# Patient Record
Sex: Female | Born: 1970
Health system: Southern US, Community
[De-identification: ages and names within clinical notes are randomized; demographics above are authoritative.]

## PROBLEM LIST (undated history)

## (undated) DIAGNOSIS — N871 Moderate cervical dysplasia: Secondary | ICD-10-CM

## (undated) DIAGNOSIS — H409 Unspecified glaucoma: Secondary | ICD-10-CM

## (undated) DIAGNOSIS — T783XXA Angioneurotic edema, initial encounter: Secondary | ICD-10-CM

## (undated) DIAGNOSIS — N951 Menopausal and female climacteric states: Secondary | ICD-10-CM

## (undated) DIAGNOSIS — T7840XA Allergy, unspecified, initial encounter: Secondary | ICD-10-CM

## (undated) DIAGNOSIS — T782XXA Anaphylactic shock, unspecified, initial encounter: Secondary | ICD-10-CM

## (undated) DIAGNOSIS — D219 Benign neoplasm of connective and other soft tissue, unspecified: Secondary | ICD-10-CM

## (undated) DIAGNOSIS — K219 Gastro-esophageal reflux disease without esophagitis: Secondary | ICD-10-CM

## (undated) DIAGNOSIS — E559 Vitamin D deficiency, unspecified: Secondary | ICD-10-CM

## (undated) DIAGNOSIS — L509 Urticaria, unspecified: Secondary | ICD-10-CM

## (undated) HISTORY — PX: DILATION AND CURETTAGE OF UTERUS: SHX78

## (undated) HISTORY — DX: Moderate cervical dysplasia: N87.1

## (undated) HISTORY — DX: Vitamin D deficiency, unspecified: E55.9

## (undated) HISTORY — DX: Unspecified glaucoma: H40.9

## (undated) HISTORY — DX: Urticaria, unspecified: L50.9

## (undated) HISTORY — DX: Anaphylactic shock, unspecified, initial encounter: T78.2XXA

## (undated) HISTORY — DX: Allergy, unspecified, initial encounter: T78.40XA

## (undated) HISTORY — DX: Angioneurotic edema, initial encounter: T78.3XXA

## (undated) HISTORY — DX: Benign neoplasm of connective and other soft tissue, unspecified: D21.9

## (undated) HISTORY — PX: OTHER SURGICAL HISTORY: SHX169

## (undated) HISTORY — DX: Menopausal and female climacteric states: N95.1

---

## 1997-11-26 ENCOUNTER — Inpatient Hospital Stay (HOSPITAL_COMMUNITY): Admission: AD | Admit: 1997-11-26 | Discharge: 1997-11-26 | Payer: Self-pay | Admitting: Obstetrics and Gynecology

## 1997-11-26 ENCOUNTER — Inpatient Hospital Stay (HOSPITAL_COMMUNITY): Admission: AD | Admit: 1997-11-26 | Discharge: 1997-11-29 | Payer: Self-pay | Admitting: Obstetrics and Gynecology

## 1999-10-02 HISTORY — PX: CERVICAL BIOPSY  W/ LOOP ELECTRODE EXCISION: SUR135

## 1999-11-07 ENCOUNTER — Encounter (INDEPENDENT_AMBULATORY_CARE_PROVIDER_SITE_OTHER): Payer: Self-pay | Admitting: Specialist

## 1999-11-07 ENCOUNTER — Other Ambulatory Visit: Admission: RE | Admit: 1999-11-07 | Discharge: 1999-11-07 | Payer: Self-pay | Admitting: *Deleted

## 2000-04-11 ENCOUNTER — Inpatient Hospital Stay (HOSPITAL_COMMUNITY): Admission: AD | Admit: 2000-04-11 | Discharge: 2000-04-13 | Payer: Self-pay | Admitting: Obstetrics and Gynecology

## 2000-07-23 ENCOUNTER — Encounter (INDEPENDENT_AMBULATORY_CARE_PROVIDER_SITE_OTHER): Payer: Self-pay | Admitting: Specialist

## 2000-07-23 ENCOUNTER — Other Ambulatory Visit: Admission: RE | Admit: 2000-07-23 | Discharge: 2000-07-23 | Payer: Self-pay | Admitting: *Deleted

## 2000-09-28 ENCOUNTER — Encounter (INDEPENDENT_AMBULATORY_CARE_PROVIDER_SITE_OTHER): Payer: Self-pay

## 2000-09-28 ENCOUNTER — Ambulatory Visit (HOSPITAL_COMMUNITY): Admission: RE | Admit: 2000-09-28 | Discharge: 2000-09-28 | Payer: Self-pay | Admitting: Obstetrics and Gynecology

## 2000-11-01 ENCOUNTER — Emergency Department (HOSPITAL_COMMUNITY): Admission: EM | Admit: 2000-11-01 | Discharge: 2000-11-01 | Payer: Self-pay | Admitting: Emergency Medicine

## 2001-02-26 ENCOUNTER — Other Ambulatory Visit: Admission: RE | Admit: 2001-02-26 | Discharge: 2001-02-26 | Payer: Self-pay | Admitting: Obstetrics and Gynecology

## 2001-08-20 ENCOUNTER — Other Ambulatory Visit: Admission: RE | Admit: 2001-08-20 | Discharge: 2001-08-20 | Payer: Self-pay | Admitting: Obstetrics and Gynecology

## 2002-05-04 ENCOUNTER — Other Ambulatory Visit: Admission: RE | Admit: 2002-05-04 | Discharge: 2002-05-04 | Payer: Self-pay | Admitting: Obstetrics and Gynecology

## 2002-12-09 ENCOUNTER — Other Ambulatory Visit: Admission: RE | Admit: 2002-12-09 | Discharge: 2002-12-09 | Payer: Self-pay | Admitting: Obstetrics and Gynecology

## 2003-03-09 ENCOUNTER — Other Ambulatory Visit: Admission: RE | Admit: 2003-03-09 | Discharge: 2003-03-09 | Payer: Self-pay | Admitting: Obstetrics and Gynecology

## 2003-06-09 ENCOUNTER — Other Ambulatory Visit: Admission: RE | Admit: 2003-06-09 | Discharge: 2003-06-09 | Payer: Self-pay | Admitting: Obstetrics and Gynecology

## 2003-07-02 ENCOUNTER — Encounter: Payer: Self-pay | Admitting: Internal Medicine

## 2003-07-02 ENCOUNTER — Encounter: Admission: RE | Admit: 2003-07-02 | Discharge: 2003-07-02 | Payer: Self-pay | Admitting: Internal Medicine

## 2003-09-23 ENCOUNTER — Emergency Department (HOSPITAL_COMMUNITY): Admission: AD | Admit: 2003-09-23 | Discharge: 2003-09-23 | Payer: Self-pay | Admitting: Emergency Medicine

## 2004-08-07 ENCOUNTER — Other Ambulatory Visit: Admission: RE | Admit: 2004-08-07 | Discharge: 2004-08-07 | Payer: Self-pay | Admitting: Obstetrics and Gynecology

## 2006-05-14 ENCOUNTER — Other Ambulatory Visit: Admission: RE | Admit: 2006-05-14 | Discharge: 2006-05-14 | Payer: Self-pay | Admitting: Obstetrics and Gynecology

## 2007-12-20 ENCOUNTER — Emergency Department (HOSPITAL_COMMUNITY): Admission: EM | Admit: 2007-12-20 | Discharge: 2007-12-20 | Payer: Self-pay | Admitting: Family Medicine

## 2009-03-28 ENCOUNTER — Encounter: Admission: RE | Admit: 2009-03-28 | Discharge: 2009-03-28 | Payer: Self-pay | Admitting: Internal Medicine

## 2009-08-09 ENCOUNTER — Ambulatory Visit: Payer: Self-pay | Admitting: Gynecology

## 2009-09-19 ENCOUNTER — Other Ambulatory Visit: Admission: RE | Admit: 2009-09-19 | Discharge: 2009-09-19 | Payer: Self-pay | Admitting: Obstetrics and Gynecology

## 2009-09-19 ENCOUNTER — Ambulatory Visit: Payer: Self-pay | Admitting: Obstetrics and Gynecology

## 2010-03-31 ENCOUNTER — Ambulatory Visit: Payer: Self-pay | Admitting: Gynecology

## 2010-04-25 ENCOUNTER — Ambulatory Visit: Payer: Self-pay | Admitting: Obstetrics and Gynecology

## 2010-06-08 ENCOUNTER — Emergency Department (HOSPITAL_COMMUNITY): Admission: EM | Admit: 2010-06-08 | Discharge: 2010-06-08 | Payer: Self-pay | Admitting: Emergency Medicine

## 2010-09-20 ENCOUNTER — Other Ambulatory Visit
Admission: RE | Admit: 2010-09-20 | Discharge: 2010-09-20 | Payer: Self-pay | Source: Home / Self Care | Admitting: Gynecology

## 2010-09-20 ENCOUNTER — Ambulatory Visit: Payer: Self-pay | Admitting: Gynecology

## 2010-10-22 ENCOUNTER — Encounter: Payer: Self-pay | Admitting: Internal Medicine

## 2010-12-14 LAB — CBC
MCV: 92.5 fL (ref 78.0–100.0)
Platelets: 190 10*3/uL (ref 150–400)
RBC: 4.16 MIL/uL (ref 3.87–5.11)
WBC: 6.1 10*3/uL (ref 4.0–10.5)

## 2010-12-14 LAB — POCT CARDIAC MARKERS
CKMB, poc: <1 ng/mL — ABNORMAL LOW (ref 1.0–8.0)
Myoglobin, poc: 41.1 ng/mL (ref 12–200)
Troponin i, poc: <0.05 ng/mL (ref 0.00–0.09)

## 2010-12-14 LAB — DIFFERENTIAL
Basophils Absolute: 0 10*3/uL (ref 0.0–0.1)
Eosinophils Relative: 1 % (ref 0–5)
Lymphocytes Relative: 57 % — ABNORMAL HIGH (ref 12–46)
Lymphs Abs: 3.5 10*3/uL (ref 0.7–4.0)
Neutro Abs: 2.2 10*3/uL (ref 1.7–7.7)
Neutrophils Relative %: 37 % — ABNORMAL LOW (ref 43–77)

## 2010-12-14 LAB — BASIC METABOLIC PANEL
Chloride: 105 mEq/L (ref 96–112)
Creatinine, Ser: 0.7 mg/dL (ref 0.4–1.2)
GFR calc Af Amer: >60 mL/min (ref >60)
GFR calc non Af Amer: >60 mL/min (ref >60)

## 2011-01-27 ENCOUNTER — Inpatient Hospital Stay (INDEPENDENT_AMBULATORY_CARE_PROVIDER_SITE_OTHER)
Admission: RE | Admit: 2011-01-27 | Discharge: 2011-01-27 | Disposition: A | Discharge status: Home / Self Care | Payer: BC Managed Care – PPO | Source: Ambulatory Visit | Attending: Emergency Medicine | Admitting: Emergency Medicine

## 2011-01-27 DIAGNOSIS — R071 Chest pain on breathing: Secondary | ICD-10-CM

## 2011-01-27 LAB — POCT I-STAT, CHEM 8
Calcium, Ion: 1.17 mmol/L (ref 1.12–1.32)
Hemoglobin: 15 g/dL (ref 12.0–15.0)
Sodium: 141 mEq/L (ref 135–145)
TCO2: 28 mmol/L (ref 0–100)

## 2011-02-16 NOTE — H&P (Signed)
[facility]  Patient:    Ashley Archer, Ashley Archer                        MRN: [id number] Adm. Date:  04/11/00 Attending:  Georgina Peer, M.D. Dictator:   Wynelle Bourgeois, C.N.M.                         History and Physical  SUBJECTIVE:                       This is a 40 year old, G3, P1-0-1-1, at 36-4/7 weeks who presented to the office today with complaints of irregular uterine contractions every 8 to 10 minutes.  She reports positive fetal movement and denies any bleeding or leaking.  Her cervical exam in the office was 5 to 6 cm, 80% effaced, -1 to 0 station, vertex presentation per Vance Gather Duplantis, C.N.M..  Her pregnancy has been remarkable for 1) History of 16-week SAB, 2) family history of Downs syndrome, 3) enlarged thyroid with a low first trimester TSH and a normal second trimester TSH, 4) low-grade SIL Pap followed by a colposcopy showing a high-grade SIL Pap, 5) group B strep negative.  She appears comfortable and declines analgesia at this time.  PRENATAL LABORATORY DATA:         Hemoglobin 12.8, hematocrit 39.5, platelets 238.  Blood type B positive.  Antibody screen negative.  Sickle cell negative. RPR nonreactive.  Rubella immune.  HBsAg negative.  Pap LGSIL.  Gonorrhea negative.  Chlamydia negative.  Glucose challenge within normal limits.  First trimester TSH 0.064.  T3 normal, T4 normal, and TSH on Feb 06, 2000, was 0.376.  Group B Strep negative.  OBSTETRICAL HISTORY:              Remarkable for spontaneous vaginal delivery in February 1999 of a viable female infant, 6 pounds 12 ounces, at [redacted] weeks gestation after 9 hours of labor with no complications by Nigel Bridgeman, C.N.M. In [place] 1995, she had a 16-week spontaneous miscarriage followed by a D&C at [facility] with no complications.  PAST MEDICAL HISTORY:             Remarkable for abnormal Pap smear in 1994 and again in 2001.  She has occasional yeast infections.  She has an  enlarged thyroid.  PAST SURGICAL HISTORY:            Remarkable for D&C in 1995, surgery on her thumb in 1989.  FAMILY HISTORY:                   Remarkable for her mother maternal grandmother, and maternal aunt x 2 with hypertension, a maternal aunt with insulin-dependent diabetes, and  a maternal aunt with migraines.  GENETIC HISTORY:                  Remarkable for a maternal first and third cousin with Downs syndrome.  SOCIAL HISTORY:                   The patient is married to [place].  She lives with him and their son, Meta Hatchet.  She works as an Advertising account planner, and Judie Grieve is involved and supportive.  She denies alcohol, tobacco, or drug use.  REVIEW OF SYSTEMS:                Essentially noncontributory other than reproductive system as commented  on earlier.  PHYSICAL EXAMINATION:  VITAL SIGNS:                      Stable.  She is afebrile.  SKIN:                             Within normal limits.  NEUROLOGIC:                       Within normal limits.  HEENT:                            Within normal limits.  NECK:                             Thyroid slightly enlarged 1 cm bilaterally.  BREASTS:                          Soft, nontender, no masses.  CARDIOVASCULAR:                   Regular rate and rhythm, no murmur.  RESPIRATORY:                      Clear to auscultation bilaterally.  ABDOMEN:                          Gravid at 36 cm, vertex per [place].  Fetal heart rate reactive with positive accelerations and average variability and no decelerations.  Uterine contractions every 2 minutes, moderate in strength.  CERVIX:                           Exam at this time is 6+ cm, completely effaced, 0 station, vertex presentation.  EXTREMITIES:                      Within normal limits.  ASSESSMENT:                       1. Intrauterine pregnancy at 36-4/7 weeks.                                   2. Active labor.                                   3. Group B  Streptococcus negative.  PLAN:                             1. Admit to birthing suite.                                   2. Dr. Elliot Gault notified.                                   3. Routine CNM orders.  4. Anticipate spontaneous vaginal delivery                                      this evening. DD:  04/11/00 TD:  04/11/00 Job: [id number]

## 2011-02-16 NOTE — Op Note (Signed)
[facility]  Patient:    Ashley Archer, Ashley Archer                      MRN: [id number] Proc. Date: 09/28/00 Adm. Date:  [id number] Attending:  Leonard Schwartz                           Operative Report  PREOPERATIVE DIAGNOSIS:       Cervical intraepithelial neoplasia II.  POSTOPERATIVE DIAGNOSIS:      Cervical intraepithelial neoplasia II.  PROCEDURE:                    Loop electrosurgical excision procedure.  SURGEON:                      Janine Limbo, M.D.  ANESTHESIA:                   Local.  INDICATIONS:                  The patient is a 40 year old with CIN-II.  She understands the indications for her procedure and she accepts the associated risks.  FINDINGS:                     The patient had white epithelium on the cervix.  DESCRIPTION OF PROCEDURE:     The patient was taken to the operating room, where she was placed in the lithotomy position.  The vagina and cervix were painted with acetic acid.  The cervix was injected with a diluted solution of 0.5% Marcaine, Pitressin and normal saline.  Using the green LEEP electrode, the lesion was outlined and then a cone-like specimen was obtained.  The patient tolerated her procedure well.  Hemostasis was achieved using the ball apparatus.  The periphery of the cone specimen was then cauterized using the ball apparatus.  Hemostasis was adequate.  The patient was taken to the recovery room.  Estimated blood loss was less than 5 cc.  FOLLOW-UP INSTRUCTIONS:       The patient will return to see Dr. Stefano Gaul in 2-3 weeks for follow-up examination.  She was given a copy of the postoperative instruction sheet as prepared by the [facility] for patients who had have a dilatation and curettage, but that was then modified for a LEEP procedure.  She will take Advil 600 mg q.6h. p.r.n. pain. DD:  09/28/00 TD:  09/28/00 Job: [id number]

## 2011-06-14 ENCOUNTER — Other Ambulatory Visit: Payer: Self-pay | Admitting: Internal Medicine

## 2011-06-14 DIAGNOSIS — Z1231 Encounter for screening mammogram for malignant neoplasm of breast: Secondary | ICD-10-CM

## 2011-07-18 ENCOUNTER — Ambulatory Visit
Admission: RE | Admit: 2011-07-18 | Discharge: 2011-07-18 | Disposition: A | Discharge status: Home / Self Care | Payer: BC Managed Care – PPO | Source: Ambulatory Visit | Attending: Internal Medicine | Admitting: Internal Medicine

## 2011-07-18 DIAGNOSIS — Z1231 Encounter for screening mammogram for malignant neoplasm of breast: Secondary | ICD-10-CM

## 2011-09-19 ENCOUNTER — Encounter: Payer: Self-pay | Admitting: Gynecology

## 2011-09-19 DIAGNOSIS — N871 Moderate cervical dysplasia: Secondary | ICD-10-CM | POA: Insufficient documentation

## 2011-09-19 DIAGNOSIS — D219 Benign neoplasm of connective and other soft tissue, unspecified: Secondary | ICD-10-CM | POA: Insufficient documentation

## 2011-09-27 ENCOUNTER — Encounter: Payer: Self-pay | Admitting: Obstetrics and Gynecology

## 2011-09-27 ENCOUNTER — Ambulatory Visit (INDEPENDENT_AMBULATORY_CARE_PROVIDER_SITE_OTHER): Payer: BC Managed Care – PPO | Admitting: Obstetrics and Gynecology

## 2011-09-27 ENCOUNTER — Other Ambulatory Visit (HOSPITAL_COMMUNITY)
Admission: RE | Admit: 2011-09-27 | Discharge: 2011-09-27 | Disposition: A | Discharge status: Home / Self Care | Payer: BC Managed Care – PPO | Source: Ambulatory Visit | Attending: Obstetrics and Gynecology | Admitting: Obstetrics and Gynecology

## 2011-09-27 VITALS — BP 126/84 | Ht 61.75 in | Wt 145.0 lb

## 2011-09-27 DIAGNOSIS — Z23 Encounter for immunization: Secondary | ICD-10-CM

## 2011-09-27 DIAGNOSIS — Z01419 Encounter for gynecological examination (general) (routine) without abnormal findings: Secondary | ICD-10-CM

## 2011-09-27 NOTE — Progress Notes (Signed)
Patient came to see me today for her annual GYN exam. She is done her lab work from her PCP. She had a mammogram in October. She is contracepting with a condom but does not like it. She was not happy on birth control pills. She would like her husband to get a vasectomy but he has refused. She is having very heavy periods with cramps with her fibroids. She is having no pelvic pain. She is having no abnormal bleeding.  Physical examination: Beola Cord present. HEENT within normal limits. Neck: Thyroid not large. No masses. Supraclavicular nodes: not enlarged. Breasts: Examined in both sitting midline position. No skin changes and no masses. Abdomen: Soft no guarding rebound or masses or hernia. Pelvic: External: Within normal limits. BUS: Within normal limits. Vaginal:within normal limits. Good estrogen effect. No evidence of cystocele rectocele or enterocele. Cervix: clean. Uterus: enlarged by fibroids to 8 weeks size. Adnexa: No masses. Rectovaginal exam: Confirmatory and negative. Extremities: Within normal limits.  Assessment: #1. Menorrhagia #2. Fibroids #3. Need for contraception.  Plan: Encouraged her to try a Mirena IUD. Discussed Implanon or lap tubal. Patient will consider and inform if she would like to be approved for Mirena IUD. Also discussed endometrial ablation but obviously that we'll not solve the issue with contraception.

## 2011-10-16 ENCOUNTER — Encounter: Payer: Self-pay | Admitting: *Deleted

## 2011-10-16 NOTE — Progress Notes (Signed)
Patient ID: Ashley Archer, female   DOB: 11-Jun-1971, 41 y.o.   MRN: [id number] Pt informed of recent pap result in dec 2012.

## 2011-10-24 ENCOUNTER — Other Ambulatory Visit: Payer: Self-pay | Admitting: Internal Medicine

## 2011-10-24 DIAGNOSIS — R109 Unspecified abdominal pain: Secondary | ICD-10-CM

## 2011-10-25 ENCOUNTER — Other Ambulatory Visit: Payer: BC Managed Care – PPO

## 2011-10-25 ENCOUNTER — Ambulatory Visit
Admission: RE | Admit: 2011-10-25 | Discharge: 2011-10-25 | Disposition: A | Discharge status: Home / Self Care | Payer: BC Managed Care – PPO | Source: Ambulatory Visit | Attending: Internal Medicine | Admitting: Internal Medicine

## 2011-10-25 DIAGNOSIS — R109 Unspecified abdominal pain: Secondary | ICD-10-CM

## 2012-06-26 ENCOUNTER — Other Ambulatory Visit: Payer: Self-pay | Admitting: Internal Medicine

## 2012-06-26 DIAGNOSIS — Z1231 Encounter for screening mammogram for malignant neoplasm of breast: Secondary | ICD-10-CM

## 2012-07-29 ENCOUNTER — Telehealth: Payer: Self-pay | Admitting: *Deleted

## 2012-07-29 NOTE — Telephone Encounter (Signed)
Pt calling c/o bump on lower back area at tailbone area. Pt said it is tender, was a bump, but now bump is gone and still feels tender. OV offered to pt she will watch for now. No discharge for area, no is this area red. Pt will follow up as needed.

## 2012-07-30 ENCOUNTER — Encounter: Payer: Self-pay | Admitting: Gynecology

## 2012-07-30 ENCOUNTER — Ambulatory Visit (INDEPENDENT_AMBULATORY_CARE_PROVIDER_SITE_OTHER): Payer: BC Managed Care – PPO | Admitting: Gynecology

## 2012-07-30 DIAGNOSIS — Z23 Encounter for immunization: Secondary | ICD-10-CM

## 2012-07-30 DIAGNOSIS — L0591 Pilonidal cyst without abscess: Secondary | ICD-10-CM

## 2012-07-30 MED ORDER — FLUCONAZOLE 150 MG PO TABS: 150.0000 mg | ORAL_TABLET | Freq: Once | ORAL | Status: DC

## 2012-07-30 MED ORDER — DOXYCYCLINE HYCLATE 50 MG PO CAPS: 50.0000 mg | ORAL_CAPSULE | Freq: Two times a day (BID) | ORAL | Status: DC

## 2012-07-30 NOTE — Patient Instructions (Signed)
Take antibiotics as prescribed.  Heat to the area.  Call if persists, worsens or recurs for referral to general surgeon

## 2012-07-30 NOTE — Progress Notes (Signed)
Patient presents with a several day to one week history of tenderness and swelling at the upper gluteal fold. No history of this before. Does seem to be getting better the last day or so. Has not drained. No constitutional symptoms such as fever chills. Is having a little bit of vaginal itching and think she has a yeast infection.  Exam with Sherrilyn Rist assistant Tender 1 cm subcutaneous nodule upper mid gluteal fold overlying sacrum with small sebaceous opening visualized.  No drainage or skin ulcerations or erythema.  Assessment and plan: Probable small pilonidal cyst. Getting better historically. Will cover with Vibramycin 100 mg twice a day x7 days antibiotics and recommend heat to the area. Will also cover for yeast with Diflucan #2 one now and at the end of the Vibramycin. Possible sebaceous cyst. If persists, worsens or becomes recurrent patient knows to call me and I will refer to general surgeon for evaluation and possible excision.

## 2012-07-30 NOTE — Addendum Note (Signed)
Addended by: Richardson Chiquito on: 07/30/2012 10:40 AM   Modules accepted: Orders

## 2012-08-04 ENCOUNTER — Ambulatory Visit
Admission: RE | Admit: 2012-08-04 | Discharge: 2012-08-04 | Disposition: A | Discharge status: Home / Self Care | Payer: BC Managed Care – PPO | Source: Ambulatory Visit | Attending: Internal Medicine | Admitting: Internal Medicine

## 2012-08-04 DIAGNOSIS — Z1231 Encounter for screening mammogram for malignant neoplasm of breast: Secondary | ICD-10-CM

## 2012-08-12 ENCOUNTER — Ambulatory Visit: Payer: BC Managed Care – PPO

## 2012-09-29 ENCOUNTER — Ambulatory Visit (INDEPENDENT_AMBULATORY_CARE_PROVIDER_SITE_OTHER): Payer: BC Managed Care – PPO | Admitting: Obstetrics and Gynecology

## 2012-09-29 ENCOUNTER — Other Ambulatory Visit (HOSPITAL_COMMUNITY)
Admission: RE | Admit: 2012-09-29 | Discharge: 2012-09-29 | Disposition: A | Discharge status: Home / Self Care | Payer: BC Managed Care – PPO | Source: Ambulatory Visit | Attending: Obstetrics and Gynecology | Admitting: Obstetrics and Gynecology

## 2012-09-29 ENCOUNTER — Encounter: Payer: Self-pay | Admitting: Obstetrics and Gynecology

## 2012-09-29 VITALS — BP 112/74 | Ht 62.0 in | Wt 128.0 lb

## 2012-09-29 DIAGNOSIS — Z01419 Encounter for gynecological examination (general) (routine) without abnormal findings: Secondary | ICD-10-CM

## 2012-09-29 NOTE — Patient Instructions (Signed)
Continue yearly mammograms 

## 2012-09-29 NOTE — Progress Notes (Signed)
The patient came to see me today for her annual GYN exam. She is using condoms for birth control. We have discussed other methods in the past and she is still deciding. One of the methods was an IUD especially with her heavy cycles. She is having no abnormal bleeding or pelvic pain. She does her lab through her PCP. She had a LEEP in 2001 for CIN 2. She has had normal yearly Pap smears since then. Her last Pap smear was 2012. She has a history of a small fibroid uterus.  Physical examination:Ashley Archer present HEENT within normal limits. Neck: Thyroid not large. No masses. Supraclavicular nodes: not enlarged. Breasts: Examined in both sitting and lying  position. No skin changes and no masses. Abdomen: Soft no guarding rebound or masses or hernia. Pelvic: External: Within normal limits. BUS: Within normal limits. Vaginal:within normal limits. Good estrogen effect. No evidence of cystocele rectocele or enterocele. Cervix: clean. Uterus: 8 week fibroid uterus. Adnexa: No masses. Rectovaginal exam: Confirmatory and negative. Extremities: Within normal limits.  Assessment: #1. Fibroid uterus with heavy cycles. #2. History of CIN-2 with a LEEP in 2001.  Plan: Continue yearly mammograms.The new Pap smear guidelines were discussed with the patient. Patient requested Pap and it was done. She will inform when necessary new method of birth control.

## 2012-09-30 LAB — URINALYSIS W MICROSCOPIC + REFLEX CULTURE
Hgb urine dipstick: NEGATIVE
Leukocytes, UA: NEGATIVE
Protein, ur: NEGATIVE mg/dL
Squamous Epithelial / LPF: NONE SEEN
Urobilinogen, UA: 0.2 mg/dL (ref 0.0–1.0)

## 2012-12-29 ENCOUNTER — Encounter: Payer: Self-pay | Admitting: Gastroenterology

## 2013-01-26 ENCOUNTER — Ambulatory Visit: Payer: BC Managed Care – PPO | Admitting: Gastroenterology

## 2013-06-08 ENCOUNTER — Encounter (HOSPITAL_COMMUNITY): Payer: Self-pay | Admitting: Emergency Medicine

## 2013-06-08 ENCOUNTER — Emergency Department (HOSPITAL_COMMUNITY)
Admission: EM | Admit: 2013-06-08 | Discharge: 2013-06-08 | Disposition: A | Discharge status: Home / Self Care | Payer: BC Managed Care – PPO | Attending: Emergency Medicine | Admitting: Emergency Medicine

## 2013-06-08 DIAGNOSIS — Z3202 Encounter for pregnancy test, result negative: Secondary | ICD-10-CM | POA: Insufficient documentation

## 2013-06-08 DIAGNOSIS — R1013 Epigastric pain: Secondary | ICD-10-CM

## 2013-06-08 DIAGNOSIS — Z8542 Personal history of malignant neoplasm of other parts of uterus: Secondary | ICD-10-CM | POA: Insufficient documentation

## 2013-06-08 DIAGNOSIS — Z79899 Other long term (current) drug therapy: Secondary | ICD-10-CM | POA: Insufficient documentation

## 2013-06-08 DIAGNOSIS — K219 Gastro-esophageal reflux disease without esophagitis: Secondary | ICD-10-CM | POA: Insufficient documentation

## 2013-06-08 HISTORY — DX: Gastro-esophageal reflux disease without esophagitis: K21.9

## 2013-06-08 LAB — COMPREHENSIVE METABOLIC PANEL
ALT: 16 U/L (ref 0–35)
Alkaline Phosphatase: 44 U/L (ref 39–117)
BUN: 18 mg/dL (ref 6–23)
CO2: 26 mEq/L (ref 19–32)
GFR calc Af Amer: >90 mL/min (ref >90)
GFR calc non Af Amer: 86 mL/min — ABNORMAL LOW (ref >90)
Glucose, Bld: 101 mg/dL — ABNORMAL HIGH (ref 70–99)
Potassium: 3.6 mEq/L (ref 3.5–5.1)
Sodium: 137 mEq/L (ref 135–145)
Total Bilirubin: 0.6 mg/dL (ref 0.3–1.2)

## 2013-06-08 LAB — CBC WITH DIFFERENTIAL/PLATELET
Eosinophils Absolute: 0.1 10*3/uL (ref 0.0–0.7)
Hemoglobin: 14 g/dL (ref 12.0–15.0)
Lymphocytes Relative: 53 % — ABNORMAL HIGH (ref 12–46)
Lymphs Abs: 3.1 10*3/uL (ref 0.7–4.0)
MCH: 30.9 pg (ref 26.0–34.0)
MCV: 90.9 fL (ref 78.0–100.0)
Monocytes Relative: 8 % (ref 3–12)
Neutrophils Relative %: 37 % — ABNORMAL LOW (ref 43–77)
Platelets: 188 10*3/uL (ref 150–400)
RBC: 4.53 MIL/uL (ref 3.87–5.11)
WBC: 5.8 10*3/uL (ref 4.0–10.5)

## 2013-06-08 LAB — URINALYSIS, ROUTINE W REFLEX MICROSCOPIC
Hgb urine dipstick: NEGATIVE
Nitrite: NEGATIVE
Specific Gravity, Urine: 1.021 (ref 1.005–1.030)
Urobilinogen, UA: 0.2 mg/dL (ref 0.0–1.0)
pH: 6 (ref 5.0–8.0)

## 2013-06-08 LAB — POCT I-STAT TROPONIN I: Troponin i, poc: 0 ng/mL (ref 0.00–0.08)

## 2013-06-08 LAB — POCT PREGNANCY, URINE: Preg Test, Ur: NEGATIVE

## 2013-06-08 LAB — LIPASE, BLOOD: Lipase: 25 U/L (ref 11–59)

## 2013-06-08 MED ORDER — ONDANSETRON HCL 4 MG/2ML IJ SOLN
4.0000 mg | Freq: Once | INTRAMUSCULAR | Status: AC
  Administered 2013-06-08: 4 mg via INTRAVENOUS
  Filled 2013-06-08: qty 2

## 2013-06-08 MED ORDER — RANITIDINE HCL 150 MG PO CAPS: 150.0000 mg | ORAL_CAPSULE | Freq: Every day | ORAL | Status: DC

## 2013-06-08 NOTE — ED Provider Notes (Signed)
CSN: [id number]     Arrival date & time 06/08/13  0436 History   First MD Initiated Contact with Patient 06/08/13 [phone]     Chief Complaint  Patient presents with  . Abdominal Pain   (Consider location/radiation/quality/duration/timing/severity/associated sxs/prior Treatment) Patient is a 42 y.o. female presenting with abdominal pain.  Abdominal Pain Pain location:  Epigastric Pain quality: aching and shooting   Pain radiates to:  Back Pain severity:  Moderate Onset quality:  Unable to specify Duration:  1 week Timing:  Constant Progression:  Waxing and waning Relieved by:  Nothing Worsened by:  Nothing tried Ineffective treatments:  None tried Associated symptoms: diarrhea and nausea   Associated symptoms: no anorexia, no chest pain, no chills, no constipation, no cough, no dysuria, no fatigue, no fever, no shortness of breath, no sore throat, no vaginal bleeding, no vaginal discharge and no vomiting   Diarrhea:    Quality:  Semi-solid   Number of occurrences:  1   Severity:  Mild   Diarrhea duration: yesterday.   Timing:  Rare   Progression:  Resolved Risk factors: no alcohol abuse, no aspirin use, not elderly, has not had multiple surgeries, no NSAID use, not obese, not pregnant and no recent hospitalization     Past Medical History  Diagnosis Date  . CIN II (cervical intraepithelial neoplasia II)   . Fibroid   . GERD (gastroesophageal reflux disease)    Past Surgical History  Procedure Laterality Date  . Dilation and curettage of uterus    . Thumb surg    . Cervical biopsy  w/ loop electrode excision  2001    CIN ll   Family History  Problem Relation Age of Onset  . Hypertension Mother   . Cancer Mother 38    cervical  . Pancreatic cancer Father   . Cancer Father 68    pancreatic  . Diabetes Maternal Aunt   . Diabetes Maternal Grandmother    History  Substance Use Topics  . Smoking status: Never Smoker   . Smokeless tobacco: Never Used  . Alcohol Use: No    OB History   Grav Para Term Preterm Abortions TAB SAB Ect Mult Living   3 2 2  1  1   2      Review of Systems  Constitutional: Negative for fever, chills, diaphoresis, activity change, appetite change and fatigue.  HENT: Negative for congestion, sore throat, facial swelling, rhinorrhea, neck pain and neck stiffness.   Eyes: Negative for photophobia and discharge.  Respiratory: Negative for cough, chest tightness and shortness of breath.   Cardiovascular: Negative for chest pain, palpitations and leg swelling.  Gastrointestinal: Positive for nausea, abdominal pain and diarrhea. Negative for vomiting, constipation and anorexia.  Endocrine: Negative for polydipsia and polyuria.  Genitourinary: Negative for dysuria, frequency, vaginal bleeding, vaginal discharge, difficulty urinating and pelvic pain.  Musculoskeletal: Negative for back pain and arthralgias.  Skin: Negative for color change and wound.  Allergic/Immunologic: Negative for immunocompromised state.  Neurological: Negative for facial asymmetry, weakness, numbness and headaches.  Hematological: Does not bruise/bleed easily.  Psychiatric/Behavioral: Negative for confusion and agitation.    Allergies  Hydrocodone  Home Medications   Current Outpatient Rx  Name  Route  Sig  Dispense  Refill  . bismuth subsalicylate (PEPTO BISMOL) 262 MG/15ML suspension   Oral   Take 15 mLs by mouth every 6 (six) hours as needed for indigestion.         Marland Kitchen omeprazole (PRILOSEC) 20 MG capsule  Oral   Take 20 mg by mouth daily.         . ranitidine (ZANTAC) 150 MG capsule   Oral   Take 1 capsule (150 mg total) by mouth daily.   30 capsule   0    BP 116/71  Pulse 54  Temp(Src) 97.8 F (36.6 C) (Oral)  Resp 16  SpO2 100%  LMP 05/30/2013 Physical Exam  Constitutional: She is oriented to person, place, and time. She appears well-developed and well-nourished. No distress.  HENT:  Head: Normocephalic and atraumatic.   Mouth/Throat: No oropharyngeal exudate.  Eyes: Pupils are equal, round, and reactive to light.  Neck: Normal range of motion. Neck supple.  Cardiovascular: Normal rate, regular rhythm and normal heart sounds.  Exam reveals no gallop and no friction rub.   No murmur heard. Pulmonary/Chest: Effort normal and breath sounds normal. No respiratory distress. She has no wheezes. She has no rales.  Abdominal: Soft. Bowel sounds are normal. She exhibits no distension and no mass. There is tenderness (mild) in the epigastric area. There is no rigidity, no rebound and no guarding.  Musculoskeletal: Normal range of motion. She exhibits no edema and no tenderness.  Neurological: She is alert and oriented to person, place, and time.  Skin: Skin is warm and dry.  Psychiatric: She has a normal mood and affect.    ED Course  Procedures (including critical care time) Labs Review Labs Reviewed  CBC WITH DIFFERENTIAL - Abnormal; Notable for the following:    Neutrophils Relative % 37 (*)    Lymphocytes Relative 53 (*)    All other components within normal limits  COMPREHENSIVE METABOLIC PANEL - Abnormal; Notable for the following:    Glucose, Bld 101 (*)    GFR calc non Af Amer 86 (*)    All other components within normal limits  LIPASE, BLOOD  URINALYSIS, ROUTINE W REFLEX MICROSCOPIC  POCT PREGNANCY, URINE  POCT I-STAT TROPONIN I   Imaging Review No results found.   Date: 06/08/2013  Rate: 51  Rhythm: sinus bradycardia  QRS Axis: normal  Intervals: normal  ST/T Wave abnormalities: normal  Conduction Disutrbances:none  Narrative Interpretation:   Old EKG Reviewed: unchanged    MDM   1. Epigastric abdominal pain    Pt is a 42 y.o. female with Pmhx as above who presents with 4 days of epigastric pain w/ radiation to the back, worse early this morning with assoc nausea, no vom, fever, one episode of d/a yesterday. Unchanged w/ food.  Pt feels somewhat improved after zofran, has benign  abdominal exam, CBC, CMP, lipase unremarkable.  I added EKG, itrop trop given sex, and were also unremarkable.  I doubt cholecystitis, SBO, appendicitis, ACS.  Pt has have gastritis/PUD.  Will recommend addition of PPI.  Return precautions given for new or worsening symptoms, she can otherwise f/u with her PCP.   1. Epigastric abdominal pain         Shanna Cisco, MD 06/08/13 2053

## 2013-06-08 NOTE — ED Notes (Signed)
Pt. woke up from sleep with upper abdominal pain this morning , denies nausea or vomitting . Diarrhea last night . Pain radiating to mid /lower back , denies urinary discomfort , no fever or chills.

## 2013-07-08 ENCOUNTER — Other Ambulatory Visit: Payer: Self-pay

## 2013-07-08 DIAGNOSIS — Z1231 Encounter for screening mammogram for malignant neoplasm of breast: Secondary | ICD-10-CM

## 2013-09-02 ENCOUNTER — Ambulatory Visit
Admission: RE | Admit: 2013-09-02 | Discharge: 2013-09-02 | Disposition: A | Discharge status: Home / Self Care | Payer: BC Managed Care – PPO | Source: Ambulatory Visit

## 2013-09-02 DIAGNOSIS — Z1231 Encounter for screening mammogram for malignant neoplasm of breast: Secondary | ICD-10-CM

## 2013-10-15 ENCOUNTER — Other Ambulatory Visit (HOSPITAL_COMMUNITY): Payer: Self-pay | Admitting: Obstetrics and Gynecology

## 2013-10-15 DIAGNOSIS — E049 Nontoxic goiter, unspecified: Secondary | ICD-10-CM

## 2013-10-22 ENCOUNTER — Ambulatory Visit (HOSPITAL_COMMUNITY): Admission: RE | Admit: 2013-10-22 | Payer: BC Managed Care – PPO | Source: Ambulatory Visit

## 2014-07-27 ENCOUNTER — Other Ambulatory Visit: Payer: Self-pay

## 2014-07-27 DIAGNOSIS — Z1231 Encounter for screening mammogram for malignant neoplasm of breast: Secondary | ICD-10-CM

## 2014-08-02 ENCOUNTER — Encounter (HOSPITAL_COMMUNITY): Payer: Self-pay | Admitting: Emergency Medicine

## 2014-09-06 ENCOUNTER — Ambulatory Visit
Admission: RE | Admit: 2014-09-06 | Discharge: 2014-09-06 | Disposition: A | Discharge status: Home / Self Care | Payer: BC Managed Care – PPO | Source: Ambulatory Visit

## 2014-09-06 DIAGNOSIS — Z1231 Encounter for screening mammogram for malignant neoplasm of breast: Secondary | ICD-10-CM

## 2014-10-19 ENCOUNTER — Other Ambulatory Visit: Payer: Self-pay | Admitting: Internal Medicine

## 2014-10-19 DIAGNOSIS — R51 Headache: Principal | ICD-10-CM

## 2014-10-19 DIAGNOSIS — R519 Headache, unspecified: Secondary | ICD-10-CM

## 2015-06-27 ENCOUNTER — Other Ambulatory Visit: Payer: Self-pay | Admitting: Internal Medicine

## 2015-06-27 DIAGNOSIS — E010 Iodine-deficiency related diffuse (endemic) goiter: Secondary | ICD-10-CM

## 2015-07-01 ENCOUNTER — Ambulatory Visit
Admission: RE | Admit: 2015-07-01 | Discharge: 2015-07-01 | Disposition: A | Discharge status: Home / Self Care | Payer: 59 | Source: Ambulatory Visit | Attending: Internal Medicine | Admitting: Internal Medicine

## 2015-07-01 DIAGNOSIS — E010 Iodine-deficiency related diffuse (endemic) goiter: Secondary | ICD-10-CM

## 2016-02-17 ENCOUNTER — Other Ambulatory Visit: Payer: Self-pay

## 2016-02-17 DIAGNOSIS — Z1231 Encounter for screening mammogram for malignant neoplasm of breast: Secondary | ICD-10-CM

## 2016-03-02 ENCOUNTER — Ambulatory Visit: Payer: 59

## 2016-04-06 ENCOUNTER — Ambulatory Visit: Payer: 59

## 2016-06-15 ENCOUNTER — Ambulatory Visit: Payer: 59

## 2016-06-15 ENCOUNTER — Ambulatory Visit
Admission: RE | Admit: 2016-06-15 | Discharge: 2016-06-15 | Disposition: A | Discharge status: Home / Self Care | Payer: 59 | Source: Ambulatory Visit

## 2016-06-15 DIAGNOSIS — Z1231 Encounter for screening mammogram for malignant neoplasm of breast: Secondary | ICD-10-CM

## 2016-07-25 ENCOUNTER — Ambulatory Visit (HOSPITAL_COMMUNITY)
Admission: EM | Admit: 2016-07-25 | Discharge: 2016-07-25 | Disposition: A | Discharge status: Nursing Home (Includes ICF) | Payer: 59

## 2016-07-25 ENCOUNTER — Emergency Department (HOSPITAL_COMMUNITY): Payer: 59

## 2016-07-25 ENCOUNTER — Encounter (HOSPITAL_COMMUNITY): Payer: Self-pay | Admitting: Emergency Medicine

## 2016-07-25 ENCOUNTER — Emergency Department (HOSPITAL_COMMUNITY)
Admission: EM | Admit: 2016-07-25 | Discharge: 2016-07-25 | Disposition: A | Discharge status: Home / Self Care | Payer: 59 | Attending: Emergency Medicine | Admitting: Emergency Medicine

## 2016-07-25 DIAGNOSIS — R079 Chest pain, unspecified: Secondary | ICD-10-CM | POA: Diagnosis present

## 2016-07-25 DIAGNOSIS — Y999 Unspecified external cause status: Secondary | ICD-10-CM | POA: Insufficient documentation

## 2016-07-25 DIAGNOSIS — R51 Headache: Secondary | ICD-10-CM | POA: Diagnosis not present

## 2016-07-25 DIAGNOSIS — Y92410 Unspecified street and highway as the place of occurrence of the external cause: Secondary | ICD-10-CM | POA: Diagnosis not present

## 2016-07-25 DIAGNOSIS — S20212A Contusion of left front wall of thorax, initial encounter: Secondary | ICD-10-CM | POA: Diagnosis not present

## 2016-07-25 DIAGNOSIS — Y9389 Activity, other specified: Secondary | ICD-10-CM | POA: Insufficient documentation

## 2016-07-25 DIAGNOSIS — Z79899 Other long term (current) drug therapy: Secondary | ICD-10-CM | POA: Insufficient documentation

## 2016-07-25 MED ORDER — TRAMADOL HCL 50 MG PO TABS: 50.0000 mg | ORAL_TABLET | Freq: Four times a day (QID) | ORAL | 0 refills | Status: DC | PRN

## 2016-07-25 MED ORDER — IBUPROFEN 200 MG PO TABS
600.0000 mg | ORAL_TABLET | Freq: Once | ORAL | Status: AC
  Administered 2016-07-25: 600 mg via ORAL
  Filled 2016-07-25: qty 3

## 2016-07-25 MED ORDER — METHOCARBAMOL 500 MG PO TABS: 500.0000 mg | ORAL_TABLET | Freq: Two times a day (BID) | ORAL | 0 refills | Status: DC

## 2016-07-25 MED ORDER — IBUPROFEN 600 MG PO TABS: 600.0000 mg | ORAL_TABLET | Freq: Four times a day (QID) | ORAL | 0 refills | Status: DC | PRN

## 2016-07-25 NOTE — ED Notes (Signed)
Pt ambulated to restroom. 
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
## 2016-07-25 NOTE — Discharge Instructions (Signed)
Take your medications as prescribed as needed for pain relief. I also recommend applying ice to affected area for 15 minutes 3-4 times daily for additional pain relief. Follow-up with your primary care provider in the next week if your symptoms have not improved. Please return to the Emergency Department if symptoms worsen or new onset of fever, neck stiffness, visual changes, light sensitivity, abdominal pain, vomiting, urinary symptoms, numbness, tingling, weakness, seizures, syncope.
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
## 2016-12-31 ENCOUNTER — Other Ambulatory Visit: Payer: Self-pay | Admitting: Obstetrics and Gynecology

## 2016-12-31 DIAGNOSIS — E049 Nontoxic goiter, unspecified: Secondary | ICD-10-CM

## 2017-01-07 ENCOUNTER — Other Ambulatory Visit: Payer: 59

## 2017-01-11 ENCOUNTER — Other Ambulatory Visit: Payer: 59

## 2017-04-09 ENCOUNTER — Other Ambulatory Visit: Payer: 59

## 2017-04-19 ENCOUNTER — Other Ambulatory Visit: Payer: 59

## 2017-06-10 ENCOUNTER — Other Ambulatory Visit: Payer: Self-pay | Admitting: Obstetrics and Gynecology

## 2017-06-10 DIAGNOSIS — R109 Unspecified abdominal pain: Secondary | ICD-10-CM

## 2017-06-12 ENCOUNTER — Ambulatory Visit
Admission: RE | Admit: 2017-06-12 | Discharge: 2017-06-12 | Disposition: A | Discharge status: Home / Self Care | Payer: Managed Care, Other (non HMO) | Source: Ambulatory Visit | Attending: Obstetrics and Gynecology | Admitting: Obstetrics and Gynecology

## 2017-06-12 DIAGNOSIS — R109 Unspecified abdominal pain: Secondary | ICD-10-CM

## 2017-06-14 ENCOUNTER — Other Ambulatory Visit: Payer: 59

## 2017-06-19 ENCOUNTER — Other Ambulatory Visit: Payer: Self-pay | Admitting: Obstetrics and Gynecology

## 2017-06-19 DIAGNOSIS — R599 Enlarged lymph nodes, unspecified: Secondary | ICD-10-CM

## 2017-06-20 ENCOUNTER — Other Ambulatory Visit: Payer: Managed Care, Other (non HMO)

## 2017-07-05 DIAGNOSIS — N200 Calculus of kidney: Secondary | ICD-10-CM | POA: Diagnosis not present

## 2017-09-12 DIAGNOSIS — R1909 Other intra-abdominal and pelvic swelling, mass and lump: Secondary | ICD-10-CM | POA: Diagnosis not present

## 2017-11-15 DIAGNOSIS — H401131 Primary open-angle glaucoma, bilateral, mild stage: Secondary | ICD-10-CM | POA: Diagnosis not present

## 2017-11-21 ENCOUNTER — Other Ambulatory Visit: Payer: Self-pay | Admitting: General Surgery

## 2017-11-21 DIAGNOSIS — R1909 Other intra-abdominal and pelvic swelling, mass and lump: Secondary | ICD-10-CM

## 2017-11-22 ENCOUNTER — Ambulatory Visit
Admission: RE | Admit: 2017-11-22 | Discharge: 2017-11-22 | Disposition: A | Discharge status: Home / Self Care | Payer: 59 | Source: Ambulatory Visit | Attending: Obstetrics and Gynecology | Admitting: Obstetrics and Gynecology

## 2017-11-22 DIAGNOSIS — E010 Iodine-deficiency related diffuse (endemic) goiter: Secondary | ICD-10-CM | POA: Diagnosis not present

## 2017-11-22 DIAGNOSIS — E049 Nontoxic goiter, unspecified: Secondary | ICD-10-CM

## 2017-11-26 DIAGNOSIS — Z6830 Body mass index (BMI) 30.0-30.9, adult: Secondary | ICD-10-CM | POA: Diagnosis not present

## 2017-11-26 DIAGNOSIS — J029 Acute pharyngitis, unspecified: Secondary | ICD-10-CM | POA: Diagnosis not present

## 2017-11-26 DIAGNOSIS — J329 Chronic sinusitis, unspecified: Secondary | ICD-10-CM | POA: Diagnosis not present

## 2017-11-26 DIAGNOSIS — R05 Cough: Secondary | ICD-10-CM | POA: Diagnosis not present

## 2017-12-02 ENCOUNTER — Other Ambulatory Visit: Payer: Self-pay

## 2017-12-05 ENCOUNTER — Other Ambulatory Visit: Payer: Self-pay

## 2017-12-12 DIAGNOSIS — L508 Other urticaria: Secondary | ICD-10-CM | POA: Diagnosis not present

## 2017-12-12 DIAGNOSIS — Z6830 Body mass index (BMI) 30.0-30.9, adult: Secondary | ICD-10-CM | POA: Diagnosis not present

## 2017-12-12 DIAGNOSIS — R21 Rash and other nonspecific skin eruption: Secondary | ICD-10-CM | POA: Diagnosis not present

## 2017-12-12 DIAGNOSIS — J029 Acute pharyngitis, unspecified: Secondary | ICD-10-CM | POA: Diagnosis not present

## 2017-12-12 DIAGNOSIS — R030 Elevated blood-pressure reading, without diagnosis of hypertension: Secondary | ICD-10-CM | POA: Diagnosis not present

## 2017-12-13 ENCOUNTER — Encounter (HOSPITAL_COMMUNITY): Payer: Self-pay | Admitting: Emergency Medicine

## 2017-12-13 ENCOUNTER — Ambulatory Visit (HOSPITAL_COMMUNITY)
Admission: EM | Admit: 2017-12-13 | Discharge: 2017-12-13 | Disposition: A | Discharge status: Home / Self Care | Payer: 59 | Source: Home / Self Care | Attending: Emergency Medicine | Admitting: Emergency Medicine

## 2017-12-13 ENCOUNTER — Emergency Department (HOSPITAL_COMMUNITY)
Admission: EM | Admit: 2017-12-13 | Discharge: 2017-12-13 | Disposition: A | Discharge status: Home / Self Care | Payer: 59 | Attending: Emergency Medicine | Admitting: Emergency Medicine

## 2017-12-13 DIAGNOSIS — T782XXA Anaphylactic shock, unspecified, initial encounter: Secondary | ICD-10-CM | POA: Diagnosis not present

## 2017-12-13 DIAGNOSIS — Z79899 Other long term (current) drug therapy: Secondary | ICD-10-CM | POA: Insufficient documentation

## 2017-12-13 LAB — I-STAT BETA HCG BLOOD, ED (MC, WL, AP ONLY): I-stat hCG, quantitative: <5 m[IU]/mL (ref <5)

## 2017-12-13 MED ORDER — DIPHENHYDRAMINE HCL 50 MG/ML IJ SOLN
25.0000 mg | Freq: Once | INTRAMUSCULAR | Status: AC
  Administered 2017-12-13: 25 mg via INTRAVENOUS
  Filled 2017-12-13: qty 1

## 2017-12-13 MED ORDER — PREDNISONE 20 MG PO TABS
ORAL_TABLET | ORAL | Status: AC
  Filled 2017-12-13: qty 3

## 2017-12-13 MED ORDER — PREDNISONE 20 MG PO TABS: 60.0000 mg | ORAL_TABLET | Freq: Once | ORAL | Status: DC

## 2017-12-13 MED ORDER — FAMOTIDINE 20 MG PO TABS: 20.0000 mg | ORAL_TABLET | Freq: Two times a day (BID) | ORAL | 0 refills | Status: DC

## 2017-12-13 MED ORDER — PREDNISONE 20 MG PO TABS: 40.0000 mg | ORAL_TABLET | Freq: Once | ORAL | Status: DC

## 2017-12-13 MED ORDER — FAMOTIDINE IN NACL 20-0.9 MG/50ML-% IV SOLN
20.0000 mg | Freq: Once | INTRAVENOUS | Status: AC
  Administered 2017-12-13: 20 mg via INTRAVENOUS
  Filled 2017-12-13: qty 50

## 2017-12-13 MED ORDER — EPINEPHRINE PF 1 MG/ML IJ SOLN
INTRAMUSCULAR | Status: AC
  Filled 2017-12-13: qty 1

## 2017-12-13 MED ORDER — SODIUM CHLORIDE 0.9 % IV BOLUS (SEPSIS)
2000.0000 mL | Freq: Once | INTRAVENOUS | Status: AC
  Administered 2017-12-13: 2000 mL via INTRAVENOUS

## 2017-12-13 MED ORDER — EPINEPHRINE 0.3 MG/0.3ML IJ SOAJ: 0.3000 mg | Freq: Once | INTRAMUSCULAR | 1 refills | Status: DC | PRN

## 2017-12-13 MED ORDER — PREDNISONE 20 MG PO TABS
60.0000 mg | ORAL_TABLET | Freq: Once | ORAL | Status: AC
  Administered 2017-12-13: 60 mg via ORAL

## 2017-12-13 MED ORDER — FAMOTIDINE 20 MG PO TABS
ORAL_TABLET | ORAL | Status: AC
  Filled 2017-12-13: qty 2

## 2017-12-13 MED ORDER — PREDNISONE 50 MG PO TABS: ORAL_TABLET | ORAL | 0 refills | Status: DC

## 2017-12-13 MED ORDER — FAMOTIDINE 20 MG PO TABS
40.0000 mg | ORAL_TABLET | Freq: Once | ORAL | Status: AC
  Administered 2017-12-13: 40 mg via ORAL

## 2017-12-13 MED ORDER — METHYLPREDNISOLONE SODIUM SUCC 125 MG IJ SOLR
125.0000 mg | Freq: Once | INTRAMUSCULAR | Status: AC
  Administered 2017-12-13: 125 mg via INTRAVENOUS
  Filled 2017-12-13: qty 2

## 2017-12-13 MED ORDER — EPINEPHRINE PF 1 MG/ML IJ SOLN
0.3000 mg | Freq: Once | INTRAMUSCULAR | Status: AC | PRN
  Administered 2017-12-13: 0.3 mg via INTRAMUSCULAR

## 2017-12-13 NOTE — Discharge Instructions (Signed)
Let them know immediately if your symptoms return or get worse.

## 2017-12-13 NOTE — ED Notes (Signed)
Pt stating she has new itching on her legs and upper abdominal pain. No new swelling noted

## 2017-12-13 NOTE — ED Provider Notes (Signed)
HPI  SUBJECTIVE:  Ashley Archer is a 47 y.o. female who presents with an allergic reaction.  Patient finished a course of Ceftdinir 2 days ago.  States that she had diffuse hives and her eyes swollen shut yesterday, was given a steroid shot at her PMDs office and started on prednisone 20 mg.  She states that her symptoms returned at 1030 today with right lip swelling, difficulty breathing, sensation of throat swelling shut, diarrhea, diffuse itching, nausea, urticaria.  She reports shortness of breath, sharp abdominal pain, and dizziness.  She took 50 mg of Benadryl and 20 mg of prednisone about 30 minutes prior to evaluation with mild improvement in the facial lip swelling, no further episodes of diarrhea.  She states symptoms are worse with standing up, warm temperatures.  She denies wheezing, syncope, voice changes.  No new lotions, soaps, detergents, foods.  She started vitamin E topically and a new glaucoma eyedrop 1 month ago has been tolerating this well.  No other medicines other than the recent cefdinir.  She has a past medical history of allergies, glaucoma.  No history of asthma, anaphylaxis.  LMP: 3/8.  Denies possibility being pregnant.  PMD: Dr. Virgina Jock.   Past Medical History:  Diagnosis Date  . CIN II (cervical intraepithelial neoplasia II)   . Fibroid   . GERD (gastroesophageal reflux disease)     Past Surgical History:  Procedure Laterality Date  . CERVICAL BIOPSY  W/ LOOP ELECTRODE EXCISION  2001   CIN ll  . DILATION AND CURETTAGE OF UTERUS    . Thumb Surg      Family History  Problem Relation Age of Onset  . Hypertension Mother   . Cancer Mother 103       cervical  . Pancreatic cancer Father   . Cancer Father 43       pancreatic  . Diabetes Maternal Aunt   . Diabetes Maternal Grandmother     Social History   Tobacco Use  . Smoking status: Never Smoker  . Smokeless tobacco: Never Used  Substance Use Topics  . Alcohol use: No  . Drug use: No    No current  facility-administered medications for this encounter.   Current Outpatient Medications:  .  loratadine (CLARITIN) 10 MG tablet, Take 10 mg by mouth daily as needed for allergies., Disp: , Rfl:  .  mometasone (NASONEX) 50 MCG/ACT nasal spray, Place 2 sprays into the nose daily as needed (allergies)., Disp: , Rfl:  .  predniSONE (DELTASONE) 20 MG tablet, Take 20 mg by mouth daily with breakfast., Disp: , Rfl:  .  Probiotic Product (ALIGN PO), Take 1 capsule by mouth daily., Disp: , Rfl:  .  ibuprofen (ADVIL,MOTRIN) 600 MG tablet, Take 1 tablet (600 mg total) by mouth every 6 (six) hours as needed., Disp: 30 tablet, Rfl: 0 .  methocarbamol (ROBAXIN) 500 MG tablet, Take 1 tablet (500 mg total) by mouth 2 (two) times daily., Disp: 20 tablet, Rfl: 0 .  traMADol (ULTRAM) 50 MG tablet, Take 1 tablet (50 mg total) by mouth every 6 (six) hours as needed., Disp: 6 tablet, Rfl: 0 .  valACYclovir (VALTREX) 500 MG tablet, Take 500 mg by mouth daily as needed for other. Flare ups, Disp: , Rfl:  .  VITAMIN E PO, Take 1 tablet by mouth daily., Disp: , Rfl:   Allergies  Allergen Reactions  . Cefdinir Shortness Of Breath  . Coconut Oil Swelling and Rash  . Hydrocodone Rash  ROS  As noted in HPI.   Physical Exam  BP (!) 149/94   Pulse 76   Temp 98.4 F (36.9 C) (Oral)   Resp 20   Wt 160 lb (72.6 kg)   LMP 12/07/2017   SpO2 100%   BMI 29.26 kg/m   Constitutional: Well developed, well nourished, no acute distress Eyes:  EOMI, conjunctiva normal bilaterally HENT: Normocephalic, atraumatic,mucus membranes moist airway widely patent.  Positive right upper lip swelling.  No tongue swelling see picture    Respiratory: Normal inspiratory effort, lungs clear bilaterally, air movement Cardiovascular: Normal rate GI: nondistended skin: Positive for urticaria of her bilateral arms, see picture    Musculoskeletal: no deformities Neurologic: Alert & oriented x 3, no focal neuro  deficits Psychiatric: Speech and behavior appropriate   ED Course   Medications  EPINEPHrine (ADRENALIN) 0.3 mg (0.3 mg Intramuscular Given 12/13/17 1246)  famotidine (PEPCID) tablet 40 mg (40 mg Oral Given 12/13/17 1246)  predniSONE (DELTASONE) tablet 60 mg (60 mg Oral Given 12/13/17 1245)    No orders of the defined types were placed in this encounter.   No results found for this or any previous visit (from the past 24 hour(s)). No results found.  ED Clinical Impression  Anaphylaxis, initial encounter   ED Assessment/Plan  Presentation consistent with anaphylaxis with mucosal involvement, diarrhea, urticaria.  Her vitals are acceptable, will give epinephrine 0.3 mg IM, Pepcid 40 mg p.o. and prednisone 60 mg p.o.  Will observe for 10-15 minutes and if stable, she may go by private vehicle. Transferring down to the ED for observation.  Reevaluation, patient states that she feels dizzy, states that she feels like her throat is swollen, however she has no voice changes. heart rate is regular, blood pressure is stable.  Her airway is still widely patent, feel that she needs observation and may need further doses of epinephrine and possible IV fluids.  Transferring down by wheelchair.  RN gave report.  Meds ordered this encounter  Medications  . DISCONTD: predniSONE (DELTASONE) tablet 60 mg  . EPINEPHrine (ADRENALIN) 0.3 mg  . famotidine (PEPCID) tablet 40 mg  . DISCONTD: predniSONE (DELTASONE) tablet 40 mg  . predniSONE (DELTASONE) tablet 60 mg    *This clinic note was created using [employer]. Therefore, there may be occasional mistakes despite careful proofreading.   ?   Melynda Ripple, MD 12/13/17 1310

## 2017-12-13 NOTE — ED Notes (Signed)
Pt reports abd pain that radiates into her chest states "it feels like I have indigestion and my feet are itching". No rash or irritation noted on feet or legs.

## 2017-12-13 NOTE — ED Provider Notes (Signed)
[place] EMERGENCY DEPARTMENT Provider Note   CSN: [id number] Arrival date & time: 12/13/17  1314     History   Chief Complaint Chief Complaint  Patient presents with  . Allergic Reaction    HPI   Blood pressure (!) 134/94, pulse 88, temperature 98.2 F (36.8 C), temperature source Oral, resp. rate 18, last menstrual period 12/07/2017, SpO2 100 %.  Ashley Archer is a 47 y.o. female with no significant past medical history sent by urgent care for anaphylactic reaction.  She was given EpiPen, Pepcid 40 mg p.o. and prednisone 60 mg p.o.  She reports feeling extremely dizzy and lightheaded with tight feeling in her throat.  Patient completed a 10-day course of Ceftin ear for sinusitis 2 days ago, she developed diffuse hives and was given steroid shot by PCP and prednisone p.o. by PCP, improved symptoms until this morning.  No prior history of anaphylactic reaction.  She denies any other new medications, she states that she has had new vitamin E states that she has been applying to a scar on her arm.   Past Medical History:  Diagnosis Date  . CIN II (cervical intraepithelial neoplasia II)   . Fibroid   . GERD (gastroesophageal reflux disease)     Patient Active Problem List   Diagnosis Date Noted  . CIN II (cervical intraepithelial neoplasia II)   . Fibroid     Past Surgical History:  Procedure Laterality Date  . CERVICAL BIOPSY  W/ LOOP ELECTRODE EXCISION  2001   CIN ll  . DILATION AND CURETTAGE OF UTERUS    . Thumb Surg      OB History    Gravida Para Term Preterm AB Living   3 2 2   1 2    SAB TAB Ectopic Multiple Live Births   1               Home Medications    Prior to Admission medications   Medication Sig Start Date End Date Taking? Authorizing Provider  Brimonidine Tartrate (LUMIFY) 0.025 % SOLN Place 1 drop into both eyes daily.   Yes [provider]  feeding supplement (BOOST HIGH PROTEIN) LIQD Take 1 Container by mouth  daily.   Yes [provider]  ibuprofen (ADVIL,MOTRIN) 600 MG tablet Take 1 tablet (600 mg total) by mouth every 6 (six) hours as needed. Patient taking differently: Take 200 mg by mouth every 6 (six) hours as needed.  07/25/16  Yes Nona Dell, PA-C  loratadine (CLARITIN) 10 MG tablet Take 10 mg by mouth daily as needed for allergies.   Yes [provider]  mometasone (NASONEX) 50 MCG/ACT nasal spray Place 2 sprays into the nose daily as needed (allergies).   Yes [provider]  predniSONE (DELTASONE) 20 MG tablet Take 20 mg by mouth daily with breakfast.   Yes [provider]  Probiotic Product (ALIGN PO) Take 1 capsule by mouth daily.   Yes [provider]  travoprost, benzalkonium, (TRAVATAN) 0.004 % ophthalmic solution Place 1 drop into both eyes at bedtime.   Yes [provider]  VITAMIN E PO Take 1 tablet by mouth daily.   Yes [provider]  EPINEPHrine (EPIPEN 2-PAK) 0.3 mg/0.3 mL IJ SOAJ injection Inject 0.3 mLs (0.3 mg total) into the muscle once as needed for up to 1 dose (for severe allergic reaction). CAll 911 immediately if you have to use this medicine 12/13/17   Julann Mcgilvray, Elmyra Ricks, PA-C  famotidine (  PEPCID) 20 MG tablet Take 1 tablet (20 mg total) by mouth 2 (two) times daily. 12/13/17   Ahlana Slaydon, Elmyra Ricks, PA-C  methocarbamol (ROBAXIN) 500 MG tablet Take 1 tablet (500 mg total) by mouth 2 (two) times daily. Patient not taking: Reported on 12/13/2017 07/25/16   Nona Dell, PA-C  predniSONE (DELTASONE) 50 MG tablet Take 1 tablet daily with breakfast 12/13/17   Camilo Mander, Elmyra Ricks, PA-C  traMADol (ULTRAM) 50 MG tablet Take 1 tablet (50 mg total) by mouth every 6 (six) hours as needed. Patient not taking: Reported on 12/13/2017 07/25/16   Nona Dell, PA-C    Family History Family History  Problem Relation Age of Onset  . Hypertension Mother   . Cancer Mother 72       cervical  .  Pancreatic cancer Father   . Cancer Father 51       pancreatic  . Diabetes Maternal Aunt   . Diabetes Maternal Grandmother     Social History Social History   Tobacco Use  . Smoking status: Never Smoker  . Smokeless tobacco: Never Used  Substance Use Topics  . Alcohol use: No  . Drug use: No     Allergies   Cefdinir; Coconut oil; and Hydrocodone   Review of Systems Review of Systems  A complete review of systems was obtained and all systems are negative except as noted in the HPI and PMH.   Physical Exam Updated Vital Signs BP 139/89 (BP Location: Left Arm)   Pulse 66   Temp 98.2 F (36.8 C) (Oral)   Resp 17   LMP 12/07/2017   SpO2 100%   Physical Exam  Constitutional: She is oriented to person, place, and time. She appears well-developed and well-nourished. No distress.  HENT:  Head: Normocephalic and atraumatic.  Mouth/Throat: Oropharynx is clear and moist.  Posterior pharynx edema, mild swelling to right upper lip  Eyes: Conjunctivae and EOM are normal. Pupils are equal, round, and reactive to light.  Neck: Normal range of motion.  Cardiovascular: Normal rate, regular rhythm and intact distal pulses.  Pulmonary/Chest: Effort normal and breath sounds normal.  Lung sounds clear, excellent air movement in all fields  Abdominal: Soft. There is no tenderness.  Musculoskeletal: Normal range of motion.  Neurological: She is alert and oriented to person, place, and time.  Skin: She is not diaphoretic.  Scattered mild hives  Psychiatric: She has a normal mood and affect.  Nursing note and vitals reviewed.    ED Treatments / Results  Labs (all labs ordered are listed, but only abnormal results are displayed) Labs Reviewed  I-STAT BETA HCG BLOOD, ED ([facility], [facility], [facility] ONLY)    EKG  EKG Interpretation None       Radiology No results found.  Procedures Procedures (including critical care time)  Medications Ordered in ED Medications  sodium chloride 0.9  % bolus 2,000 mL (2,000 mLs Intravenous New Bag/Given 12/13/17 1351)  methylPREDNISolone sodium succinate (SOLU-MEDROL) 125 mg/2 mL injection 125 mg (125 mg Intravenous Given 12/13/17 1404)  famotidine (PEPCID) IVPB 20 mg premix (0 mg Intravenous Stopped 12/13/17 1434)  diphenhydrAMINE (BENADRYL) injection 25 mg (25 mg Intravenous Given 12/13/17 1404)     Initial Impression / Assessment and Plan / ED Course  I have reviewed the triage vital signs and the nursing notes.  Pertinent labs & imaging results that were available during my care of the patient were reviewed by me and considered in my medical decision making (see chart for details).  Vitals:   12/13/17 1323 12/13/17 1416  BP: (!) 134/94 139/89  Pulse: 88 66  Resp: 18 17  Temp: 98.2 F (36.8 C)   TempSrc: Oral   SpO2: 100% 100%    Medications  sodium chloride 0.9 % bolus 2,000 mL (2,000 mLs Intravenous New Bag/Given 12/13/17 1351)  methylPREDNISolone sodium succinate (SOLU-MEDROL) 125 mg/2 mL injection 125 mg (125 mg Intravenous Given 12/13/17 1404)  famotidine (PEPCID) IVPB 20 mg premix (0 mg Intravenous Stopped 12/13/17 1434)  diphenhydrAMINE (BENADRYL) injection 25 mg (25 mg Intravenous Given 12/13/17 1404)    Ashley Archer is 47 y.o. female presenting with anaphylactic reaction, possibly to Ceftin ear.  She started having hives several days ago, she developed diarrhea, abdominal pain, hives, lightheadedness this morning.  Sent from urgent care, patient was given EpiPen with some improvement in her symptoms, vital signs are reassuring with no hypotension, she is reporting severe lightheadedness and sensation of throat closing.  Airway widely patent.  Lung sounds clear, no posterior pharynx edema.  Patient will be given hydration, Solu-Medrol, Pepcid and Benadryl, will need observation until 5 PM.  She is reporting increased itching with some epigastric discomfort but no nausea, vomiting or diarrhea.  Vital signs remained  stable, no indication to re-dose epinephrine at this time.  Case signed out to Dr. Leonette Monarch at shift change.    Final Clinical Impressions(s) / ED Diagnoses   Final diagnoses:  Anaphylaxis, initial encounter    ED Discharge Orders        Ordered    EPINEPHrine (EPIPEN 2-PAK) 0.3 mg/0.3 mL IJ SOAJ injection  Once PRN     12/13/17 1538    famotidine (PEPCID) 20 MG tablet  2 times daily     12/13/17 1538    predniSONE (DELTASONE) 50 MG tablet     12/13/17 1538       Garielle Mroz, Charna Elizabeth 12/13/17 1611    Fatima Blank, MD 12/13/17 1815

## 2017-12-13 NOTE — Discharge Instructions (Signed)
If you develop any wheezing or shortness of breath administered her EpiPen immediately as instructed. Call 911 immediately he will need to present for evaluation at any time after you give yourself your EpiPen.  Please follow with your primary care doctor in the next 2 days for a check-up. They must obtain records for further management.   Do not hesitate to return to the Emergency Department for any new, worsening or concerning symptoms.

## 2017-12-13 NOTE — ED Notes (Signed)
ED Provider at bedside. 

## 2017-12-13 NOTE — ED Triage Notes (Signed)
PT finished cefdinir Tuesday, broke out in rash weds, given a steroid shot this AM, PO prednisone. After epi developed nausea, dizziness, lightheadedness. HR 70s. Took benadryl around 1140. Pt remains lightheaded. Brought by urgent care RN.

## 2017-12-13 NOTE — ED Triage Notes (Addendum)
PT finished an cefdinirTuesday night. Broke out into a rash Wednesday. Pt saw her PCP yesterday and got a steroid shot and started PO prednisone this AM  PT reports 30-45 minutes ago she developed itchy throat, upper lip swelling.   PT took prednisone this AM and benadryl 30 minutes ago.   PT speaking in full sentences in triage, no signs of distress.

## 2017-12-13 NOTE — ED Notes (Signed)
Patient ambulatory to bathroom with steady gait at this time 

## 2017-12-13 NOTE — ED Notes (Signed)
Pt attempted to stand to go to RR stated she felt lightheaded.

## 2017-12-15 ENCOUNTER — Emergency Department (HOSPITAL_COMMUNITY): Payer: 59

## 2017-12-15 ENCOUNTER — Encounter (HOSPITAL_COMMUNITY): Payer: Self-pay | Admitting: Emergency Medicine

## 2017-12-15 ENCOUNTER — Emergency Department (HOSPITAL_COMMUNITY)
Admission: EM | Admit: 2017-12-15 | Discharge: 2017-12-15 | Disposition: A | Discharge status: Home / Self Care | Payer: 59 | Attending: Emergency Medicine | Admitting: Emergency Medicine

## 2017-12-15 ENCOUNTER — Other Ambulatory Visit: Payer: Self-pay

## 2017-12-15 DIAGNOSIS — R519 Headache, unspecified: Secondary | ICD-10-CM

## 2017-12-15 DIAGNOSIS — R0789 Other chest pain: Secondary | ICD-10-CM

## 2017-12-15 DIAGNOSIS — R102 Pelvic and perineal pain: Secondary | ICD-10-CM | POA: Insufficient documentation

## 2017-12-15 DIAGNOSIS — J029 Acute pharyngitis, unspecified: Secondary | ICD-10-CM | POA: Diagnosis not present

## 2017-12-15 DIAGNOSIS — R51 Headache: Secondary | ICD-10-CM | POA: Insufficient documentation

## 2017-12-15 DIAGNOSIS — Z79899 Other long term (current) drug therapy: Secondary | ICD-10-CM | POA: Diagnosis not present

## 2017-12-15 DIAGNOSIS — R079 Chest pain, unspecified: Secondary | ICD-10-CM | POA: Diagnosis not present

## 2017-12-15 DIAGNOSIS — R42 Dizziness and giddiness: Secondary | ICD-10-CM | POA: Diagnosis present

## 2017-12-15 LAB — BASIC METABOLIC PANEL
ANION GAP: 11 (ref 5–15)
BUN: 14 mg/dL (ref 6–20)
CALCIUM: 9.2 mg/dL (ref 8.9–10.3)
CO2: 23 mmol/L (ref 22–32)
Chloride: 105 mmol/L (ref 101–111)
Creatinine, Ser: 0.82 mg/dL (ref 0.44–1.00)
GLUCOSE: 87 mg/dL (ref 65–99)
POTASSIUM: 3.4 mmol/L — AB (ref 3.5–5.1)
SODIUM: 139 mmol/L (ref 135–145)

## 2017-12-15 LAB — CBC
HEMATOCRIT: 40.6 % (ref 36.0–46.0)
HEMOGLOBIN: 13.5 g/dL (ref 12.0–15.0)
MCH: 30.9 pg (ref 26.0–34.0)
MCHC: 33.3 g/dL (ref 30.0–36.0)
MCV: 92.9 fL (ref 78.0–100.0)
Platelets: 188 10*3/uL (ref 150–400)
RBC: 4.37 MIL/uL (ref 3.87–5.11)
RDW: 12.4 % (ref 11.5–15.5)
WBC: 13.3 10*3/uL — AB (ref 4.0–10.5)

## 2017-12-15 LAB — I-STAT TROPONIN, ED: TROPONIN I, POC: 0.01 ng/mL (ref 0.00–0.08)

## 2017-12-15 LAB — URINALYSIS, ROUTINE W REFLEX MICROSCOPIC
BACTERIA UA: NONE SEEN
BILIRUBIN URINE: NEGATIVE
GLUCOSE, UA: NEGATIVE mg/dL
KETONES UR: NEGATIVE mg/dL
Leukocytes, UA: NEGATIVE
NITRITE: NEGATIVE
Protein, ur: NEGATIVE mg/dL
RBC / HPF: NONE SEEN RBC/hpf (ref 0–5)
SPECIFIC GRAVITY, URINE: 1.018 (ref 1.005–1.030)
pH: 6 (ref 5.0–8.0)

## 2017-12-15 LAB — I-STAT BETA HCG BLOOD, ED (MC, WL, AP ONLY)

## 2017-12-15 LAB — RAPID STREP SCREEN (MED CTR MEBANE ONLY): Streptococcus, Group A Screen (Direct): NEGATIVE

## 2017-12-15 LAB — WET PREP, GENITAL
Clue Cells Wet Prep HPF POC: NONE SEEN
Sperm: NONE SEEN
Trich, Wet Prep: NONE SEEN
Yeast Wet Prep HPF POC: NONE SEEN

## 2017-12-15 MED ORDER — SODIUM CHLORIDE 0.9 % IV BOLUS (SEPSIS)
1000.0000 mL | Freq: Once | INTRAVENOUS | Status: AC
  Administered 2017-12-15: 1000 mL via INTRAVENOUS

## 2017-12-15 MED ORDER — FAMOTIDINE IN NACL 20-0.9 MG/50ML-% IV SOLN
20.0000 mg | Freq: Once | INTRAVENOUS | Status: AC
  Administered 2017-12-15: 20 mg via INTRAVENOUS
  Filled 2017-12-15: qty 50

## 2017-12-15 MED ORDER — POTASSIUM CHLORIDE CRYS ER 20 MEQ PO TBCR
40.0000 meq | EXTENDED_RELEASE_TABLET | Freq: Once | ORAL | Status: AC
  Administered 2017-12-15: 40 meq via ORAL
  Filled 2017-12-15: qty 2

## 2017-12-15 MED ORDER — TRAMADOL HCL 50 MG PO TABS: 50.0000 mg | ORAL_TABLET | Freq: Four times a day (QID) | ORAL | 0 refills | Status: DC | PRN

## 2017-12-15 MED ORDER — METHYLPREDNISOLONE SODIUM SUCC 125 MG IJ SOLR
125.0000 mg | Freq: Once | INTRAMUSCULAR | Status: AC
  Administered 2017-12-15: 125 mg via INTRAVENOUS
  Filled 2017-12-15: qty 2

## 2017-12-15 MED ORDER — DIPHENHYDRAMINE HCL 50 MG/ML IJ SOLN
25.0000 mg | Freq: Once | INTRAMUSCULAR | Status: AC
  Administered 2017-12-15: 25 mg via INTRAVENOUS
  Filled 2017-12-15: qty 1

## 2017-12-15 NOTE — ED Triage Notes (Signed)
Reports having an allergic reaction on Wednesday.  Seen at family doctor on Thursday.  Continued to get worse.  Came here on Friday.  Continues to have swollen lips.  Reports chest pain, and pain in neck and throat.   Airway patent.

## 2017-12-15 NOTE — Discharge Instructions (Signed)
Please continue Benadryl, steroids, and pepcid Take Tramadol for severe pain. Otherwise take Tylenol or Ibuprofen Follow up with OBGYN or surgery Return if worsening

## 2017-12-15 NOTE — ED Notes (Signed)
Pt pulled into a room temporarily, changing into gown and pelvic cart set up.

## 2017-12-15 NOTE — ED Provider Notes (Signed)
[facility] EMERGENCY DEPARTMENT Provider Note   CSN: [id number] Arrival date & time: 12/15/17  9233   History   Chief Complaint Chief Complaint  Patient presents with  . Allergic Reaction    HPI Ashley Archer is a 46 y.o. female who presents with multiple complaints. PMH significant for fibroids, CIN II, GERD. After she was discharged two days ago she overall felt better. She states that last night she started to feel very lightheaded again. She went to lie down. Then she developed sharp, cramping, left sided chest pain at about 4AM. This is constant and non-radiating. She also reports pain in her lower abdomen. This was present 2 days ago as well but not as severe she states. It is sharp and cramping. She's been having discharge which was attributed to a yeast infection from antibiotics. She also has a sore throat. She states this is different in that last time it felt like her throat was closing and this time it's just painful. LMP was 3/9. She denies fever, chills, SOB, cough, wheezing, N/V/D, urinary symptoms. She's been taking Benadryl, Pepcid, and steroids for allergic reaction 2 days ago.   HPI  Past Medical History:  Diagnosis Date  . CIN II (cervical intraepithelial neoplasia II)   . Fibroid   . GERD (gastroesophageal reflux disease)     Patient Active Problem List   Diagnosis Date Noted  . CIN II (cervical intraepithelial neoplasia II)   . Fibroid     Past Surgical History:  Procedure Laterality Date  . CERVICAL BIOPSY  W/ LOOP ELECTRODE EXCISION  2001   CIN ll  . DILATION AND CURETTAGE OF UTERUS    . Thumb Surg      OB History    Gravida Para Term Preterm AB Living   3 2 2   1 2    SAB TAB Ectopic Multiple Live Births   1               Home Medications    Prior to Admission medications   Medication Sig Start Date End Date Taking? Authorizing Provider  Brimonidine Tartrate (LUMIFY) 0.025 % SOLN Place 1 drop into both eyes daily.     [provider]  EPINEPHrine (EPIPEN 2-PAK) 0.3 mg/0.3 mL IJ SOAJ injection Inject 0.3 mLs (0.3 mg total) into the muscle once as needed for up to 1 dose (for severe allergic reaction). CAll 911 immediately if you have to use this medicine 12/13/17   Pisciotta, Elmyra Ricks, PA-C  famotidine (PEPCID) 20 MG tablet Take 1 tablet (20 mg total) by mouth 2 (two) times daily. 12/13/17   Pisciotta, Elmyra Ricks, PA-C  feeding supplement (BOOST HIGH PROTEIN) LIQD Take 1 Container by mouth daily.    [provider]  ibuprofen (ADVIL,MOTRIN) 600 MG tablet Take 1 tablet (600 mg total) by mouth every 6 (six) hours as needed. Patient taking differently: Take 200 mg by mouth every 6 (six) hours as needed.  07/25/16   Nona Dell, PA-C  loratadine (CLARITIN) 10 MG tablet Take 10 mg by mouth daily as needed for allergies.    [provider]  methocarbamol (ROBAXIN) 500 MG tablet Take 1 tablet (500 mg total) by mouth 2 (two) times daily. Patient not taking: Reported on 12/13/2017 07/25/16   Nona Dell, PA-C  mometasone (NASONEX) 50 MCG/ACT nasal spray Place 2 sprays into the nose daily as needed (allergies).    [provider]  predniSONE (DELTASONE) 20 MG tablet Take 20 mg  by mouth daily with breakfast.    [provider]  predniSONE (DELTASONE) 50 MG tablet Take 1 tablet daily with breakfast 12/13/17   Pisciotta, Elmyra Ricks, PA-C  Probiotic Product (ALIGN PO) Take 1 capsule by mouth daily.    [provider]  traMADol (ULTRAM) 50 MG tablet Take 1 tablet (50 mg total) by mouth every 6 (six) hours as needed. Patient not taking: Reported on 12/13/2017 07/25/16   Nona Dell, PA-C  travoprost, benzalkonium, (TRAVATAN) 0.004 % ophthalmic solution Place 1 drop into both eyes at bedtime.    [provider]  VITAMIN E PO Take 1 tablet by mouth daily.    [provider]    Family History Family History  Problem Relation Age of  Onset  . Hypertension Mother   . Cancer Mother 48       cervical  . Pancreatic cancer Father   . Cancer Father 69       pancreatic  . Diabetes Maternal Aunt   . Diabetes Maternal Grandmother     Social History Social History   Tobacco Use  . Smoking status: Never Smoker  . Smokeless tobacco: Never Used  Substance Use Topics  . Alcohol use: No  . Drug use: No     Allergies   Cefdinir; Coconut oil; and Hydrocodone   Review of Systems Review of Systems  Constitutional: Negative for chills and fever.  HENT: Positive for sore throat.   Respiratory: Negative for cough, shortness of breath and wheezing.   Cardiovascular: Positive for chest pain.  Gastrointestinal: Positive for abdominal pain. Negative for diarrhea, nausea and vomiting.  Genitourinary: Positive for pelvic pain and vaginal discharge. Negative for dysuria, flank pain, frequency and vaginal bleeding.  Neurological: Positive for light-headedness and headaches. Negative for syncope.  All other systems reviewed and are negative.    Physical Exam Updated Vital Signs BP (!) 134/92 (BP Location: Left Arm)   Pulse (!) 57   Temp 98.1 F (36.7 C) (Oral)   Resp 18   Ht 5\' 2"  (1.575 m)   Wt 72.6 kg (160 lb)   LMP 12/07/2017   SpO2 100%   BMI 29.26 kg/m   Physical Exam  Constitutional: She is oriented to person, place, and time. She appears well-developed and well-nourished. No distress.  Calm, comfortable appearing  HENT:  Head: Normocephalic and atraumatic.  Mouth/Throat: Uvula is midline, oropharynx is clear and moist and mucous membranes are normal.  Eyes: Conjunctivae are normal. Pupils are equal, round, and reactive to light. Right eye exhibits no discharge. Left eye exhibits no discharge. No scleral icterus.  Neck: Normal range of motion.  Cardiovascular: Regular rhythm. Bradycardia present.  Pulmonary/Chest: Effort normal and breath sounds normal. No respiratory distress.  Abdominal: Soft. Bowel  sounds are normal. She exhibits no distension. There is tenderness (lower abdominal tenderness).  Genitourinary:  Genitourinary Comments: Pelvic: No inguinal lymphadenopathy or inguinal hernia noted. Normal external genitalia. No pain with speculum insertion. Closed cervical os with normal appearance - no rash or lesions. No significant discharge or bleeding noted from cervix or in vaginal vault. On bimanual examination no adnexal tenderness or cervical motion tenderness. Chaperone Santiago Glad, RN) present during exam.    Neurological: She is alert and oriented to person, place, and time.  Skin: Skin is warm and dry.  Psychiatric: She has a normal mood and affect. Her behavior is normal.  Nursing note and vitals reviewed.   ED Treatments / Results  Labs (all labs ordered are listed,  but only abnormal results are displayed) Labs Reviewed  WET PREP, GENITAL - Abnormal; Notable for the following components:      Result Value   WBC, Wet Prep HPF POC MODERATE (*)    All other components within normal limits  BASIC METABOLIC PANEL - Abnormal; Notable for the following components:   Potassium 3.4 (*)    All other components within normal limits  CBC - Abnormal; Notable for the following components:   WBC 13.3 (*)    All other components within normal limits  URINALYSIS, ROUTINE W REFLEX MICROSCOPIC - Abnormal; Notable for the following components:   Hgb urine dipstick SMALL (*)    Squamous Epithelial / LPF 0-5 (*)    All other components within normal limits  RAPID STREP SCREEN (NOT AT [facility])  CULTURE, GROUP A STREP ([place])  I-STAT TROPONIN, ED  I-STAT BETA HCG BLOOD, ED ([facility], [facility], [facility] ONLY)  GC/CHLAMYDIA PROBE AMP ([place]) NOT AT [facility]    EKG  EKG Interpretation  Date/Time:  Sunday December 15 2017 05:31:11 EDT Ventricular Rate:  56 PR Interval:  192 QRS Duration: 82 QT Interval:  386 QTC Calculation: 372 R Axis:   84 Text Interpretation:  Sinus bradycardia Otherwise normal ECG No  significant change was found Confirmed by Ezequiel Essex [phone]) on 12/15/2017 6:37:04 AM       Radiology Dg Chest 2 View  Result Date: 12/15/2017 CLINICAL DATA:  Chest pain. EXAM: CHEST - 2 VIEW COMPARISON:  Radiographs 07/25/2016 FINDINGS: The cardiomediastinal contours are normal. The lungs are clear. Pulmonary vasculature is normal. No consolidation, pleural effusion, or pneumothorax. No acute osseous abnormalities are seen. IMPRESSION: No acute pulmonary process. Electronically Signed   By: Jeb Levering M.D.   On: 12/15/2017 06:07    Procedures Procedures (including critical care time)  Medications Ordered in ED Medications  methylPREDNISolone sodium succinate (SOLU-MEDROL) 125 mg/2 mL injection 125 mg (125 mg Intravenous Given 12/15/17 0650)  diphenhydrAMINE (BENADRYL) injection 25 mg (25 mg Intravenous Given 12/15/17 0650)  famotidine (PEPCID) IVPB 20 mg premix (0 mg Intravenous Stopped 12/15/17 0720)  sodium chloride 0.9 % bolus 1,000 mL (0 mLs Intravenous Stopped 12/15/17 0750)  potassium chloride SA (K-DUR,KLOR-CON) CR tablet 40 mEq (40 mEq Oral Given 12/15/17 1006)     Initial Impression / Assessment and Plan / ED Course  I have reviewed the triage vital signs and the nursing notes.  Pertinent labs & imaging results that were available during my care of the patient were reviewed by me and considered in my medical decision making (see chart for details).  47 year old female presents with multiple complaints. Her vitals are normal. Her exam is not consistent with an allergic reaction. Her main complaints are pain - headache, sore throat, chest pain, pelvic pain. Her throat looks unremarkable. She is able to speak clearly and is not in any distress. Her lungs are CTA. Chest pain is atypical in nature. She is tender in the pelvic area however pelvic exam is unremarkable. Reviewed old imaging which commented on large fibroids and a nonspecific small 1.7 cm soft tissue mass in the  region of the left inguinal canal. She has follow up for this. Her EKG is sinus brady. Her CXR is clear. Her labs are remarkable for very mild hypokalemia (3.4). Her UA is clean. Wet prep has moderate WBC. Strep is negative. She was given fluids, pepcid, potassium, benadryl, and steroids with some improvement in her symptoms. Advised continue meds prescribed to her and  will give small amt of Tramadol. Advised follow up with her doctor. Return precautions given.  Final Clinical Impressions(s) / ED Diagnoses   Final diagnoses:  Nonintractable headache, unspecified chronicity pattern, unspecified headache type  Atypical chest pain  Pelvic pain  Sore throat    ED Discharge Orders    None       Recardo Evangelist, PA-C 12/15/17 1220    Ezequiel Essex, MD 12/16/17 [phone]

## 2017-12-16 LAB — GC/CHLAMYDIA PROBE AMP (~~LOC~~) NOT AT ARMC
CHLAMYDIA, DNA PROBE: NEGATIVE
NEISSERIA GONORRHEA: NEGATIVE

## 2017-12-17 ENCOUNTER — Emergency Department (HOSPITAL_COMMUNITY)
Admission: EM | Admit: 2017-12-17 | Discharge: 2017-12-17 | Disposition: A | Discharge status: Home / Self Care | Payer: 59 | Attending: Emergency Medicine | Admitting: Emergency Medicine

## 2017-12-17 ENCOUNTER — Emergency Department (HOSPITAL_COMMUNITY): Payer: 59

## 2017-12-17 ENCOUNTER — Encounter (HOSPITAL_COMMUNITY): Payer: Self-pay

## 2017-12-17 DIAGNOSIS — R0789 Other chest pain: Secondary | ICD-10-CM

## 2017-12-17 DIAGNOSIS — L299 Pruritus, unspecified: Secondary | ICD-10-CM | POA: Insufficient documentation

## 2017-12-17 DIAGNOSIS — T782XXA Anaphylactic shock, unspecified, initial encounter: Secondary | ICD-10-CM | POA: Diagnosis not present

## 2017-12-17 DIAGNOSIS — R0602 Shortness of breath: Secondary | ICD-10-CM | POA: Insufficient documentation

## 2017-12-17 DIAGNOSIS — R600 Localized edema: Secondary | ICD-10-CM | POA: Diagnosis present

## 2017-12-17 DIAGNOSIS — R1032 Left lower quadrant pain: Secondary | ICD-10-CM | POA: Diagnosis not present

## 2017-12-17 DIAGNOSIS — R1030 Lower abdominal pain, unspecified: Secondary | ICD-10-CM | POA: Insufficient documentation

## 2017-12-17 DIAGNOSIS — R062 Wheezing: Secondary | ICD-10-CM | POA: Diagnosis not present

## 2017-12-17 DIAGNOSIS — R109 Unspecified abdominal pain: Secondary | ICD-10-CM

## 2017-12-17 LAB — COMPREHENSIVE METABOLIC PANEL
ALBUMIN: 4 g/dL (ref 3.5–5.0)
ALK PHOS: 44 U/L (ref 38–126)
ALT: 20 U/L (ref 14–54)
AST: 23 U/L (ref 15–41)
Anion gap: 12 (ref 5–15)
BUN: 13 mg/dL (ref 6–20)
CO2: 22 mmol/L (ref 22–32)
CREATININE: 0.79 mg/dL (ref 0.44–1.00)
Calcium: 9.3 mg/dL (ref 8.9–10.3)
Chloride: 105 mmol/L (ref 101–111)
GFR calc Af Amer: >60 mL/min (ref >60)
GLUCOSE: 150 mg/dL — AB (ref 65–99)
Potassium: 3.8 mmol/L (ref 3.5–5.1)
Sodium: 139 mmol/L (ref 135–145)
Total Bilirubin: 0.5 mg/dL (ref 0.3–1.2)
Total Protein: 7.4 g/dL (ref 6.5–8.1)

## 2017-12-17 LAB — CBC WITH DIFFERENTIAL/PLATELET
BASOS ABS: 0 10*3/uL (ref 0.0–0.1)
BASOS PCT: 0 %
Eosinophils Absolute: 0 10*3/uL (ref 0.0–0.7)
Eosinophils Relative: 0 %
HEMATOCRIT: 42.8 % (ref 36.0–46.0)
HEMOGLOBIN: 14.4 g/dL (ref 12.0–15.0)
LYMPHS PCT: 21 %
Lymphs Abs: 1.6 10*3/uL (ref 0.7–4.0)
MCH: 31 pg (ref 26.0–34.0)
MCHC: 33.6 g/dL (ref 30.0–36.0)
MCV: 92.2 fL (ref 78.0–100.0)
MONO ABS: 0.2 10*3/uL (ref 0.1–1.0)
Monocytes Relative: 2 %
NEUTROS ABS: 5.9 10*3/uL (ref 1.7–7.7)
NEUTROS PCT: 77 %
Platelets: 215 10*3/uL (ref 150–400)
RBC: 4.64 MIL/uL (ref 3.87–5.11)
RDW: 12.3 % (ref 11.5–15.5)
WBC: 7.7 10*3/uL (ref 4.0–10.5)

## 2017-12-17 LAB — I-STAT BETA HCG BLOOD, ED (MC, WL, AP ONLY): I-stat hCG, quantitative: <5 m[IU]/mL (ref <5)

## 2017-12-17 LAB — CULTURE, GROUP A STREP (THRC)

## 2017-12-17 LAB — LIPASE, BLOOD: Lipase: 22 U/L (ref 11–51)

## 2017-12-17 LAB — TROPONIN I

## 2017-12-17 MED ORDER — IOPAMIDOL (ISOVUE-300) INJECTION 61%
INTRAVENOUS | Status: AC
  Administered 2017-12-17: 30 mL via ORAL
  Filled 2017-12-17: qty 30

## 2017-12-17 MED ORDER — IOPAMIDOL (ISOVUE-300) INJECTION 61%
INTRAVENOUS | Status: AC
  Administered 2017-12-17: 100 mL
  Filled 2017-12-17: qty 100

## 2017-12-17 MED ORDER — LORAZEPAM 2 MG/ML IJ SOLN
1.0000 mg | Freq: Once | INTRAMUSCULAR | Status: AC
  Administered 2017-12-17: 1 mg via INTRAVENOUS
  Filled 2017-12-17: qty 1

## 2017-12-17 NOTE — ED Notes (Signed)
CT will get patient around 1915.

## 2017-12-17 NOTE — ED Triage Notes (Signed)
Patient arrived via [facility] from home. Patient had an allergic reaction to unknown source. Patient c/o of rash and hives. When ems got their they saw no symptoms, no rash, no hives. Patient gave herself a epi pen around 2:25 pm. Patients EKG was NSR. HR-60. 100% on room air. bp-150/60 per ems. Patients lungs are clear bilateral per ems.

## 2017-12-17 NOTE — ED Provider Notes (Signed)
[facility] DEPT Provider Note   CSN: [id number] Arrival date & time: 12/17/17  1515     History   Chief Complaint Chief Complaint  Patient presents with  . Allergic Reaction    HPI   Blood pressure 127/72, pulse 64, temperature 98.1 F (36.7 C), temperature source Oral, resp. rate 19, last menstrual period 12/07/2017, SpO2 100 %.  Ashley Archer is a 47 y.o. female past medical history significant for fibroids, GERD, brought in by EMS for possible allergic reaction.  Patient states she was feeling well this morning, she felt that her lip was swelling.  She heard herself wheezing and became very itchy, she self administered an EpiPen per the instructions of 911 operator.  Patient is reporting chest pain (left-sided, nonradiating, nonpleuritic), abdominal pain (lower quadrant, no associated emesis, diarrhea, abnormal vaginal discharge, change in urination), headache, feeling agitated, itching but she has no hives, she feels that the EpiPen helped the shortness of breath and wheezing.  She supplements her the EpiPen at 14:25.  She has not been able to see her primary care doctor but has an appointment with an allergist next week.  Patient notes that she had an abnormal CT of her abdomen pelvis 3 months ago which showed a left-sided pelvic enlarged lymph node.  Past Medical History:  Diagnosis Date  . CIN II (cervical intraepithelial neoplasia II)   . Fibroid   . GERD (gastroesophageal reflux disease)     Patient Active Problem List   Diagnosis Date Noted  . CIN II (cervical intraepithelial neoplasia II)   . Fibroid     Past Surgical History:  Procedure Laterality Date  . CERVICAL BIOPSY  W/ LOOP ELECTRODE EXCISION  2001   CIN ll  . DILATION AND CURETTAGE OF UTERUS    . Thumb Surg      OB History    Gravida Para Term Preterm AB Living   3 2 2   1 2    SAB TAB Ectopic Multiple Live Births   1               Home Medications    Prior to  Admission medications   Medication Sig Start Date End Date Taking? Authorizing Provider  Brimonidine Tartrate (LUMIFY) 0.025 % SOLN Place 1 drop into both eyes daily.   Yes [provider]  diphenhydrAMINE (BENADRYL) 25 MG tablet Take 50 mg by mouth daily as needed for itching (HIVES).   Yes [provider]  EPINEPHrine (EPIPEN 2-PAK) 0.3 mg/0.3 mL IJ SOAJ injection Inject 0.3 mLs (0.3 mg total) into the muscle once as needed for up to 1 dose (for severe allergic reaction). CAll 911 immediately if you have to use this medicine 12/13/17  Yes Merlene Dante, Elmyra Ricks, PA-C  famotidine (PEPCID) 20 MG tablet Take 1 tablet (20 mg total) by mouth 2 (two) times daily. 12/13/17  Yes Lenyx Boody, Elmyra Ricks, PA-C  feeding supplement (BOOST HIGH PROTEIN) LIQD Take 1 Container by mouth daily.   Yes [provider]  HYDROCORTISONE PO Apply 1 application topically 3 (three) times daily as needed (ITCHING).   Yes [provider]  ibuprofen (ADVIL,MOTRIN) 600 MG tablet Take 1 tablet (600 mg total) by mouth every 6 (six) hours as needed. Patient taking differently: Take 200 mg by mouth every 6 (six) hours as needed.  07/25/16  Yes Nona Dell, PA-C  loratadine (CLARITIN) 10 MG tablet Take 10 mg by mouth daily as needed for allergies.   Yes [provider]  mometasone (NASONEX) 50 MCG/ACT nasal spray Place 2 sprays into the nose daily as needed (allergies).   Yes [provider]  predniSONE (DELTASONE) 50 MG tablet Take 1 tablet daily with breakfast 12/13/17  Yes Markea Ruzich, Elmyra Ricks, PA-C  Probiotic Product (ALIGN PO) Take 1 capsule by mouth daily.   Yes [provider]  travoprost, benzalkonium, (TRAVATAN) 0.004 % ophthalmic solution Place 1 drop into both eyes at bedtime.   Yes [provider]  VITAMIN E PO Take 1 tablet by mouth daily.   Yes [provider]  traMADol (ULTRAM) 50 MG tablet Take 1 tablet (50 mg total) by mouth every 6 (six)  hours as needed. Patient not taking: Reported on 12/17/2017 12/15/17   Recardo Evangelist, PA-C    Family History Family History  Problem Relation Age of Onset  . Hypertension Mother   . Cancer Mother 94       cervical  . Pancreatic cancer Father   . Cancer Father 96       pancreatic  . Diabetes Maternal Aunt   . Diabetes Maternal Grandmother     Social History Social History   Tobacco Use  . Smoking status: Never Smoker  . Smokeless tobacco: Never Used  Substance Use Topics  . Alcohol use: No  . Drug use: No     Allergies   Cefdinir; Coconut oil; and Hydrocodone   Review of Systems Review of Systems  A complete review of systems was obtained and all systems are negative except as noted in the HPI and PMH.   Physical Exam Updated Vital Signs BP 126/79   Pulse 60   Temp 98.1 F (36.7 C) (Oral)   Resp 19   LMP 12/07/2017   SpO2 97%   Physical Exam  Constitutional: She is oriented to person, place, and time. She appears well-developed and well-nourished. No distress.  HENT:  Head: Normocephalic and atraumatic.  Mouth/Throat: Oropharynx is clear and moist.  Eyes: Conjunctivae and EOM are normal. Pupils are equal, round, and reactive to light.  Neck: Normal range of motion. No JVD present. No tracheal deviation present.  Cardiovascular: Normal rate, regular rhythm and intact distal pulses.  Radial pulse equal bilaterally  Pulmonary/Chest: Effort normal and breath sounds normal. No stridor. No respiratory distress. She has no wheezes. She has no rales. She exhibits no tenderness.  No stridor or drooling. No posterior pharynx edema, lip or tongue swelling. Pt reclining comfortably, speaking in complete sentences.   No Wheezing, excellent air movement in all fields.    Abdominal: Soft. She exhibits no distension and no mass. There is tenderness. There is no rebound and no guarding. No hernia.  Mild tenderness in the left lower quadrant with no guarding or rebound    Musculoskeletal: Normal range of motion. She exhibits no edema or tenderness.  No calf asymmetry, superficial collaterals, palpable cords, edema, Homans sign negative bilaterally.    Neurological: She is alert and oriented to person, place, and time.  Skin: Skin is warm. She is not diaphoretic.  Psychiatric:  Appears mildly agitated  Nursing note and vitals reviewed.    ED Treatments / Results  Labs (all labs ordered are listed, but only abnormal results are displayed) Labs Reviewed  COMPREHENSIVE METABOLIC PANEL - Abnormal; Notable for the following components:      Result Value   Glucose, Bld 150 (*)    All other components within normal limits  CBC WITH DIFFERENTIAL/PLATELET  LIPASE, BLOOD  TROPONIN I  I-STAT BETA HCG BLOOD, ED ([facility], [facility], [facility] ONLY)    EKG  EKG Interpretation  Date/Time:  Tuesday December 17 2017 15:34:55 EDT Ventricular Rate:  64 PR Interval:    QRS Duration: 89 QT Interval:  408 QTC Calculation: 421 R Axis:   69 Text Interpretation:  Sinus rhythm No significant change since last tracing Confirmed by Orlie Dakin [phone]) on 12/17/2017 5:46:13 PM       Radiology Ct Chest W Contrast  Result Date: 12/17/2017 CLINICAL DATA:  Chest pain and left lower quadrant abdominal pain. Wheezing. EXAM: CT CHEST, ABDOMEN, AND PELVIS WITH CONTRAST TECHNIQUE: Multidetector CT imaging of the chest, abdomen and pelvis was performed following the standard protocol during bolus administration of intravenous contrast. CONTRAST:  190mL ISOVUE-300 IOPAMIDOL (ISOVUE-300) INJECTION 61% COMPARISON:  Chest radiograph 12/15/2017 FINDINGS: CT CHEST FINDINGS Cardiovascular: No significant vascular findings. Normal heart size. No pericardial effusion. Mediastinum/Nodes: No enlarged mediastinal, hilar, or axillary lymph nodes. Thyroid gland, trachea, and esophagus demonstrate no significant findings. Lungs/Pleura: Lungs are clear. No pleural effusion or pneumothorax. Musculoskeletal: No  chest wall mass or suspicious bone lesions identified. CT ABDOMEN PELVIS FINDINGS Hepatobiliary: Normal hepatic contours and density. No visible biliary dilatation. Normal gallbladder. Pancreas: Normal parenchymal contours without ductal dilatation. No peripancreatic fluid collection. Spleen: Normal. Adrenals/Urinary Tract: --Adrenal glands: Normal. --Right kidney/ureter: A subcentimeter hypodensity in the right kidney is too small characterize accurately. No hydronephrosis or nephrolithiasis. --Left kidney/ureter: 2 mm lower pole nonobstructing calculus. No hydronephrosis. --Urinary bladder: Normal appearance for the degree of distention. Stomach/Bowel: --Stomach/Duodenum: No hiatal hernia or other gastric abnormality. Normal duodenal course. --Small bowel: No dilatation or inflammation. --Colon: No focal abnormality. --Appendix: Normal. Vascular/Lymphatic: Normal course and caliber of the major abdominal vessels. No abdominal or pelvic lymphadenopathy. Reproductive: There are multiple large uterine fibroids. The largest is at the anterior uterine fundus and measures 8.4 x 7.0 cm. Musculoskeletal. No bony spinal canal stenosis or focal osseous abnormality. Other: None. IMPRESSION: 1. No acute abnormality of the chest, abdomen or pelvis. 2. Multiple large uterine fibroids. 3. Subcentimeter hypodensity in the right kidney is too small characterize accurately but statistically is most likely a renal cyst. 4. Nonobstructive 2 mm calculus at the left kidney lower pole. Electronically Signed   By: Ulyses Jarred M.D.   On: 12/17/2017 19:35   Ct Abdomen Pelvis W Contrast  Result Date: 12/17/2017 CLINICAL DATA:  Chest pain and left lower quadrant abdominal pain. Wheezing. EXAM: CT CHEST, ABDOMEN, AND PELVIS WITH CONTRAST TECHNIQUE: Multidetector CT imaging of the chest, abdomen and pelvis was performed following the standard protocol during bolus administration of intravenous contrast. CONTRAST:  170mL ISOVUE-300  IOPAMIDOL (ISOVUE-300) INJECTION 61% COMPARISON:  Chest radiograph 12/15/2017 FINDINGS: CT CHEST FINDINGS Cardiovascular: No significant vascular findings. Normal heart size. No pericardial effusion. Mediastinum/Nodes: No enlarged mediastinal, hilar, or axillary lymph nodes. Thyroid gland, trachea, and esophagus demonstrate no significant findings. Lungs/Pleura: Lungs are clear. No pleural effusion or pneumothorax. Musculoskeletal: No chest wall mass or suspicious bone lesions identified. CT ABDOMEN PELVIS FINDINGS Hepatobiliary: Normal hepatic contours and density. No visible biliary dilatation. Normal gallbladder. Pancreas: Normal parenchymal contours without ductal dilatation. No peripancreatic fluid collection. Spleen: Normal. Adrenals/Urinary Tract: --Adrenal glands: Normal. --Right kidney/ureter: A subcentimeter hypodensity in the right kidney is too small characterize accurately. No hydronephrosis or nephrolithiasis. --Left kidney/ureter: 2 mm lower pole nonobstructing calculus. No hydronephrosis. --Urinary bladder: Normal appearance for the degree of distention. Stomach/Bowel: --Stomach/Duodenum: No hiatal hernia or other gastric abnormality. Normal duodenal course. --Small bowel:  No dilatation or inflammation. --Colon: No focal abnormality. --Appendix: Normal. Vascular/Lymphatic: Normal course and caliber of the major abdominal vessels. No abdominal or pelvic lymphadenopathy. Reproductive: There are multiple large uterine fibroids. The largest is at the anterior uterine fundus and measures 8.4 x 7.0 cm. Musculoskeletal. No bony spinal canal stenosis or focal osseous abnormality. Other: None. IMPRESSION: 1. No acute abnormality of the chest, abdomen or pelvis. 2. Multiple large uterine fibroids. 3. Subcentimeter hypodensity in the right kidney is too small characterize accurately but statistically is most likely a renal cyst. 4. Nonobstructive 2 mm calculus at the left kidney lower pole. Electronically  Signed   By: Ulyses Jarred M.D.   On: 12/17/2017 19:35    Procedures Procedures (including critical care time)  Medications Ordered in ED Medications  LORazepam (ATIVAN) injection 1 mg (1 mg Intravenous Given 12/17/17 1622)  iopamidol (ISOVUE-300) 61 % injection (30 mLs Oral Contrast Given 12/17/17 1722)  iopamidol (ISOVUE-300) 61 % injection (100 mLs  Contrast Given 12/17/17 1907)     Initial Impression / Assessment and Plan / ED Course  I have reviewed the triage vital signs and the nursing notes.  Pertinent labs & imaging results that were available during my care of the patient were reviewed by me and considered in my medical decision making (see chart for details).     Vitals:   12/17/17 1700 12/17/17 1735 12/17/17 1830 12/17/17 1900  BP: 128/77  131/82 126/79  Pulse: 70 (!) 56 61 60  Resp: (!) 22 19 18 19   Temp:      TempSrc:      SpO2: 97% 100% 98% 97%    Medications  LORazepam (ATIVAN) injection 1 mg (1 mg Intravenous Given 12/17/17 1622)  iopamidol (ISOVUE-300) 61 % injection (30 mLs Oral Contrast Given 12/17/17 1722)  iopamidol (ISOVUE-300) 61 % injection (100 mLs  Contrast Given 12/17/17 1907)    Ashley Archer is 47 y.o. female presenting with shortness of breath, pruritus, abdominal pain and chest pain.  She gave herself the epinephrine injection at 230 this afternoon.  She has had multiple visits to the ED in the last week for possible allergic reactions.  On my exam patient with no angioedema, lung sounds clear and full.  No hives, no vomiting.  She is extremely anxious likely secondary to the epinephrine, a small dose of Ativan administered with good relief.  Will check basic blood work, obtain EKG, CT abdomen pelvis and chest to look for alternative pathology.  Patient was observed in the ED until 19:45 with no recurrence of any allergic symptoms.  I have advised her that she will need to follow closely with primary care, I have advised her that she needs to refill  her EpiPen and can return to the ED for any concerning symptoms I have advised her that she will need to kill forward with the allergy testing but I am not at all convinced that this is a true anaphylactic reaction.  Evaluation does not show pathology that would require ongoing emergent intervention or inpatient treatment. Pt is hemodynamically stable and mentating appropriately. Discussed findings and plan with patient/guardian, who agrees with care plan. All questions answered. Return precautions discussed and outpatient follow up given.     Final Clinical Impressions(s) / ED Diagnoses   Final diagnoses:  Atypical chest pain  Abdominal pain, unspecified abdominal location    ED Discharge Orders    None       Waynetta Pean 12/17/17 1952  Jola Schmidt, MD 12/17/17 2216

## 2017-12-17 NOTE — Discharge Instructions (Signed)
Please follow with your primary care doctor in the next 2 days for a check-up. They must obtain records for further management.  ° °Do not hesitate to return to the Emergency Department for any new, worsening or concerning symptoms.  ° °

## 2017-12-17 NOTE — ED Triage Notes (Signed)
Per EMS, pt had an allergic reaction since this morning. Pt reports she had hives and wheezing. Pt took benadryl this morning, gave herself an Epipen at 1425 today.

## 2017-12-17 NOTE — ED Provider Notes (Signed)
Complains of chest pain abdominal pain and wheezing onset 10:40 AM today.  Patient has been treated for "allergic reaction" over the past 1 week with Benadryl and steroids and EpiPen.  She admits to feeling anxious at present today she developed wheezing at 10:40 AM followed by hives and chest pain abdominal pain and headache.  Pain is constant.  She feels that she suffered another "allergic reaction" on exam she is in no distress HEENT exam no facial asymmetry no mucosal lesion neck is supple trachea midline lungs clear to auscultation heart regular rate and rhythm chest is tender anteriorly reproducing pain exactly abdomen soft nontender skin warm and dry no rash.  Neurologic Glasgow Coma Score 15 cranial nerves II through XII grossly intact.  Moves all extremities well   Orlie Dakin, MD 12/17/17 [phone]

## 2017-12-19 DIAGNOSIS — Z889 Allergy status to unspecified drugs, medicaments and biological substances status: Secondary | ICD-10-CM | POA: Diagnosis not present

## 2017-12-19 DIAGNOSIS — R0602 Shortness of breath: Secondary | ICD-10-CM | POA: Diagnosis not present

## 2017-12-19 DIAGNOSIS — Z6830 Body mass index (BMI) 30.0-30.9, adult: Secondary | ICD-10-CM | POA: Diagnosis not present

## 2017-12-20 DIAGNOSIS — H401131 Primary open-angle glaucoma, bilateral, mild stage: Secondary | ICD-10-CM | POA: Diagnosis not present

## 2017-12-31 ENCOUNTER — Ambulatory Visit: Payer: 59 | Admitting: Allergy and Immunology

## 2017-12-31 ENCOUNTER — Encounter: Payer: Self-pay | Admitting: Allergy and Immunology

## 2017-12-31 VITALS — BP 110/66 | HR 72 | Temp 98.5°F | Resp 16 | Ht 61.02 in | Wt 159.0 lb

## 2017-12-31 DIAGNOSIS — J3089 Other allergic rhinitis: Secondary | ICD-10-CM | POA: Diagnosis not present

## 2017-12-31 DIAGNOSIS — T7840XA Allergy, unspecified, initial encounter: Secondary | ICD-10-CM | POA: Insufficient documentation

## 2017-12-31 DIAGNOSIS — L500 Allergic urticaria: Secondary | ICD-10-CM

## 2017-12-31 DIAGNOSIS — H1013 Acute atopic conjunctivitis, bilateral: Secondary | ICD-10-CM | POA: Diagnosis not present

## 2017-12-31 DIAGNOSIS — T7840XD Allergy, unspecified, subsequent encounter: Secondary | ICD-10-CM

## 2017-12-31 DIAGNOSIS — H1010 Acute atopic conjunctivitis, unspecified eye: Secondary | ICD-10-CM | POA: Insufficient documentation

## 2017-12-31 MED ORDER — AZELASTINE-FLUTICASONE 137-50 MCG/ACT NA SUSP: 1.0000 | Freq: Two times a day (BID) | NASAL | 5 refills | Status: DC | PRN

## 2017-12-31 MED ORDER — OLOPATADINE HCL 0.7 % OP SOLN: 1.0000 [drp] | Freq: Every day | OPHTHALMIC | 5 refills | Status: DC | PRN

## 2017-12-31 MED ORDER — LEVOCETIRIZINE DIHYDROCHLORIDE 5 MG PO TABS: 5.0000 mg | ORAL_TABLET | Freq: Every evening | ORAL | 5 refills | Status: DC

## 2017-12-31 NOTE — Assessment & Plan Note (Addendum)
The patients history suggests allergic reaction with an unclear trigger. Food allergen skin tests were negative today despite a positive histamine control. The negative predictive value for skin tests is excellent (greater than 95%). We will proceed with in vitro lab studies to help establish an etiology.  The following labs have been ordered: FCeRI antibody, anti-thyroglobulin antibody, thyroid peroxidase antibody, C4, baseline serum tryptase, CBC, CMP, ESR, ANA, and serum specific IgE against galactose-alpha-1,3-galactose panel.   For now, take levocetirizine 5 mg daily.  Should symptoms recur, a journal is to be kept recording any foods eaten, beverages consumed, medications taken within a 6 hour period prior to the onset of symptoms, as well as activities performed, and environmental conditions. For any symptoms concerning for anaphylaxis, epinephrine is to be administered and 911 is to be called immediately.

## 2017-12-31 NOTE — Assessment & Plan Note (Signed)
   Treatment plan as outlined above for allergic rhinitis.  A prescription has been provided for Pazeo, one drop per eye daily as needed.  I have also recommended eye lubricant drops (i.e., Natural Tears) as needed. 

## 2017-12-31 NOTE — Patient Instructions (Addendum)
Allergic reaction The patients history suggests allergic reaction with an unclear trigger. Food allergen skin tests were negative today despite a positive histamine control. The negative predictive value for skin tests is excellent (greater than 95%). We will proceed with in vitro lab studies to help establish an etiology.  The following labs have been ordered: FCeRI antibody, anti-thyroglobulin antibody, thyroid peroxidase antibody, C4, baseline serum tryptase, CBC, CMP, ESR, ANA, and serum specific IgE against galactose-alpha-1,3-galactose panel.   For now, take levocetirizine 5 mg daily.  Should symptoms recur, a journal is to be kept recording any foods eaten, beverages consumed, medications taken within a 6 hour period prior to the onset of symptoms, as well as activities performed, and environmental conditions. For any symptoms concerning for anaphylaxis, epinephrine is to be administered and 911 is to be called immediately.  Seasonal allergic rhinitis  Aeroallergen avoidance measures have been discussed and provided in written form.  A prescription has been provided for levocetirizine, 5 mg daily as needed.  A prescription has been provided for Dymista (azelastine/fluticasone) nasal spray, 1 spray per nostril twice daily as needed. Proper nasal spray technique has been discussed and demonstrated.  Nasal saline spray (i.e., Simply Saline) or nasal saline lavage (i.e., NeilMed) is recommended as needed and prior to medicated nasal sprays.  If allergen avoidance measures and medications fail to adequately relieve symptoms, aeroallergen immunotherapy will be considered.  Allergic conjunctivitis  Treatment plan as outlined above for allergic rhinitis.  A prescription has been provided for Pazeo, one drop per eye daily as needed.  I have also recommended eye lubricant drops (i.e., Natural Tears) as needed.   When lab results have returned the patient will be called with further  recommendations and follow up instructions.  Reducing Pollen Exposure  The American Academy of All[employer]hma and Immun[employer] suggests the following steps to reduce your exposure to pollen during allergy seasons.    1. Do not hang sheets or clothing out to dry; pollen may collect on these items. 2. Do not mow lawns or spend time around freshly cut grass; mowing stirs up pollen. 3. Keep windows closed at night.  Keep car windows closed while driving. 4. Minimize morning activities outdoors, a time when pollen counts are usually at their highest. 5. Stay indoors as much as possible when pollen counts or humidity is high and on windy days when pollen tends to remain in the air longer. 6. Use air conditioning when possible.  Many air conditioners have filters that trap the pollen spores. 7. Use a HEPA room air filter to remove pollen form the indoor air you breathe.

## 2017-12-31 NOTE — Progress Notes (Addendum)
New Patient Note  RE: Ashley Archer MRN: [id number] DOB: 07/08/1971 Date of Office Visit: 12/31/2017  Referring provider: Shon Baton, MD Primary care provider: Shon Baton, MD  Chief Complaint: Allergic Reaction; Allergic Rhinitis ; and Conjunctivitis   History of present illness: Ashley Archer is a 47 y.o. female seen today in consultation requested by Shon Baton, MD.  She reports that on March 2 she was started on a 10-day course of Ceftin ear for sinus infection.  She reports that 4 days into the course of the antibiotic she felt a "full" sensation in her throat and had mild difficulty swallowing.  She continue the antibiotic and a few days later experienced the same sensation in her throat.  She finished the course of antibiotics on March 12 and on the night of March 13 developed generalized pruritus before going to bed.  She woke up in the morning with a full sensation in her throat and "hives everywhere."  The hives were described as red, raised, and itchy.  The hives resolved over the next few hours and she received a corticosteroid injection and prednisone at her primary care physician's office.  On the morning of Friday, March 15, she experienced pruritus in her axillae, lip angioedema, urticarial lesions on her arms and thighs dyspnea, nausea, vomiting, diarrhea, and an "awful, awful, awful headache."  She went to the urgent care and was administered IM epinephrine and sent to the emergency department.  She reports that her blood pressure was read as low and she received another dose of epinephrine as well as IV corticosteroids and diphenhydramine.  She reports that on March 17 she woke up with hives on her arms and chest as well as swelling of the right side of her face, dizziness, and nausea.  She went to the emergency department and was treated with epinephrine, systemic steroids, and diphenhydramine.  She was prescribed an epinephrine autoinjector 2 pack at that time.  On March 19  in the early afternoon she developed nausea, headache, dizziness, and fell asleep.  Her husband woke her up because he heard her wheezing.  Upon awakening she was very weak and noted hives on her chest.  Her husband administered epinephrine and she was transported to the ER via ambulance.  Since March 19, she has experienced recurrent episodes of nausea and had experienced pruritus of her palms and soles until 4 days ago. Jaeliana experiences nasal congestion, rhinorrhea, postnasal drainage, nasal pruritus, ocular pruritus, and occasional sinus pressure.  She attempts to control the symptoms with fexofenadine or loratadine.  Assessment and plan: Allergic reaction The patients history suggests allergic reaction with an unclear trigger. Food allergen skin tests were negative today despite a positive histamine control. The negative predictive value for skin tests is excellent (greater than 95%). We will proceed with in vitro lab studies to help establish an etiology.  The following labs have been ordered: FCeRI antibody, anti-thyroglobulin antibody, thyroid peroxidase antibody, C4, baseline serum tryptase, CBC, CMP, ESR, ANA, and serum specific IgE against galactose-alpha-1,3-galactose panel.   For now, take levocetirizine 5 mg daily.  Should symptoms recur, a journal is to be kept recording any foods eaten, beverages consumed, medications taken within a 6 hour period prior to the onset of symptoms, as well as activities performed, and environmental conditions. For any symptoms concerning for anaphylaxis, epinephrine is to be administered and 911 is to be called immediately.  Seasonal allergic rhinitis  Aeroallergen avoidance measures have been discussed and provided in written  form.  A prescription has been provided for levocetirizine, 5 mg daily as needed.  A prescription has been provided for Dymista (azelastine/fluticasone) nasal spray, 1 spray per nostril twice daily as needed. Proper nasal spray  technique has been discussed and demonstrated.  Nasal saline spray (i.e., Simply Saline) or nasal saline lavage (i.e., NeilMed) is recommended as needed and prior to medicated nasal sprays.  If allergen avoidance measures and medications fail to adequately relieve symptoms, aeroallergen immunotherapy will be considered.  Allergic conjunctivitis  Treatment plan as outlined above for allergic rhinitis.  A prescription has been provided for Pazeo, one drop per eye daily as needed.  I have also recommended eye lubricant drops (i.e., Natural Tears) as needed.   Meds ordered this encounter  Medications  . levocetirizine (XYZAL) 5 MG tablet    Sig: Take 1 tablet (5 mg total) by mouth every evening.    Dispense:  30 tablet    Refill:  5  . Olopatadine HCl (PAZEO) 0.7 % SOLN    Sig: Apply 1 drop to eye daily as needed.    Dispense:  2.5 mL    Refill:  5  . Azelastine-Fluticasone (DYMISTA) 137-50 MCG/ACT SUSP    Sig: Place 1 spray into the nose 2 (two) times daily as needed.    Dispense:  23 g    Refill:  5    Diagnostics: Epicutaneous testing: Positive to grass pollen, weed pollen, and tree pollen. Intradermal testing: Positive to ragweed pollen. Food allergen skin testing: Negative despite a positive histamine control.    Physical examination: Blood pressure 110/66, pulse 72, temperature 98.5 F (36.9 C), temperature source Oral, resp. rate 16, height 5' 1.02" (1.55 m), weight 159 lb (72.1 kg), last menstrual period 12/07/2017, SpO2 98 %.  General: Alert, interactive, in no acute distress. HEENT: TMs pearly gray, turbinates moderately edematous with clear discharge, post-pharynx mildly erythematous. Neck: Supple without lymphadenopathy. Lungs: Clear to auscultation without wheezing, rhonchi or rales. CV: Normal S1, S2 without murmurs. Abdomen: Nondistended, nontender. Skin: Warm and dry, without lesions or rashes. Extremities:  No clubbing, cyanosis or edema. Neuro:   Grossly  intact.  Review of systems:  Review of systems negative except as noted in HPI / PMHx or noted below: Review of Systems  Constitutional: Negative.   HENT: Negative.   Eyes: Negative.   Respiratory: Negative.   Cardiovascular: Negative.   Gastrointestinal: Negative.   Genitourinary: Negative.   Musculoskeletal: Negative.   Skin: Negative.   Neurological: Negative.   Endo/Heme/Allergies: Negative.   Psychiatric/Behavioral: Negative.     Past medical history:  Past Medical History:  Diagnosis Date  . CIN II (cervical intraepithelial neoplasia II)   . Fibroid   . GERD (gastroesophageal reflux disease)   . Urticaria     Past surgical history:  Past Surgical History:  Procedure Laterality Date  . CERVICAL BIOPSY  W/ LOOP ELECTRODE EXCISION  2001   CIN ll  . DILATION AND CURETTAGE OF UTERUS    . Thumb Surg      Family history: Family History  Problem Relation Age of Onset  . Hypertension Mother   . Cancer Mother 61       cervical  . Pancreatic cancer Father   . Cancer Father 45       pancreatic  . Diabetes Maternal Aunt   . Diabetes Maternal Grandmother     Social history: Social History   Socioeconomic History  . Marital status: Married    Spouse name: Not  on file  . Number of children: Not on file  . Years of education: Not on file  . Highest education level: Not on file  Occupational History  . Not on file  Social Needs  . Financial resource strain: Not on file  . Food insecurity:    Worry: Not on file    Inability: Not on file  . Transportation needs:    Medical: Not on file    Non-medical: Not on file  Tobacco Use  . Smoking status: Never Smoker  . Smokeless tobacco: Never Used  Substance and Sexual Activity  . Alcohol use: No  . Drug use: No  . Sexual activity: Yes    Birth control/protection: Condom  Lifestyle  . Physical activity:    Days per week: Not on file    Minutes per session: Not on file  . Stress: Not on file  Relationships    . Social connections:    Talks on phone: Not on file    Gets together: Not on file    Attends religious service: Not on file    Active member of club or organization: Not on file    Attends meetings of clubs or organizations: Not on file    Relationship status: Not on file  . Intimate partner violence:    Fear of current or ex partner: Not on file    Emotionally abused: Not on file    Physically abused: Not on file    Forced sexual activity: Not on file  Other Topics Concern  . Not on file  Social History Narrative  . Not on file   Environmental History: Patient lives in a 46 year old house with carpeting throughout, gas heat, and central air.  She is a non-smoker without pets.  There is no known mold/water damage in the home.  Allergies as of 12/31/2017      Reactions   Cefdinir Anaphylaxis, Shortness Of Breath   Coconut Oil Swelling, Rash   Hydrocodone Rash      Medication List        Accurate as of 12/31/17  6:10 PM. Always use your most recent med list.          ALIGN PO Take 1 capsule by mouth daily.   Azelastine-Fluticasone 137-50 MCG/ACT Susp Commonly known as:  DYMISTA Place 1 spray into the nose 2 (two) times daily as needed.   diphenhydrAMINE 25 MG tablet Commonly known as:  BENADRYL Take 50 mg by mouth daily as needed for itching (HIVES).   EPINEPHrine 0.3 mg/0.3 mL Soaj injection Commonly known as:  EPIPEN 2-PAK Inject 0.3 mLs (0.3 mg total) into the muscle once as needed for up to 1 dose (for severe allergic reaction). CAll 911 immediately if you have to use this medicine   famotidine 20 MG tablet Commonly known as:  PEPCID Take 1 tablet (20 mg total) by mouth 2 (two) times daily.   feeding supplement Liqd Take 1 Container by mouth daily.   ibuprofen 600 MG tablet Commonly known as:  ADVIL,MOTRIN Take 1 tablet (600 mg total) by mouth every 6 (six) hours as needed.   levocetirizine 5 MG tablet Commonly known as:  XYZAL Take 1 tablet (5 mg total)  by mouth every evening.   loratadine 10 MG tablet Commonly known as:  CLARITIN Take 10 mg by mouth daily as needed for allergies.   LUMIFY 0.025 % Soln Generic drug:  Brimonidine Tartrate Place 1 drop into both eyes daily.   mometasone 50  MCG/ACT nasal spray Commonly known as:  NASONEX Place 2 sprays into the nose daily as needed (allergies).   Olopatadine HCl 0.7 % Soln Commonly known as:  PAZEO Apply 1 drop to eye daily as needed.   ondansetron 8 MG tablet Commonly known as:  ZOFRAN as needed.   traMADol 50 MG tablet Commonly known as:  ULTRAM Take 1 tablet (50 mg total) by mouth every 6 (six) hours as needed.   travoprost (benzalkonium) 0.004 % ophthalmic solution Commonly known as:  TRAVATAN Place 1 drop into both eyes at bedtime.   VITAMIN E PO Take 1 tablet by mouth daily.       Known medication allergies: Allergies  Allergen Reactions  . Cefdinir Anaphylaxis and Shortness Of Breath  . Coconut Oil Swelling and Rash  . Hydrocodone Rash    I appreciate the opportunity to take part in Annakate's care. Please do not hesitate to contact me with questions.  Sincerely,   R. Edgar Frisk, MD

## 2017-12-31 NOTE — Assessment & Plan Note (Signed)
   Aeroallergen avoidance measures have been discussed and provided in written form.  A prescription has been provided for levocetirizine, 5 mg daily as needed.  A prescription has been provided for Dymista (azelastine/fluticasone) nasal spray, 1 spray per nostril twice daily as needed. Proper nasal spray technique has been discussed and demonstrated.  Nasal saline spray (i.e., Simply Saline) or nasal saline lavage (i.e., NeilMed) is recommended as needed and prior to medicated nasal sprays.  If allergen avoidance measures and medications fail to adequately relieve symptoms, aeroallergen immunotherapy will be considered. 

## 2018-01-01 ENCOUNTER — Telehealth: Payer: Self-pay

## 2018-01-01 NOTE — Telephone Encounter (Signed)
The patient's insurance will not cover Dymista. Can we split the medication?

## 2018-01-01 NOTE — Telephone Encounter (Signed)
Yes

## 2018-01-02 MED ORDER — FLUTICASONE PROPIONATE 50 MCG/ACT NA SUSP: 1.0000 | Freq: Two times a day (BID) | NASAL | 5 refills | Status: DC

## 2018-01-02 MED ORDER — AZELASTINE HCL 0.1 % NA SOLN: 1.0000 | Freq: Two times a day (BID) | NASAL | 5 refills | Status: DC

## 2018-01-02 NOTE — Telephone Encounter (Signed)
Prescriptions sent in patient notified that Dymista had to be split. Patient verbalized understanding and asked about lab results advised those were not back yet.

## 2018-01-02 NOTE — Addendum Note (Signed)
Addended by: Orlene Erm on: 01/02/2018 09:12 AM   Modules accepted: Orders

## 2018-01-03 LAB — COMPREHENSIVE METABOLIC PANEL
ALT: 16 IU/L (ref 0–32)
AST: 14 IU/L (ref 0–40)
Albumin/Globulin Ratio: 1.5 (ref 1.2–2.2)
Albumin: 4.4 g/dL (ref 3.5–5.5)
Alkaline Phosphatase: 49 IU/L (ref 39–117)
BUN/Creatinine Ratio: 26 — ABNORMAL HIGH (ref 9–23)
BUN: 21 mg/dL (ref 6–24)
Bilirubin Total: 0.5 mg/dL (ref 0.0–1.2)
CO2: 24 mmol/L (ref 20–29)
Calcium: 9.5 mg/dL (ref 8.7–10.2)
Chloride: 101 mmol/L (ref 96–106)
Creatinine, Ser: 0.8 mg/dL (ref 0.57–1.00)
GFR calc Af Amer: 103 mL/min/{1.73_m2} (ref >59)
GFR calc non Af Amer: 89 mL/min/{1.73_m2} (ref >59)
Globulin, Total: 2.9 g/dL (ref 1.5–4.5)
Glucose: 88 mg/dL (ref 65–99)
Potassium: 4.3 mmol/L (ref 3.5–5.2)
Sodium: 141 mmol/L (ref 134–144)
Total Protein: 7.3 g/dL (ref 6.0–8.5)

## 2018-01-03 LAB — CBC WITH DIFFERENTIAL/PLATELET
Basophils Absolute: 0 10*3/uL (ref 0.0–0.2)
Basos: 0 %
EOS (ABSOLUTE): 0 10*3/uL (ref 0.0–0.4)
Eos: 1 %
Hematocrit: 42.3 % (ref 34.0–46.6)
Hemoglobin: 14.2 g/dL (ref 11.1–15.9)
Immature Grans (Abs): 0 10*3/uL (ref 0.0–0.1)
Immature Granulocytes: 0 %
Lymphocytes Absolute: 2.1 10*3/uL (ref 0.7–3.1)
Lymphs: 38 %
MCH: 31.1 pg (ref 26.6–33.0)
MCHC: 33.6 g/dL (ref 31.5–35.7)
MCV: 93 fL (ref 79–97)
Monocytes Absolute: 0.3 10*3/uL (ref 0.1–0.9)
Monocytes: 5 %
Neutrophils Absolute: 3.2 10*3/uL (ref 1.4–7.0)
Neutrophils: 56 %
Platelets: 202 10*3/uL (ref 150–379)
RBC: 4.57 x10E6/uL (ref 3.77–5.28)
RDW: 12.7 % (ref 12.3–15.4)
WBC: 5.6 10*3/uL (ref 3.4–10.8)

## 2018-01-03 LAB — ALPHA-GAL PANEL
Alpha Gal IgE*: <0.1 kU/L (ref <0.10)
Beef (Bos spp) IgE: <0.1 kU/L (ref <0.35)
Class Interpretation: 0
Class Interpretation: 0
Class Interpretation: 0
Lamb/Mutton (Ovis spp) IgE: <0.1 kU/L (ref <0.35)
Pork (Sus spp) IgE: <0.1 kU/L (ref <0.35)

## 2018-01-03 LAB — THYROID PEROXIDASE ANTIBODY: Thyroperoxidase Ab SerPl-aCnc: 12 IU/mL (ref 0–34)

## 2018-01-03 LAB — SEDIMENTATION RATE: Sed Rate: 8 mm/hr (ref 0–32)

## 2018-01-03 LAB — ANA: Anti Nuclear Antibody(ANA): POSITIVE — AB

## 2018-01-03 LAB — TRYPTASE: Tryptase: 4.2 ug/L (ref 2.2–13.2)

## 2018-01-03 LAB — C4 COMPLEMENT: Complement C4, Serum: 31 mg/dL (ref 14–44)

## 2018-01-03 LAB — THYROGLOBULIN ANTIBODY: Thyroglobulin Antibody: <1 IU/mL (ref 0.0–0.9)

## 2018-01-09 DIAGNOSIS — Z6829 Body mass index (BMI) 29.0-29.9, adult: Secondary | ICD-10-CM | POA: Diagnosis not present

## 2018-01-09 DIAGNOSIS — Z01419 Encounter for gynecological examination (general) (routine) without abnormal findings: Secondary | ICD-10-CM | POA: Diagnosis not present

## 2018-01-14 ENCOUNTER — Ambulatory Visit: Payer: Self-pay | Admitting: Allergy and Immunology

## 2018-01-16 ENCOUNTER — Telehealth: Payer: Self-pay | Admitting: Allergy and Immunology

## 2018-01-16 ENCOUNTER — Other Ambulatory Visit: Payer: Self-pay

## 2018-01-16 MED ORDER — OLOPATADINE HCL 0.2 % OP SOLN: OPHTHALMIC | 5 refills | Status: DC

## 2018-01-16 MED ORDER — FLUTICASONE PROPIONATE 50 MCG/ACT NA SUSP: NASAL | 5 refills | Status: DC

## 2018-01-16 NOTE — Telephone Encounter (Signed)
Nasal congestion,cough,sneezing and nose and throat itching states she is taking levocetirizine 5mg  and using azelastine. Patient did not pick up fluticasone she was unaware there was another nasal spray ordered. Advised patient to pick this up also patient states she did not pick up Pazeo eyedrops due to cost. Dr Verlin Fester is there something else she can take or use. Please advise.

## 2018-01-16 NOTE — Telephone Encounter (Signed)
Sent in pataday and fluticasone.

## 2018-01-16 NOTE — Telephone Encounter (Signed)
Sent in pataday and fluticasone to pt.'s pharmacy.

## 2018-01-16 NOTE — Telephone Encounter (Signed)
Patient would like to know if she can take any other type of allergy medication along with her Xyzal and nasal spray.

## 2018-01-16 NOTE — Telephone Encounter (Signed)
Yes, have her pick up and start using the fluticasone nasal spray.  If her insurance covers Pataday, send that in. Thanks.

## 2018-01-20 NOTE — Telephone Encounter (Signed)
Scripts sent in by [place].

## 2018-01-20 NOTE — Telephone Encounter (Signed)
Yes, please switch

## 2018-01-20 NOTE — Telephone Encounter (Signed)
Patient's insurance prefers mometasone nasal spray over the fluticasone. Is it okay to switch? Please advise and thank you.

## 2018-02-07 ENCOUNTER — Other Ambulatory Visit: Payer: Self-pay | Admitting: Obstetrics and Gynecology

## 2018-02-07 DIAGNOSIS — Z1231 Encounter for screening mammogram for malignant neoplasm of breast: Secondary | ICD-10-CM

## 2018-02-11 ENCOUNTER — Ambulatory Visit (INDEPENDENT_AMBULATORY_CARE_PROVIDER_SITE_OTHER): Payer: 59 | Admitting: Allergy and Immunology

## 2018-02-11 ENCOUNTER — Telehealth: Payer: Self-pay | Admitting: Allergy and Immunology

## 2018-02-11 ENCOUNTER — Encounter: Payer: Self-pay | Admitting: Allergy and Immunology

## 2018-02-11 DIAGNOSIS — T783XXD Angioneurotic edema, subsequent encounter: Secondary | ICD-10-CM

## 2018-02-11 DIAGNOSIS — J3089 Other allergic rhinitis: Secondary | ICD-10-CM

## 2018-02-11 DIAGNOSIS — L500 Allergic urticaria: Secondary | ICD-10-CM

## 2018-02-11 DIAGNOSIS — T783XXA Angioneurotic edema, initial encounter: Secondary | ICD-10-CM | POA: Insufficient documentation

## 2018-02-11 DIAGNOSIS — H1013 Acute atopic conjunctivitis, bilateral: Secondary | ICD-10-CM | POA: Diagnosis not present

## 2018-02-11 HISTORY — DX: Angioneurotic edema, initial encounter: T78.3XXA

## 2018-02-11 MED ORDER — MONTELUKAST SODIUM 10 MG PO TABS: 10.0000 mg | ORAL_TABLET | Freq: Every day | ORAL | 3 refills | Status: DC

## 2018-02-11 NOTE — Telephone Encounter (Signed)
Scheduled an appointment for 11:15

## 2018-02-11 NOTE — Assessment & Plan Note (Signed)
Associated angioedema occurs in up to 50% of patients with chronic urticaria.  History suggests that this episode may have been precipitated by viral infection.  Treatment plan as outlined above.

## 2018-02-11 NOTE — Assessment & Plan Note (Signed)
   Continue appropriate allergen avoidance measures, levocetirizine, Dymista if needed, and nasal saline irrigation as needed.  Montelukast has been prescribed (as above).  If allergen avoidance measures and medications fail to adequately relieve symptoms, aeroallergen immunotherapy will be considered.

## 2018-02-11 NOTE — Assessment & Plan Note (Signed)
   A prescription has been provided for montelukast 10 mg daily at bedtime.  To avoid diminishing benefit with daily use (tachyphylaxis) of second generation antihistamine, consider alternating every few months between fexofenadine (Allegra) and levocetirizine (Xyzal).  Should symptoms recur, a journal is to be kept recording any foods eaten, beverages consumed, and medications taken within a 6 hour time period prior to the onset of symptoms, as well as record activities being performed, and environmental conditions. For any symptoms concerning for anaphylaxis, epinephrine is to be administered and 911 is to be called immediately.

## 2018-02-11 NOTE — Assessment & Plan Note (Signed)
   Treatment plan as outlined above for allergic rhinitis.  For now, continue Pazeo, one drop per eye daily as needed.  I have also recommended eye lubricant drops (i.e., Natural Tears) as needed.

## 2018-02-11 NOTE — Patient Instructions (Addendum)
Idiopathic urticaria  A prescription has been provided for montelukast 10 mg daily at bedtime.  To avoid diminishing benefit with daily use (tachyphylaxis) of second generation antihistamine, consider alternating every few months between fexofenadine (Allegra) and levocetirizine (Xyzal).  Should symptoms recur, a journal is to be kept recording any foods eaten, beverages consumed, and medications taken within a 6 hour time period prior to the onset of symptoms, as well as record activities being performed, and environmental conditions. For any symptoms concerning for anaphylaxis, epinephrine is to be administered and 911 is to be called immediately.  Angioedema Associated angioedema occurs in up to 50% of patients with chronic urticaria.  History suggests that this episode may have been precipitated by viral infection.  Treatment plan as outlined above.  Seasonal allergic rhinitis  Continue appropriate allergen avoidance measures, levocetirizine, Dymista if needed, and nasal saline irrigation as needed.  Montelukast has been prescribed (as above).  If allergen avoidance measures and medications fail to adequately relieve symptoms, aeroallergen immunotherapy will be considered.  Allergic conjunctivitis  Treatment plan as outlined above for allergic rhinitis.  For now, continue Pazeo, one drop per eye daily as needed.  I have also recommended eye lubricant drops (i.e., Natural Tears) as needed.   Return in about 6 months (around 08/14/2018), or if symptoms worsen or fail to improve.   Urticaria (Hives)  . Levocetirizine (Xyzal) 5 mg twice a day and ranitidine (Zantac) 150 mg twice a day. If no symptoms for 7-14 days then decrease to. . Levocetirizine (Xyzal) 5 mg twice a day and ranitidine (Zantac) 150 mg once a day.  If no symptoms for 7-14 days then decrease to. . Levocetirizine (Xyzal) 5 mg twice a day.  If no symptoms for 7-14 days then decrease to. . Levocetirizine (Xyzal) 5  mg once a day.  May use Benadryl (diphenhydramine) as needed for breakthrough symptoms       If symptoms return, then step up dosage

## 2018-02-11 NOTE — Progress Notes (Signed)
Follow-up Note  RE: Ashley Archer MRN: [id number] DOB: June 05, 1971 Date of Office Visit: 05/14/Ashley Archer  Primary care provider: Shon Baton, MD Referring provider: Shon Baton, MD  History of present illness: Ashley Archer is a 47 y.o. female with allergic rhinoconjunctivitis and history of allergic reaction presenting today for follow-up.  She was previously seen in this clinic for her initial evaluation on Ashley Archer, Ashley Archer.  She reports that, despite taking levocetirizine 5 mg daily, approximately 3 weeks ago she developed hives on her arms and chest.  She took diphenhydramine and the hives resolved without residual pigmentation or bruising within 24 hours.  She did not experience concomitant angioedema, cardiopulmonary symptoms, or GI symptoms.  3 days ago, she felt nauseated, feverish, and had "a really bad headache."  She reports that the fever and headache resolved by that evening, however the next morning she woke up with swelling of the upper lip.  She did not experience concomitant urticaria, cardiopulmonary symptoms, or other GI symptoms.  She has no nasal allergy symptom complaints today.  She does report that she experiences ocular pruritus.   Assessment and plan: Idiopathic urticaria  A prescription has been provided for montelukast 10 mg daily at bedtime.  To avoid diminishing benefit with daily use (tachyphylaxis) of second generation antihistamine, consider alternating every few months between fexofenadine (Allegra) and levocetirizine (Xyzal).  Should symptoms recur, a journal is to be kept recording any foods eaten, beverages consumed, and medications taken within a 6 hour time period prior to the onset of symptoms, as well as record activities being performed, and environmental conditions. For any symptoms concerning for anaphylaxis, epinephrine is to be administered and 911 is to be called immediately.  Angioedema Associated angioedema occurs in up to 50% of patients with  chronic urticaria.  History suggests that this episode may have been precipitated by viral infection.  Treatment plan as outlined above.  Seasonal allergic rhinitis  Continue appropriate allergen avoidance measures, levocetirizine, Dymista if needed, and nasal saline irrigation as needed.  Montelukast has been prescribed (as above).  If allergen avoidance measures and medications fail to adequately relieve symptoms, aeroallergen immunotherapy will be considered.  Allergic conjunctivitis  Treatment plan as outlined above for allergic rhinitis.  For now, continue Pazeo, one drop per eye daily as needed.  I have also recommended eye lubricant drops (i.e., Natural Tears) as needed.   Meds ordered this encounter  Medications  . montelukast (SINGULAIR) 10 MG tablet    Sig: Take 1 tablet (10 mg total) by mouth at bedtime.    Dispense:  30 tablet    Refill:  3    Diagnostics: No laboratory results from [facility] Ashley Archer point to an underlying etiology.  ANA was positive, however this likely represents a false positive result that she has no other symptoms consistent with rheumatologic disease.    Physical examination: Blood pressure 102/72, pulse 66, temperature 98.4 F (36.9 C), temperature source Oral, resp. rate 16, height 5\' Archer"  (1.575 m), weight 161 lb (73 kg), last menstrual period 04/13/Ashley Archer, SpO2 96 %.  General: Alert, interactive, in no acute distress. HEENT: TMs pearly gray, turbinates mildly edematous without discharge, post-pharynx unremarkable. Neck: Supple without lymphadenopathy. Lungs: Clear to auscultation without wheezing, rhonchi or rales. CV: Normal S1, S2 without murmurs. Skin: Warm and dry, without lesions or rashes.  The following portions of the patient's history were reviewed and updated as appropriate: allergies, current medications, past family history, past medical history, past social history, past surgical  history and problem list.  Allergies as of  5/14/Ashley Archer      Reactions   Cefdinir Anaphylaxis, Shortness Of Breath   Coconut Oil Swelling, Rash   Hydrocodone Rash      Medication List        Accurate as of 02/11/18  3:03 PM. Always use your most recent med list.          ALIGN PO Take 1 capsule by mouth daily.   azelastine 0.1 % nasal spray Commonly known as:  ASTELIN Place 1 spray into both nostrils Archer (two) times daily. Use in each nostril as directed   diphenhydrAMINE 25 MG tablet Commonly known as:  BENADRYL Take 50 mg by mouth daily as needed for itching (HIVES).   EPINEPHrine 0.3 mg/0.3 mL Soaj injection Commonly known as:  EPIPEN Archer-PAK Inject 0.3 mLs (0.3 mg total) into the muscle once as needed for up to 1 dose (for severe allergic reaction). CAll 911 immediately if you have to use this medicine   famotidine 20 MG tablet Commonly known as:  PEPCID Take 1 tablet (20 mg total) by mouth Archer (two) times daily.   feeding supplement Liqd Take 1 Container by mouth daily.   fluticasone 50 MCG/ACT nasal spray Commonly known as:  FLONASE Place 1 spray into both nostrils Archer (two) times daily.   ibuprofen 600 MG tablet Commonly known as:  ADVIL,MOTRIN Take 1 tablet (600 mg total) by mouth every 6 (six) hours as needed.   levocetirizine 5 MG tablet Commonly known as:  XYZAL Take 1 tablet (5 mg total) by mouth every evening.   LUMIFY 0.025 % Soln Generic drug:  Brimonidine Tartrate Place 1 drop into both eyes daily.   montelukast 10 MG tablet Commonly known as:  SINGULAIR Take 1 tablet (10 mg total) by mouth at bedtime.   Olopatadine HCl 0.7 % Soln Commonly known as:  PAZEO Apply 1 drop to eye daily as needed.   ondansetron 8 MG tablet Commonly known as:  ZOFRAN as needed.   travoprost (benzalkonium) 0.004 % ophthalmic solution Commonly known as:  TRAVATAN Place 1 drop into both eyes at bedtime.   VITAMIN E PO Take 1 tablet by mouth daily.       Allergies  Allergen Reactions  . Cefdinir  Anaphylaxis and Shortness Of Breath  . Coconut Oil Swelling and Rash  . Hydrocodone Rash   Review of systems: Review of systems negative except as noted in HPI / PMHx or noted below: Constitutional: Negative.  HENT: Negative.   Eyes: Negative.  Respiratory: Negative.   Cardiovascular: Negative.  Gastrointestinal: Negative.  Genitourinary: Negative.  Musculoskeletal: Negative.  Neurological: Negative.  Endo/Heme/Allergies: Negative.  Cutaneous: Negative.  Past Medical History:  Diagnosis Date  . Angioedema 5/14/Ashley Archer  . CIN II (cervical intraepithelial neoplasia II)   . Fibroid   . GERD (gastroesophageal reflux disease)   . Urticaria     Family History  Problem Relation Age of Onset  . Hypertension Mother   . Cancer Mother 52       cervical  . Pancreatic cancer Father   . Cancer Father 101       pancreatic  . Diabetes Maternal Aunt   . Diabetes Maternal Grandmother     Social History   Socioeconomic History  . Marital status: Married    Spouse name: Not on file  . Number of children: Not on file  . Years of education: Not on file  . Highest education level: Not on  file  Occupational History  . Not on file  Social Needs  . Financial resource strain: Not on file  . Food insecurity:    Worry: Not on file    Inability: Not on file  . Transportation needs:    Medical: Not on file    Non-medical: Not on file  Tobacco Use  . Smoking status: Never Smoker  . Smokeless tobacco: Never Used  Substance and Sexual Activity  . Alcohol use: No  . Drug use: No  . Sexual activity: Yes    Birth control/protection: Condom  Lifestyle  . Physical activity:    Days per week: Not on file    Minutes per session: Not on file  . Stress: Not on file  Relationships  . Social connections:    Talks on phone: Not on file    Gets together: Not on file    Attends religious service: Not on file    Active member of club or organization: Not on file    Attends meetings of clubs or  organizations: Not on file    Relationship status: Not on file  . Intimate partner violence:    Fear of current or ex partner: Not on file    Emotionally abused: Not on file    Physically abused: Not on file    Forced sexual activity: Not on file  Other Topics Concern  . Not on file  Social History Narrative  . Not on file    I appreciate the opportunity to take part in Sameerah's care. Please do not hesitate to contact me with questions.  Sincerely,   R. Edgar Frisk, MD

## 2018-02-11 NOTE — Telephone Encounter (Signed)
Pt called and would like to talk with a nurse to see if she needs to come in she is having swelling an itching in her throat. No breathing problems at this time [phone].

## 2018-02-28 ENCOUNTER — Ambulatory Visit
Admission: RE | Admit: 2018-02-28 | Discharge: 2018-02-28 | Disposition: A | Discharge status: Home / Self Care | Payer: 59 | Source: Ambulatory Visit | Attending: Obstetrics and Gynecology | Admitting: Obstetrics and Gynecology

## 2018-02-28 DIAGNOSIS — Z1231 Encounter for screening mammogram for malignant neoplasm of breast: Secondary | ICD-10-CM

## 2018-04-23 DIAGNOSIS — N821 Other female urinary-genital tract fistulae: Secondary | ICD-10-CM | POA: Diagnosis not present

## 2018-04-23 DIAGNOSIS — D259 Leiomyoma of uterus, unspecified: Secondary | ICD-10-CM | POA: Diagnosis not present

## 2018-05-06 DIAGNOSIS — N924 Excessive bleeding in the premenopausal period: Secondary | ICD-10-CM | POA: Diagnosis not present

## 2018-05-06 DIAGNOSIS — D259 Leiomyoma of uterus, unspecified: Secondary | ICD-10-CM | POA: Diagnosis not present

## 2018-05-22 DIAGNOSIS — D251 Intramural leiomyoma of uterus: Secondary | ICD-10-CM | POA: Diagnosis not present

## 2018-05-22 DIAGNOSIS — N939 Abnormal uterine and vaginal bleeding, unspecified: Secondary | ICD-10-CM | POA: Diagnosis not present

## 2018-05-22 DIAGNOSIS — N920 Excessive and frequent menstruation with regular cycle: Secondary | ICD-10-CM | POA: Diagnosis not present

## 2018-06-19 DIAGNOSIS — Z23 Encounter for immunization: Secondary | ICD-10-CM | POA: Diagnosis not present

## 2018-06-25 DIAGNOSIS — Z131 Encounter for screening for diabetes mellitus: Secondary | ICD-10-CM | POA: Diagnosis not present

## 2018-06-25 DIAGNOSIS — E559 Vitamin D deficiency, unspecified: Secondary | ICD-10-CM | POA: Diagnosis not present

## 2018-06-25 DIAGNOSIS — Z13220 Encounter for screening for lipoid disorders: Secondary | ICD-10-CM | POA: Diagnosis not present

## 2018-06-25 DIAGNOSIS — R5383 Other fatigue: Secondary | ICD-10-CM | POA: Diagnosis not present

## 2018-06-25 DIAGNOSIS — E040 Nontoxic diffuse goiter: Secondary | ICD-10-CM | POA: Diagnosis not present

## 2018-06-25 DIAGNOSIS — R69 Illness, unspecified: Secondary | ICD-10-CM | POA: Diagnosis not present

## 2018-06-25 DIAGNOSIS — N951 Menopausal and female climacteric states: Secondary | ICD-10-CM | POA: Diagnosis not present

## 2018-07-07 DIAGNOSIS — Z1211 Encounter for screening for malignant neoplasm of colon: Secondary | ICD-10-CM | POA: Diagnosis not present

## 2018-07-07 DIAGNOSIS — Z800 Family history of malignant neoplasm of digestive organs: Secondary | ICD-10-CM | POA: Diagnosis not present

## 2018-07-17 DIAGNOSIS — H5213 Myopia, bilateral: Secondary | ICD-10-CM | POA: Diagnosis not present

## 2018-07-17 DIAGNOSIS — H04123 Dry eye syndrome of bilateral lacrimal glands: Secondary | ICD-10-CM | POA: Diagnosis not present

## 2018-07-17 DIAGNOSIS — H401134 Primary open-angle glaucoma, bilateral, indeterminate stage: Secondary | ICD-10-CM | POA: Diagnosis not present

## 2018-07-22 DIAGNOSIS — Z09 Encounter for follow-up examination after completed treatment for conditions other than malignant neoplasm: Secondary | ICD-10-CM | POA: Diagnosis not present

## 2018-07-30 DIAGNOSIS — H401131 Primary open-angle glaucoma, bilateral, mild stage: Secondary | ICD-10-CM | POA: Diagnosis not present

## 2018-08-04 DIAGNOSIS — E559 Vitamin D deficiency, unspecified: Secondary | ICD-10-CM | POA: Diagnosis not present

## 2018-08-04 DIAGNOSIS — N959 Unspecified menopausal and perimenopausal disorder: Secondary | ICD-10-CM | POA: Diagnosis not present

## 2018-08-04 DIAGNOSIS — E663 Overweight: Secondary | ICD-10-CM | POA: Diagnosis not present

## 2018-08-04 DIAGNOSIS — R5383 Other fatigue: Secondary | ICD-10-CM | POA: Diagnosis not present

## 2018-08-13 IMAGING — DX DG CHEST 2V
2 series · 2 of 2 positions shown · non-contrast
Comparison: Radiographs 07/25/2016

CLINICAL DATA: Chest pain.

EXAM:
CHEST - 2 VIEW

[chest pa]
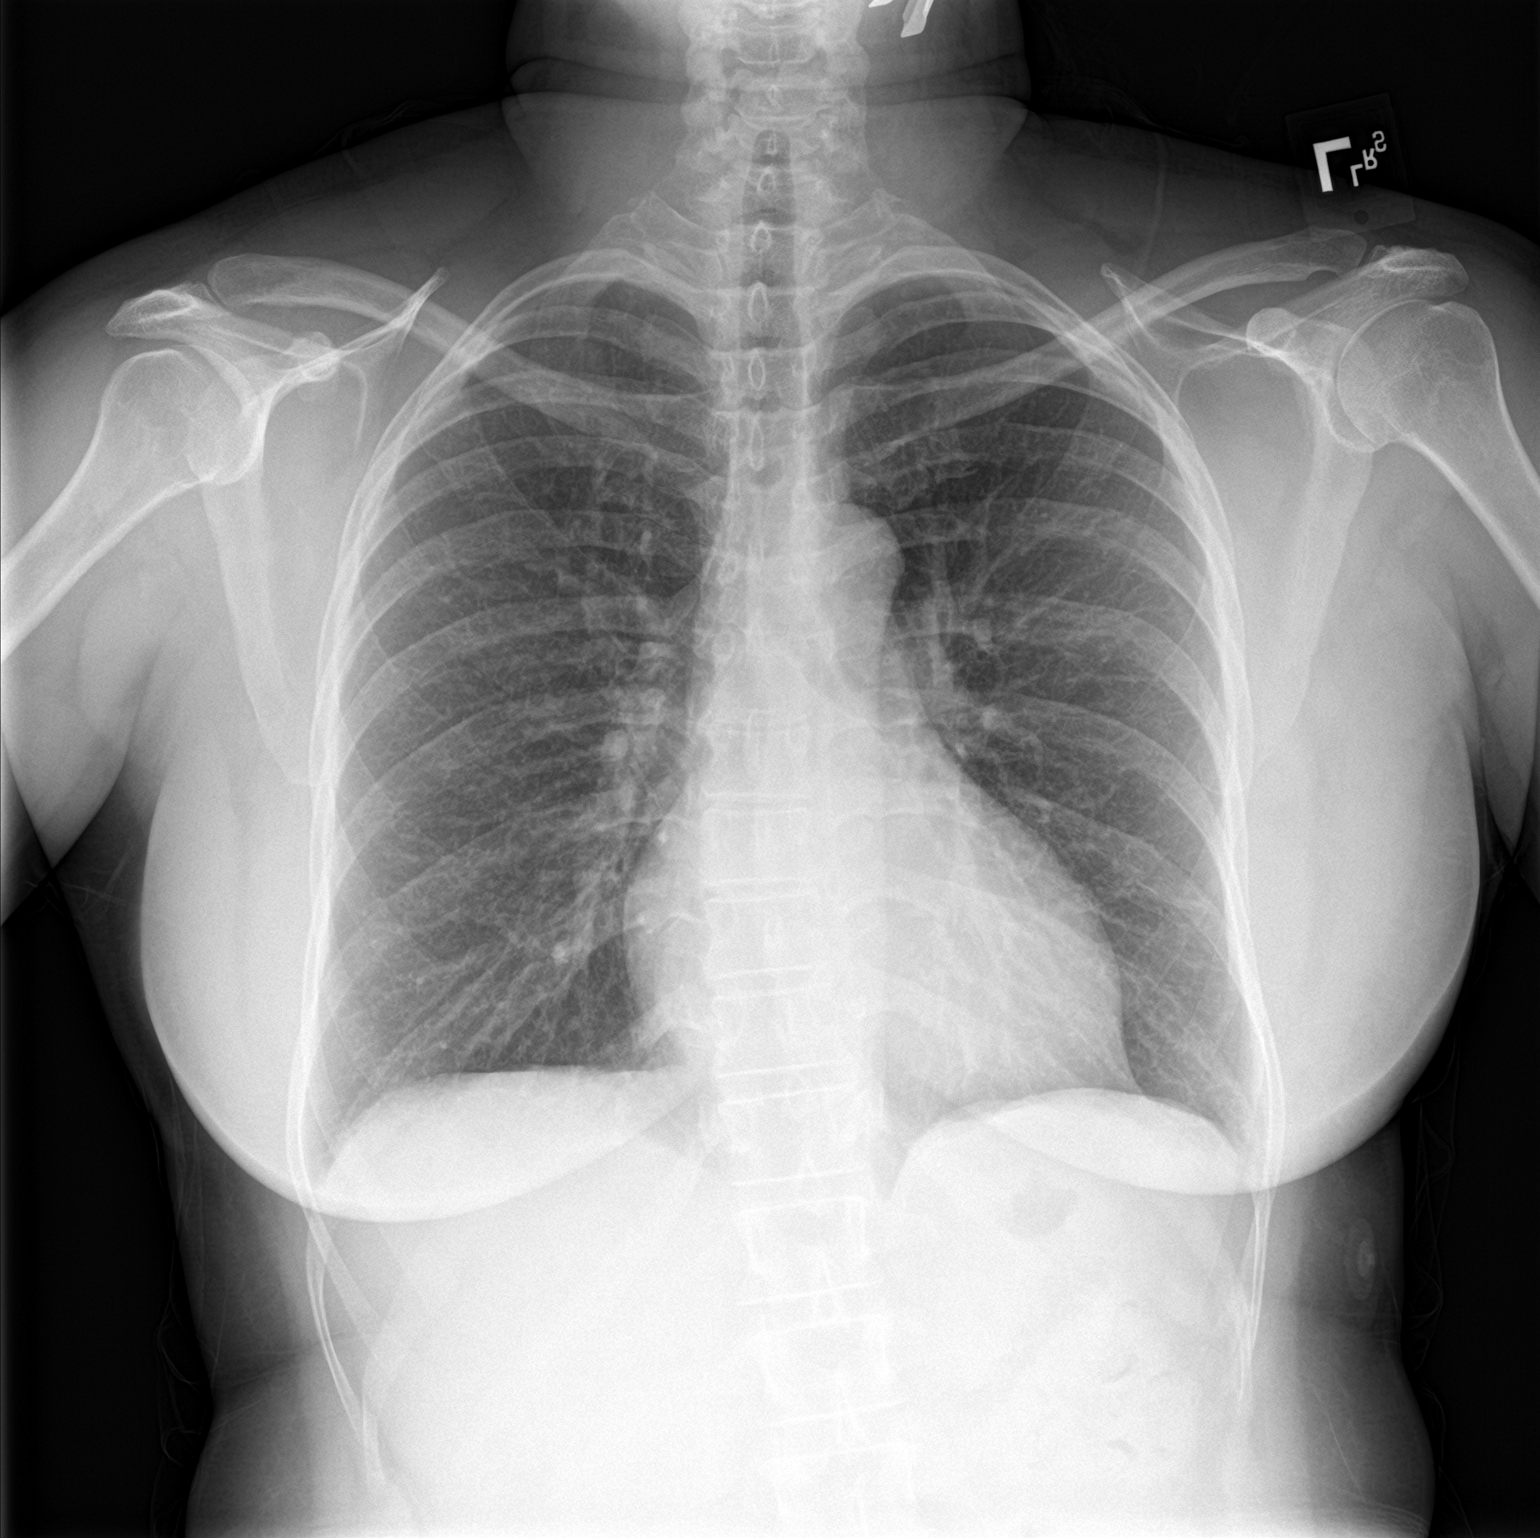

[chest lat]
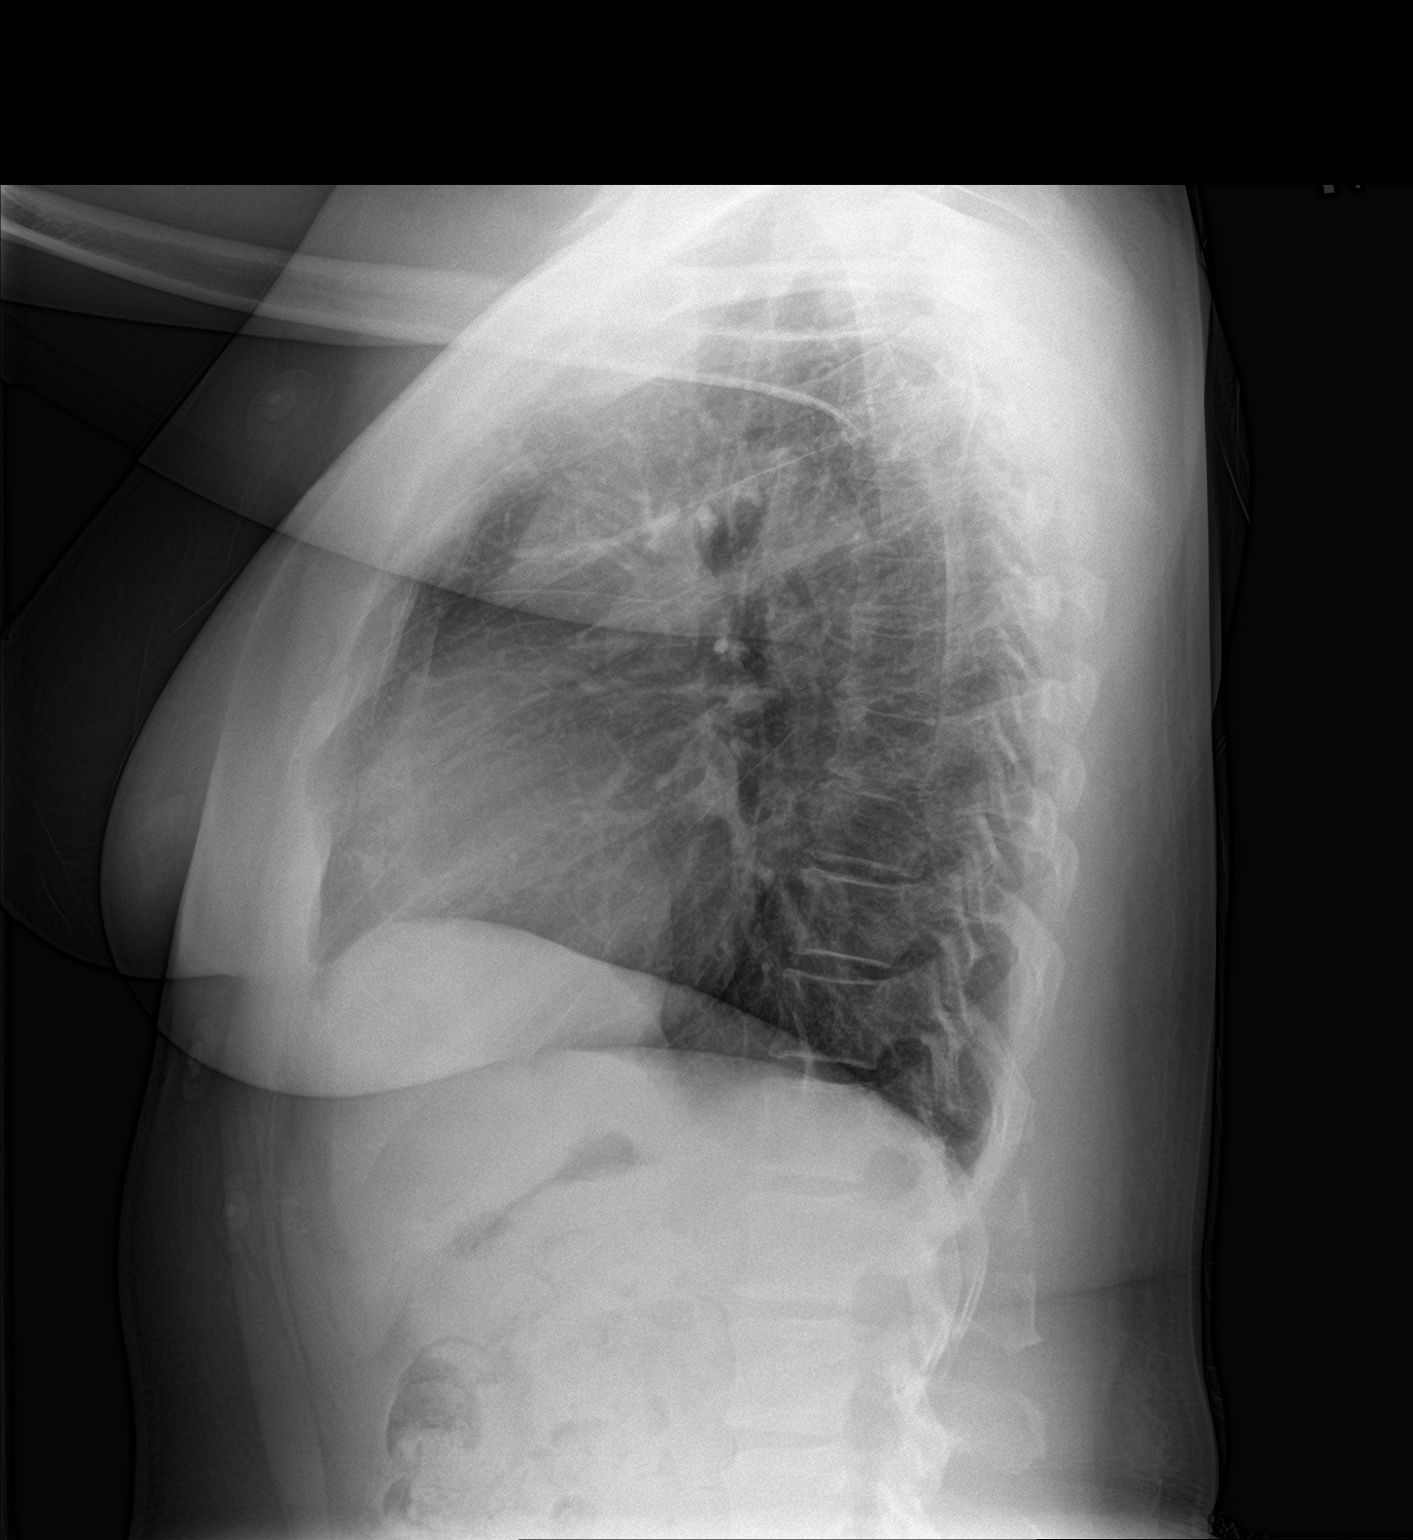

[2 of 2 positions shown; findings below may reference images not displayed]

FINDINGS: The cardiomediastinal contours are normal. The lungs are clear.
Pulmonary vasculature is normal. No consolidation, pleural effusion,
or pneumothorax. No acute osseous abnormalities are seen.
IMPRESSION: No acute pulmonary process.

## 2018-08-27 ENCOUNTER — Encounter (HOSPITAL_COMMUNITY): Payer: Self-pay

## 2018-08-27 ENCOUNTER — Emergency Department (HOSPITAL_COMMUNITY)
Admission: EM | Admit: 2018-08-27 | Discharge: 2018-08-27 | Disposition: A | Discharge status: Home / Self Care | Payer: 59 | Attending: Emergency Medicine | Admitting: Emergency Medicine

## 2018-08-27 ENCOUNTER — Emergency Department (HOSPITAL_COMMUNITY): Payer: 59

## 2018-08-27 DIAGNOSIS — Z79899 Other long term (current) drug therapy: Secondary | ICD-10-CM | POA: Insufficient documentation

## 2018-08-27 DIAGNOSIS — S8992XA Unspecified injury of left lower leg, initial encounter: Secondary | ICD-10-CM | POA: Diagnosis not present

## 2018-08-27 DIAGNOSIS — S199XXA Unspecified injury of neck, initial encounter: Secondary | ICD-10-CM | POA: Diagnosis not present

## 2018-08-27 DIAGNOSIS — R52 Pain, unspecified: Secondary | ICD-10-CM | POA: Diagnosis not present

## 2018-08-27 DIAGNOSIS — M549 Dorsalgia, unspecified: Secondary | ICD-10-CM

## 2018-08-27 DIAGNOSIS — M25562 Pain in left knee: Secondary | ICD-10-CM | POA: Insufficient documentation

## 2018-08-27 DIAGNOSIS — S3992XA Unspecified injury of lower back, initial encounter: Secondary | ICD-10-CM | POA: Diagnosis not present

## 2018-08-27 DIAGNOSIS — M791 Myalgia, unspecified site: Secondary | ICD-10-CM

## 2018-08-27 DIAGNOSIS — M545 Low back pain: Secondary | ICD-10-CM | POA: Insufficient documentation

## 2018-08-27 DIAGNOSIS — M25552 Pain in left hip: Secondary | ICD-10-CM

## 2018-08-27 DIAGNOSIS — M7918 Myalgia, other site: Secondary | ICD-10-CM | POA: Diagnosis not present

## 2018-08-27 DIAGNOSIS — S79912A Unspecified injury of left hip, initial encounter: Secondary | ICD-10-CM | POA: Diagnosis not present

## 2018-08-27 DIAGNOSIS — M5489 Other dorsalgia: Secondary | ICD-10-CM | POA: Diagnosis not present

## 2018-08-27 DIAGNOSIS — M546 Pain in thoracic spine: Secondary | ICD-10-CM | POA: Diagnosis not present

## 2018-08-27 DIAGNOSIS — M542 Cervicalgia: Secondary | ICD-10-CM | POA: Diagnosis not present

## 2018-08-27 DIAGNOSIS — S0990XA Unspecified injury of head, initial encounter: Secondary | ICD-10-CM | POA: Diagnosis not present

## 2018-08-27 DIAGNOSIS — S299XXA Unspecified injury of thorax, initial encounter: Secondary | ICD-10-CM | POA: Diagnosis not present

## 2018-08-27 DIAGNOSIS — R51 Headache: Secondary | ICD-10-CM | POA: Diagnosis present

## 2018-08-27 DIAGNOSIS — R42 Dizziness and giddiness: Secondary | ICD-10-CM | POA: Diagnosis not present

## 2018-08-27 MED ORDER — IBUPROFEN 800 MG PO TABS: 800.0000 mg | ORAL_TABLET | Freq: Three times a day (TID) | ORAL | 0 refills | Status: DC

## 2018-08-27 MED ORDER — METHOCARBAMOL 500 MG PO TABS
500.0000 mg | ORAL_TABLET | Freq: Once | ORAL | Status: AC
  Administered 2018-08-27: 500 mg via ORAL
  Filled 2018-08-27: qty 1

## 2018-08-27 MED ORDER — METHOCARBAMOL 500 MG PO TABS: 500.0000 mg | ORAL_TABLET | Freq: Two times a day (BID) | ORAL | 0 refills | Status: DC | PRN

## 2018-08-27 MED ORDER — ACETAMINOPHEN 500 MG PO TABS
1000.0000 mg | ORAL_TABLET | Freq: Once | ORAL | Status: AC
Start: 2018-08-27 — End: 2018-08-27
  Administered 2018-08-27: 1000 mg via ORAL
  Filled 2018-08-27: qty 2
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
## 2018-09-10 DIAGNOSIS — N951 Menopausal and female climacteric states: Secondary | ICD-10-CM | POA: Diagnosis not present

## 2018-09-10 DIAGNOSIS — R5383 Other fatigue: Secondary | ICD-10-CM | POA: Diagnosis not present

## 2018-09-10 DIAGNOSIS — E663 Overweight: Secondary | ICD-10-CM | POA: Diagnosis not present

## 2018-09-10 DIAGNOSIS — N959 Unspecified menopausal and perimenopausal disorder: Secondary | ICD-10-CM | POA: Diagnosis not present

## 2018-09-10 DIAGNOSIS — E559 Vitamin D deficiency, unspecified: Secondary | ICD-10-CM | POA: Diagnosis not present

## 2018-10-13 ENCOUNTER — Ambulatory Visit (INDEPENDENT_AMBULATORY_CARE_PROVIDER_SITE_OTHER): Payer: 59 | Admitting: Otolaryngology

## 2018-10-13 DIAGNOSIS — H93293 Other abnormal auditory perceptions, bilateral: Secondary | ICD-10-CM

## 2018-10-13 DIAGNOSIS — H6983 Other specified disorders of Eustachian tube, bilateral: Secondary | ICD-10-CM | POA: Diagnosis not present

## 2018-10-15 DIAGNOSIS — Z1211 Encounter for screening for malignant neoplasm of colon: Secondary | ICD-10-CM | POA: Diagnosis not present

## 2018-10-15 DIAGNOSIS — D120 Benign neoplasm of cecum: Secondary | ICD-10-CM | POA: Diagnosis not present

## 2018-10-15 DIAGNOSIS — Z800 Family history of malignant neoplasm of digestive organs: Secondary | ICD-10-CM | POA: Diagnosis not present

## 2018-10-15 DIAGNOSIS — K621 Rectal polyp: Secondary | ICD-10-CM | POA: Diagnosis not present

## 2018-10-17 DIAGNOSIS — K621 Rectal polyp: Secondary | ICD-10-CM | POA: Diagnosis not present

## 2018-10-17 DIAGNOSIS — D120 Benign neoplasm of cecum: Secondary | ICD-10-CM | POA: Diagnosis not present

## 2018-11-04 DIAGNOSIS — R5383 Other fatigue: Secondary | ICD-10-CM | POA: Diagnosis not present

## 2018-11-04 DIAGNOSIS — E559 Vitamin D deficiency, unspecified: Secondary | ICD-10-CM | POA: Diagnosis not present

## 2018-11-25 DIAGNOSIS — H401131 Primary open-angle glaucoma, bilateral, mild stage: Secondary | ICD-10-CM | POA: Diagnosis not present

## 2018-11-25 DIAGNOSIS — H04123 Dry eye syndrome of bilateral lacrimal glands: Secondary | ICD-10-CM | POA: Diagnosis not present

## 2019-01-01 DIAGNOSIS — R067 Sneezing: Secondary | ICD-10-CM | POA: Diagnosis not present

## 2019-01-01 DIAGNOSIS — R05 Cough: Secondary | ICD-10-CM | POA: Diagnosis not present

## 2019-01-08 DIAGNOSIS — H5213 Myopia, bilateral: Secondary | ICD-10-CM | POA: Diagnosis not present

## 2019-01-08 DIAGNOSIS — H401131 Primary open-angle glaucoma, bilateral, mild stage: Secondary | ICD-10-CM | POA: Diagnosis not present

## 2019-01-08 DIAGNOSIS — H40053 Ocular hypertension, bilateral: Secondary | ICD-10-CM | POA: Diagnosis not present

## 2019-01-08 DIAGNOSIS — H04123 Dry eye syndrome of bilateral lacrimal glands: Secondary | ICD-10-CM | POA: Diagnosis not present

## 2019-01-12 DIAGNOSIS — R05 Cough: Secondary | ICD-10-CM | POA: Diagnosis not present

## 2019-02-05 ENCOUNTER — Ambulatory Visit (INDEPENDENT_AMBULATORY_CARE_PROVIDER_SITE_OTHER): Payer: 59 | Admitting: Internal Medicine

## 2019-02-05 DIAGNOSIS — N959 Unspecified menopausal and perimenopausal disorder: Secondary | ICD-10-CM | POA: Diagnosis not present

## 2019-02-05 DIAGNOSIS — E559 Vitamin D deficiency, unspecified: Secondary | ICD-10-CM | POA: Diagnosis not present

## 2019-02-05 DIAGNOSIS — R05 Cough: Secondary | ICD-10-CM | POA: Diagnosis not present

## 2019-02-11 DIAGNOSIS — Z01419 Encounter for gynecological examination (general) (routine) without abnormal findings: Secondary | ICD-10-CM | POA: Diagnosis not present

## 2019-02-11 DIAGNOSIS — Z6830 Body mass index (BMI) 30.0-30.9, adult: Secondary | ICD-10-CM | POA: Diagnosis not present

## 2019-02-26 DIAGNOSIS — H04123 Dry eye syndrome of bilateral lacrimal glands: Secondary | ICD-10-CM | POA: Diagnosis not present

## 2019-02-26 DIAGNOSIS — H40053 Ocular hypertension, bilateral: Secondary | ICD-10-CM | POA: Diagnosis not present

## 2019-02-26 DIAGNOSIS — H401131 Primary open-angle glaucoma, bilateral, mild stage: Secondary | ICD-10-CM | POA: Diagnosis not present

## 2019-02-26 DIAGNOSIS — H5213 Myopia, bilateral: Secondary | ICD-10-CM | POA: Diagnosis not present
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
## 2019-05-05 ENCOUNTER — Other Ambulatory Visit: Payer: Self-pay | Admitting: Obstetrics and Gynecology

## 2019-05-05 DIAGNOSIS — Z1231 Encounter for screening mammogram for malignant neoplasm of breast: Secondary | ICD-10-CM

## 2019-05-08 ENCOUNTER — Other Ambulatory Visit: Payer: Self-pay

## 2019-05-08 ENCOUNTER — Ambulatory Visit
Admission: RE | Admit: 2019-05-08 | Discharge: 2019-05-08 | Disposition: A | Discharge status: Home / Self Care | Payer: 59 | Source: Ambulatory Visit | Attending: Obstetrics and Gynecology | Admitting: Obstetrics and Gynecology

## 2019-05-08 DIAGNOSIS — Z1231 Encounter for screening mammogram for malignant neoplasm of breast: Secondary | ICD-10-CM

## 2019-05-12 ENCOUNTER — Other Ambulatory Visit: Payer: Self-pay | Admitting: Obstetrics and Gynecology

## 2019-05-12 DIAGNOSIS — R928 Other abnormal and inconclusive findings on diagnostic imaging of breast: Secondary | ICD-10-CM

## 2019-05-14 ENCOUNTER — Other Ambulatory Visit: Payer: Self-pay

## 2019-05-14 ENCOUNTER — Ambulatory Visit
Admission: RE | Admit: 2019-05-14 | Discharge: 2019-05-14 | Disposition: A | Discharge status: Home / Self Care | Payer: 59 | Source: Ambulatory Visit | Attending: Obstetrics and Gynecology | Admitting: Obstetrics and Gynecology

## 2019-05-14 DIAGNOSIS — N6489 Other specified disorders of breast: Secondary | ICD-10-CM | POA: Diagnosis not present

## 2019-05-14 DIAGNOSIS — R928 Other abnormal and inconclusive findings on diagnostic imaging of breast: Secondary | ICD-10-CM | POA: Diagnosis not present

## 2019-06-30 DIAGNOSIS — N920 Excessive and frequent menstruation with regular cycle: Secondary | ICD-10-CM | POA: Diagnosis not present

## 2019-06-30 DIAGNOSIS — D259 Leiomyoma of uterus, unspecified: Secondary | ICD-10-CM | POA: Diagnosis not present

## 2019-07-06 DIAGNOSIS — D251 Intramural leiomyoma of uterus: Secondary | ICD-10-CM | POA: Diagnosis not present

## 2019-07-06 DIAGNOSIS — D252 Subserosal leiomyoma of uterus: Secondary | ICD-10-CM | POA: Diagnosis not present

## 2019-07-06 DIAGNOSIS — N920 Excessive and frequent menstruation with regular cycle: Secondary | ICD-10-CM | POA: Diagnosis not present

## 2019-07-15 ENCOUNTER — Other Ambulatory Visit: Payer: Self-pay

## 2019-07-15 ENCOUNTER — Ambulatory Visit (INDEPENDENT_AMBULATORY_CARE_PROVIDER_SITE_OTHER): Payer: 59 | Admitting: Internal Medicine

## 2019-07-15 ENCOUNTER — Encounter (INDEPENDENT_AMBULATORY_CARE_PROVIDER_SITE_OTHER): Payer: Self-pay | Admitting: Internal Medicine

## 2019-07-15 VITALS — BP 122/64 | HR 70 | Temp 98.4°F | Resp 18 | Ht 62.0 in | Wt 167.0 lb

## 2019-07-15 DIAGNOSIS — Z131 Encounter for screening for diabetes mellitus: Secondary | ICD-10-CM

## 2019-07-15 DIAGNOSIS — E559 Vitamin D deficiency, unspecified: Secondary | ICD-10-CM | POA: Diagnosis not present

## 2019-07-15 DIAGNOSIS — J3089 Other allergic rhinitis: Secondary | ICD-10-CM | POA: Diagnosis not present

## 2019-07-15 DIAGNOSIS — Z13220 Encounter for screening for lipoid disorders: Secondary | ICD-10-CM

## 2019-07-15 DIAGNOSIS — D219 Benign neoplasm of connective and other soft tissue, unspecified: Secondary | ICD-10-CM

## 2019-07-15 DIAGNOSIS — Z23 Encounter for immunization: Secondary | ICD-10-CM

## 2019-07-15 DIAGNOSIS — Z0001 Encounter for general adult medical examination with abnormal findings: Secondary | ICD-10-CM | POA: Diagnosis not present

## 2019-07-15 DIAGNOSIS — N951 Menopausal and female climacteric states: Secondary | ICD-10-CM | POA: Insufficient documentation

## 2019-07-15 DIAGNOSIS — N920 Excessive and frequent menstruation with regular cycle: Secondary | ICD-10-CM

## 2019-07-15 NOTE — Patient Instructions (Signed)
[employer] Dietary Recommendations for Weight Loss What to Avoid . Avoid added sugars o Often added sugar can be found in processed foods such as many condiments, dry cereals, cakes, cookies, chips, crisps, crackers, candies, sweetened drinks, etc.  o Read labels and AVOID/DECREASE use of foods with the following in their ingredient list: Sugar, fructose, high fructose corn syrup, sucrose, glucose, maltose, dextrose, molasses, cane sugar, brown sugar, any type of syrup, agave nectar, etc.   . Avoid snacking in between meals . Avoid foods made with flour o If you are going to eat food made with flour, choose those made with whole-grains; and, minimize your consumption as much as is tolerable . Avoid processed foods o These foods are generally stocked in the middle of the grocery store. Focus on shopping on the perimeter of the grocery.  What to Include . Vegetables o GREEN LEAFY VEGETABLES: Kale, spinach, mustard greens, collard greens, cabbage, broccoli, etc. o OTHER: Asparagus, cauliflower, eggplant, carrots, peas, Brussel sprouts, tomatoes, bell peppers, zucchini, beets, cucumbers, etc. . Grains, seeds, and legumes o Beans: kidney beans, black eyed peas, garbanzo beans, black beans, pinto beans, etc. o Whole, unrefined grains: brown rice, barley, bulgur, oatmeal, etc. . Healthy fats  o Avoid highly processed fats such as vegetable oil o Examples of healthy fats: avocado, olives, virgin olive oil, dark chocolate (?72% Cocoa), nuts (peanuts, almonds, walnuts, cashews, pecans, etc.) . Low - Moderate Intake of Animal Sources of Protein o Meat sources: chicken, [place], salmon, tuna. Limit to 4 ounces of meat at one time. o Consider limiting dairy sources, but when choosing dairy focus on: PLAIN [place] yogurt, cottage cheese, high-protein milk . Fruit o Choose berries  When to Eat . Intermittent Fasting: o Choosing not to eat for a specific time  period, but DO FOCUS ON HYDRATION when fasting o Multiple Techniques: - Time Restricted Eating: eat 3 meals in a day, each meal lasting no more than 60 minutes, no snacks between meals - 16-18 hour fast: fast for 16 to 18 hours up to 7 days a week. Often suggested to start with 2-3 nonconsecutive days per week.  . Remember the time you sleep is counted as fasting.  . Examples of eating schedule: Fast from 7:00pm-11:00am. Eat between 11:00am-7:00pm.  - 24-hour fast: fast for 24 hours up to every other day. Often suggested to start with 1 day per week . Remember the time you sleep is counted as fasting . Examples of eating schedule:  o Eating day: eat 2-3 meals on your eating day. If doing 2 meals, each meal should last no more than 90 minutes. If doing 3 meals, each meal should last no more than 60 minutes. Finish last meal by 7:00pm. o Fasting day: Fast until 7:00pm.  o IF YOU FEEL UNWELL FOR ANY REASON/IN ANY WAY WHEN FASTING, STOP FASTING BY EATING A NUTRITIOUS SNACK OR LIGHT MEAL o ALWAYS FOCUS ON HYDRATION DURING FASTS - Acceptable Hydration sources: water, broths, tea/coffee (black tea/coffee is best but using a small amount of whole-fat dairy products in coffee/tea is acceptable).  - Poor Hydration Sources: anything with sugar or artificial sweeteners added to it  These recommendations have been developed for patients that are actively receiving medical care from either Dr. Anastasio Champion or Jeralyn Ruths, DNP, NP-C at [facility]. These recommendations are developed for patients with specific medical conditions and are not meant to be distributed or used by others that are not  actively receiving care from either provider listed above at [facility]. It is not appropriate to participate in the above eating plans without proper medical supervision.   Reference: Rexanne Mano. The obesity code. [place]; 2016.

## 2019-07-15 NOTE — Progress Notes (Signed)
Chief Complaint: This 48 year old lady comes in for an annual physical exam. HPI: Her main issue has been 1 of menorrhagia which she has been dealing with for several months now.  She said that on the last month, she had bleeding for 18 days straight.  She has seen her gynecologist and has been given options including uterine ablation and even hysterectomy and she is trying to think about this. I had seen her previously for perimenopausal symptoms of hot flashes and progesterone has completely resolved the symptom and she is thankful for this. She also has vitamin D deficiency and takes vitamin D3 supplementation.  She seems to be doing well with this.  Past Medical History:  Diagnosis Date  . Allergy   . Angioedema 02/11/2018  . CIN II (cervical intraepithelial neoplasia II)   . Fibroid   . GERD (gastroesophageal reflux disease)   . Perimenopause   . Urticaria   . Vitamin D deficiency disease    Past Surgical History:  Procedure Laterality Date  . CERVICAL BIOPSY  W/ LOOP ELECTRODE EXCISION  2001   CIN ll  . DILATION AND CURETTAGE OF UTERUS    . 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        Allergies:  Allergies  Allergen Reactions  . Cefdinir Anaphylaxis and Shortness Of Breath  . Coconut Oil Swelling and Rash  . Hydrocodone Rash     Current Meds  Medication Sig  . Biotin 1 MG CAPS Take 1 capsule by mouth daily.  . Cholecalciferol (VITAMIN D-3) 125 MCG (5000 UT) TABS Take 2 tablets by mouth daily.  Marland Kitchen EPINEPHrine (EPIPEN 2-PAK) 0.3 mg/0.3 mL IJ SOAJ injection Inject 0.3 mLs (0.3 mg total) into the muscle once as needed for up to 1 dose (for severe allergic reaction). CAll 911 immediately if you have to use this medicine  . fluticasone (FLONASE) 50 MCG/ACT nasal spray Place 1 spray into both nostrils 2 (two) times daily.  Marland Kitchen loratadine (CLARITIN) 10  MG tablet Take 10 mg by mouth daily as needed for allergies.  . progesterone (PROMETRIUM) 200 MG capsule Take 200 mg by mouth every evening.  . Tafluprost, PF, (ZIOPTAN) 0.0015 % SOLN Apply 1 drop to eye daily. Each eye  . vitamin E 400 UNIT capsule Take 400 Units by mouth daily.  . [DISCONTINUED] azelastine (ASTELIN) 0.1 % nasal spray Place 1 spray into both nostrils 2 (two) times daily. Use in each nostril as directed     Nutrition She continues with intermittent fasting almost on a daily basis and tries to eat healthy.  Sleep Adequate sleep every night, uninterrupted.  Exercise She uses a stationary bicycle and also does strength training at home on a regular basis.  Bio-identical hormones Micronized progesterone is being used in this patient for multiple benefits based on studies including protection against uterine cancer, breast cancer, osteoporosis and heart disease. The patient has been counseled regarding side effects, benefits and modes of administration. The patient is agreeable that this therapy is an integral part of her wellness, quality of life and prevention of chronic disease.  [id number] from the symptoms mentioned above,there are no other symptoms referable to all systems reviewed.  Physical Exam: Blood pressure 122/64, pulse 70, temperature 98.4 F (36.9 C), temperature source Temporal, resp. rate 18, height 5\' 2"  (1.575 m), weight 167 lb (75.8 kg), SpO2 91 %. Vitals with  BMI 07/15/2019 08/27/2018 02/11/2018  Height 5\' 2"  5\' 2"  5\' 2"   Weight 167 lbs 162 lbs 161 lbs  BMI 30.54 [id number]  Systolic [id number]  Diastolic 64 97 72  Pulse 70 62 66      She looks systemically well.  She remains obese. General: Alert, cooperative, and appears to be the stated age.No pallor.  No jaundice.  No clubbing. Head: Normocephalic Eyes: Sclera white, pupils equal and reactive to light, red reflex x 2,  Ears: Normal bilaterally Oral cavity: Lips, mucosa, and tongue normal:  Teeth and gums normal Neck: No adenopathy, supple, symmetrical, trachea midline, and thyroid does not appear enlarged Respiratory: Clear to auscultation bilaterally.No wheezing, crackles or bronchial breathing. Cardiovascular: Heart sounds are present and appear to be normal without murmurs or added sounds.  No carotid bruits.  Peripheral pulses are present and equal bilaterally.: Gastrointestinal:positive bowel sounds, no hepatosplenomegaly.  No masses felt.No tenderness. Skin: Clear, No rashes noted.No worrisome skin lesions seen. Neurological: Grossly intact without focal findings, cranial nerves II through XII intact, muscle strength equal bilaterally Musculoskeletal: No acute joint abnormalities noted.Full range of movement noted with joints. Psychiatric: Affect appropriate, non-anxious.    Assessment  1. Seasonal allergic rhinitis   2. Vitamin D deficiency disease   3. Encounter for general adult medical examination with abnormal findings   4. Fibroid   5. Menorrhagia with regular cycle   6. Menopausal syndrome (hot flashes)   7. Screening for diabetes mellitus   8. Screening for lipoid disorders   9. Need for immunization against influenza   10. Perimenopause     Tests Ordered:   Orders Placed This Encounter  Procedures  . Flu Vaccine QUAD 36+ mos IM  . CBC  . COMPLETE METABOLIC PANEL WITH GFR  . Hemoglobin A1c  . Lipid panel  . T3, free  . TSH  . VITAMIN D 25 Hydroxy (Vit-D Deficiency, Fractures)     Plan  1. She will continue with vitamin D3 supplementation for vitamin D deficiency. 2. She continues with progesterone for her perimenopausal symptoms which have been helped by this. 3. Otherwise, she is reasonably healthy and she will have to decide regarding her menorrhagia from fibroids. 4. Blood work is ordered as above. 5. She was given influenza vaccination today. 6. Further recommendations will depend on blood results and I will see her in about 4 months  time for follow-up.  We did discuss again the importance of nutrition and helping her lose further weight and body fat in particular. 7. Today, in addition to a preventative visit, I performed an office visit to address her symptoms and conditions above.     No orders of the defined types were placed in this encounter.    Nimish C Gosrani   07/15/2019, 3:35 PM

## 2019-07-16 LAB — CBC
HCT: 42.8 % (ref 35.0–45.0)
Hemoglobin: 14.3 g/dL (ref 11.7–15.5)
MCH: 30.1 pg (ref 27.0–33.0)
MCHC: 33.4 g/dL (ref 32.0–36.0)
MCV: 90.1 fL (ref 80.0–100.0)
MPV: 11 fL (ref 7.5–12.5)
Platelets: 206 10*3/uL (ref 140–400)
RBC: 4.75 10*6/uL (ref 3.80–5.10)
RDW: 11.8 % (ref 11.0–15.0)
WBC: 5.8 10*3/uL (ref 3.8–10.8)

## 2019-07-16 LAB — HEMOGLOBIN A1C
Hgb A1c MFr Bld: 5.3 % of total Hgb (ref <5.7)
Mean Plasma Glucose: 105 (calc)
eAG (mmol/L): 5.8 (calc)

## 2019-07-16 LAB — COMPLETE METABOLIC PANEL WITH GFR
AG Ratio: 1.7 (calc) (ref 1.0–2.5)
ALT: 14 U/L (ref 6–29)
AST: 19 U/L (ref 10–35)
Albumin: 4.6 g/dL (ref 3.6–5.1)
Alkaline phosphatase (APISO): 45 U/L (ref 31–125)
BUN: 21 mg/dL (ref 7–25)
CO2: 29 mmol/L (ref 20–32)
Calcium: 9.7 mg/dL (ref 8.6–10.2)
Chloride: 103 mmol/L (ref 98–110)
Creat: 0.93 mg/dL (ref 0.50–1.10)
GFR, Est African American: 85 mL/min/{1.73_m2} (ref >=60)
GFR, Est Non African American: 73 mL/min/{1.73_m2} (ref >=60)
Globulin: 2.7 g/dL (calc) (ref 1.9–3.7)
Glucose, Bld: 74 mg/dL (ref 65–99)
Potassium: 4.1 mmol/L (ref 3.5–5.3)
Sodium: 138 mmol/L (ref 135–146)
Total Bilirubin: 1 mg/dL (ref 0.2–1.2)
Total Protein: 7.3 g/dL (ref 6.1–8.1)

## 2019-07-16 LAB — LIPID PANEL
Cholesterol: 197 mg/dL (ref <200)
HDL: 71 mg/dL (ref >=50)
LDL Cholesterol (Calc): 105 mg/dL (calc) — ABNORMAL HIGH
Non-HDL Cholesterol (Calc): 126 mg/dL (calc) (ref <130)
Total CHOL/HDL Ratio: 2.8 (calc) (ref <5.0)
Triglycerides: 116 mg/dL (ref <150)

## 2019-07-16 LAB — TSH: TSH: 0.7 mIU/L

## 2019-07-16 LAB — VITAMIN D 25 HYDROXY (VIT D DEFICIENCY, FRACTURES): Vit D, 25-Hydroxy: 90 ng/mL (ref 30–100)

## 2019-07-16 LAB — T3, FREE: T3, Free: 3.6 pg/mL (ref 2.3–4.2)

## 2019-08-26 ENCOUNTER — Other Ambulatory Visit: Payer: Self-pay

## 2019-08-26 ENCOUNTER — Emergency Department (HOSPITAL_COMMUNITY): Payer: 59

## 2019-08-26 ENCOUNTER — Emergency Department (HOSPITAL_COMMUNITY)
Admission: EM | Admit: 2019-08-26 | Discharge: 2019-08-26 | Disposition: A | Discharge status: Home / Self Care | Payer: 59 | Attending: Emergency Medicine | Admitting: Emergency Medicine

## 2019-08-26 ENCOUNTER — Encounter (INDEPENDENT_AMBULATORY_CARE_PROVIDER_SITE_OTHER): Payer: Self-pay | Admitting: Internal Medicine

## 2019-08-26 ENCOUNTER — Encounter (HOSPITAL_COMMUNITY): Payer: Self-pay

## 2019-08-26 DIAGNOSIS — Z79899 Other long term (current) drug therapy: Secondary | ICD-10-CM | POA: Diagnosis not present

## 2019-08-26 DIAGNOSIS — R079 Chest pain, unspecified: Secondary | ICD-10-CM | POA: Diagnosis not present

## 2019-08-26 DIAGNOSIS — R05 Cough: Secondary | ICD-10-CM | POA: Diagnosis not present

## 2019-08-26 DIAGNOSIS — Z20828 Contact with and (suspected) exposure to other viral communicable diseases: Secondary | ICD-10-CM | POA: Insufficient documentation

## 2019-08-26 DIAGNOSIS — B9789 Other viral agents as the cause of diseases classified elsewhere: Secondary | ICD-10-CM | POA: Diagnosis not present

## 2019-08-26 DIAGNOSIS — R0602 Shortness of breath: Secondary | ICD-10-CM | POA: Diagnosis not present

## 2019-08-26 DIAGNOSIS — J069 Acute upper respiratory infection, unspecified: Secondary | ICD-10-CM | POA: Diagnosis not present

## 2019-08-26 MED ORDER — ACETAMINOPHEN 325 MG PO TABS
650.0000 mg | ORAL_TABLET | Freq: Once | ORAL | Status: AC
  Administered 2019-08-26: 650 mg via ORAL
  Filled 2019-08-26: qty 2

## 2019-08-26 MED ORDER — IBUPROFEN 200 MG PO TABS
600.0000 mg | ORAL_TABLET | Freq: Once | ORAL | Status: AC
  Administered 2019-08-26: 600 mg via ORAL
  Filled 2019-08-26: qty 3

## 2019-08-26 NOTE — ED Triage Notes (Signed)
Pt coming from home c/o pain in chest when coughing, slight shortness of breath, and severe headache that started Friday.

## 2019-08-26 NOTE — Discharge Instructions (Addendum)
Chest x-ray overall is unremarkable.  Continue self-isolation at home until you hear back about coronavirus test.  Take Tylenol, Motrin for fever, body aches, headaches.  Stay well-hydrated.  Return to the ED if respiratory symptoms worsen.

## 2019-08-26 NOTE — ED Provider Notes (Signed)
[facility] DEPT Provider Note   CSN: [id number] Arrival date & time: 08/26/19  [id number]     History   Chief Complaint Chief Complaint  Patient presents with  . Cough  . Headache    HPI Trenae B Mckercher is a 48 y.o. female.     The history is provided by the patient.  URI Presenting symptoms: congestion, cough and fever   Presenting symptoms: no ear pain and no sore throat   Severity:  Mild Onset quality:  Gradual Duration:  4 days Timing:  Intermittent Progression:  Waxing and waning Chronicity:  New Relieved by:  Nothing Worsened by:  Nothing Associated symptoms: arthralgias, headaches, myalgias and sinus pain   Risk factors: no immunosuppression and no sick contacts     Past Medical History:  Diagnosis Date  . Allergy   . Angioedema 02/11/2018  . CIN II (cervical intraepithelial neoplasia II)   . Fibroid   . GERD (gastroesophageal reflux disease)   . Perimenopause   . Urticaria   . Vitamin D deficiency disease     Patient Active Problem List   Diagnosis Date Noted  . Vitamin D deficiency disease   . Perimenopause   . Angioedema 02/11/2018  . Allergic reaction 12/31/2017  . Seasonal allergic rhinitis 12/31/2017  . Allergic conjunctivitis 12/31/2017  . Idiopathic urticaria 12/31/2017  . CIN II (cervical intraepithelial neoplasia II)   . Fibroid     Past Surgical History:  Procedure Laterality Date  . CERVICAL BIOPSY  W/ LOOP ELECTRODE EXCISION  2001   CIN ll  . DILATION AND CURETTAGE OF UTERUS    . Thumb Surg       OB History    Gravida  3   Para  2   Term  2   Preterm      AB  1   Living  2     SAB  1   TAB      Ectopic      Multiple      Live Births               Home Medications    Prior to Admission medications   Medication Sig Start Date End Date Taking? Authorizing Provider  acetaminophen (TYLENOL) 325 MG tablet Take 325 mg by mouth every 6 (six) hours as needed for mild pain or  headache.   Yes [provider]  Cholecalciferol (VITAMIN D-3) 125 MCG (5000 UT) TABS Take 10,000 Units by mouth daily.    Yes [provider]  fluticasone (FLONASE) 50 MCG/ACT nasal spray Place 1 spray into both nostrils 2 (two) times daily. 01/02/18  Yes Bobbitt, Sedalia Muta, MD  guaiFENesin (ROBITUSSIN) 100 MG/5ML liquid Take 200 mg by mouth 3 (three) times daily as needed for cough.   Yes [provider]  loratadine (CLARITIN) 10 MG tablet Take 10 mg by mouth daily as needed for allergies.   Yes [provider]  Phenylephrine-DM-GG-APAP (DAYQUIL SEVERE + VAPOCOOL) 5-10-200-325 MG/15ML LIQD Take 15 mLs by mouth as needed (congestion).   Yes [provider]  progesterone (PROMETRIUM) 200 MG capsule Take 200 mg by mouth every evening.   Yes [provider]  Tafluprost, PF, (ZIOPTAN) 0.0015 % SOLN Place 1 drop into both eyes daily. Each eye    Yes [provider]  vitamin E 400 UNIT capsule Take 400 Units by mouth daily.   Yes [provider]  EPINEPHrine (EPIPEN 2-PAK) 0.3 mg/0.3 mL IJ SOAJ  injection Inject 0.3 mLs (0.3 mg total) into the muscle once as needed for up to 1 dose (for severe allergic reaction). CAll 911 immediately if you have to use this medicine Patient not taking: Reported on 08/26/2019 12/13/17   Pisciotta, Elmyra Ricks, PA-C    Family History Family History  Problem Relation Age of Onset  . Hypertension Mother   . Cancer Mother 77       cervical  . Pancreatic cancer Father   . Cancer Father 51       pancreatic  . Diabetes Maternal Aunt   . Diabetes Maternal Grandmother   . Breast cancer Paternal Grandmother        under 19    Social History Social History   Tobacco Use  . Smoking status: Never Smoker  . Smokeless tobacco: Never Used  Substance Use Topics  . Alcohol use: No  . Drug use: No     Allergies   Cefdinir, Coconut oil, and Hydrocodone   Review of Systems Review of Systems   Constitutional: Positive for fever. Negative for chills.  HENT: Positive for congestion and sinus pain. Negative for ear pain and sore throat.   Eyes: Negative for pain and visual disturbance.  Respiratory: Positive for cough. Negative for shortness of breath.   Cardiovascular: Negative for chest pain and palpitations.  Gastrointestinal: Negative for abdominal pain and vomiting.  Genitourinary: Negative for dysuria and hematuria.  Musculoskeletal: Positive for arthralgias and myalgias. Negative for back pain.  Skin: Negative for color change and rash.  Neurological: Positive for headaches. Negative for seizures and syncope.  All other systems reviewed and are negative.    Physical Exam Updated Vital Signs BP 101/76   Pulse 72   Temp 98.7 F (37.1 C) (Oral)   Resp 18   SpO2 96%   Physical Exam Vitals signs and nursing note reviewed.  Constitutional:      General: She is not in acute distress.    Appearance: She is well-developed. She is not ill-appearing.  HENT:     Head: Normocephalic and atraumatic.  Eyes:     Extraocular Movements: Extraocular movements intact.     Conjunctiva/sclera: Conjunctivae normal.  Neck:     Musculoskeletal: Normal range of motion and neck supple.  Cardiovascular:     Rate and Rhythm: Normal rate and regular rhythm.     Heart sounds: Normal heart sounds. No murmur.  Pulmonary:     Effort: Pulmonary effort is normal. No respiratory distress.     Breath sounds: Normal breath sounds.  Abdominal:     Palpations: Abdomen is soft.     Tenderness: There is no abdominal tenderness.  Musculoskeletal: Normal range of motion.  Skin:    General: Skin is warm and dry.     Capillary Refill: Capillary refill takes less than 2 seconds.  Neurological:     Mental Status: She is alert.  Psychiatric:        Mood and Affect: Mood normal.      ED Treatments / Results  Labs (all labs ordered are listed, but only abnormal results are displayed) Labs  Reviewed  NOVEL CORONAVIRUS, NAA (HOSP ORDER, SEND-OUT TO REF LAB; TAT 18-24 HRS)    EKG None  Radiology Dg Chest Portable 1 View  Result Date: 08/26/2019 CLINICAL DATA:  Cough, shortness of breath.  Mid chest pain. EXAM: PORTABLE CHEST 1 VIEW COMPARISON:  12/15/2017 FINDINGS: The heart size and mediastinal contours are within normal limits. Both lungs are clear. The visualized  skeletal structures are unremarkable. IMPRESSION: No acute cardiopulmonary disease. Electronically Signed   By: Zetta Bills M.D.   On: 08/26/2019 08:33    Procedures Procedures (including critical care time)  Medications Ordered in ED Medications  acetaminophen (TYLENOL) tablet 650 mg (650 mg Oral Given 08/26/19 0821)  ibuprofen (ADVIL) tablet 600 mg (600 mg Oral Given 08/26/19 [id number])     Initial Impression / Assessment and Plan / ED Course  I have reviewed the triage vital signs and the nursing notes.  Pertinent labs & imaging results that were available during my care of the patient were reviewed by me and considered in my medical decision making (see chart for details).     Cyanne B Schermerhorn is a 48 year old female with no significant medical history who presents to the ED with viral symptoms.  Patient with normal vitals.  No fever.  Has had cough, body aches, fever on and off for the last several days.  Has had mostly dry cough.  No history of coronavirus or exposure.  Chest x-ray showed no signs of infection.  Normal work of breathing.  Normal oxygenation and vitals.  Suspect coronavirus or other viral process.  Given Tylenol Motrin.  Recommend hydration.  Given return precautions.  Understands self-isolation and discharged in ED in good condition.  This chart was dictated using voice recognition software.  Despite best efforts to proofread,  errors can occur which can change the documentation meaning.    Jessye B Dykman was evaluated in Emergency Department on 08/26/2019 for the symptoms described in  the history of present illness. She was evaluated in the context of the global COVID-19 pandemic, which necessitated consideration that the patient might be at risk for infection with the SARS-CoV-2 virus that causes COVID-19. Institutional protocols and algorithms that pertain to the evaluation of patients at risk for COVID-19 are in a state of rapid change based on information released by regulatory bodies including the CDC and federal and state organizations. These policies and algorithms were followed during the patient's care in the ED.   Final Clinical Impressions(s) / ED Diagnoses   Final diagnoses:  Viral URI with cough    ED Discharge Orders    None       Lennice Sites, DO 08/26/19 [phone]

## 2019-08-26 NOTE — ED Notes (Signed)
X-ray at bedside

## 2019-08-27 LAB — NOVEL CORONAVIRUS, NAA (HOSP ORDER, SEND-OUT TO REF LAB; TAT 18-24 HRS): SARS-CoV-2, NAA: NOT DETECTED

## 2019-08-28 ENCOUNTER — Encounter (INDEPENDENT_AMBULATORY_CARE_PROVIDER_SITE_OTHER): Payer: Self-pay | Admitting: Internal Medicine

## 2019-08-31 ENCOUNTER — Telehealth (INDEPENDENT_AMBULATORY_CARE_PROVIDER_SITE_OTHER): Payer: 59 | Admitting: Internal Medicine

## 2019-08-31 ENCOUNTER — Encounter (INDEPENDENT_AMBULATORY_CARE_PROVIDER_SITE_OTHER): Payer: Self-pay | Admitting: Internal Medicine

## 2019-08-31 VITALS — HR 68

## 2019-08-31 DIAGNOSIS — R062 Wheezing: Secondary | ICD-10-CM | POA: Diagnosis not present

## 2019-08-31 DIAGNOSIS — J40 Bronchitis, not specified as acute or chronic: Secondary | ICD-10-CM

## 2019-08-31 MED ORDER — PREDNISONE 20 MG PO TABS: 40.0000 mg | ORAL_TABLET | Freq: Every day | ORAL | 1 refills | Status: DC

## 2019-08-31 MED ORDER — ALBUTEROL SULFATE HFA 108 (90 BASE) MCG/ACT IN AERS: 2.0000 | INHALATION_SPRAY | Freq: Four times a day (QID) | RESPIRATORY_TRACT | 1 refills | Status: DC | PRN

## 2019-08-31 NOTE — Progress Notes (Signed)
Metrics: Intervention Frequency ACO  Documented Smoking Status Yearly  Screened one or more times in 24 months  Cessation Counseling or  Active cessation medication Past 24 months  Past 24 months   Guideline developer: UpToDate (See UpToDate for funding source) Date Released: 2014       Wellness Office Visit  Subjective:  Patient ID: Ashley Archer, female    DOB: 23-Aug-1971  Age: 48 y.o. MRN: [id number]  CC: This is an audiovisual telemedicine visit with the permission of the patient.  She is at home and I am in my office.  The patient was easily identified on video. This is a follow-up regarding her symptoms relating to ER visit she had on August 26, 2019 with symptoms of cough, fever, chest congestion. HPI She did undergo COVID-19 testing which was negative and also a chest x-ray which did not show any abnormalities.  She had contacted me regarding her symptoms not improving and also wheezing.  I felt that she had bronchitis and had sent in Zithromax and prednisone 40 mg daily for 5 days. She is almost finished the course of prednisone and the Zithromax now and she still has wheezing, although she does feel somewhat better overall. She has taken her own pulse oximeter readings and pulse rate and these are documented below, acceptable. She denies any fever or myalgias at this point.  Past Medical History:  Diagnosis Date  . Allergy   . Angioedema 02/11/2018  . CIN II (cervical intraepithelial neoplasia II)   . Fibroid   . GERD (gastroesophageal reflux disease)   . Perimenopause   . Urticaria   . Vitamin D deficiency disease       Family History  Problem Relation Age of Onset  . Hypertension Mother   . Cancer Mother 91       cervical  . Pancreatic cancer Father   . Cancer Father 25       pancreatic  . Diabetes Maternal Aunt   . Diabetes Maternal Grandmother   . 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   Social History   Tobacco Use  . Smoking status: Never Smoker  . Smokeless tobacco: Never Used  Substance Use Topics  . Alcohol use: No    Current Meds  Medication Sig  . acetaminophen (TYLENOL) 325 MG tablet Take 325 mg by mouth every 6 (six) hours as needed for mild pain or headache.  . albuterol (VENTOLIN HFA) 108 (90 Base) MCG/ACT inhaler Inhale 2 puffs into the lungs every 6 (six) hours as needed for wheezing or shortness of breath.  . Cholecalciferol (VITAMIN D-3) 125 MCG (5000 UT) TABS Take 10,000 Units by mouth daily.   Marland Kitchen EPINEPHrine (EPIPEN 2-PAK) 0.3 mg/0.3 mL IJ SOAJ injection Inject 0.3 mLs (0.3 mg total) into the muscle once as needed for up to 1 dose (for severe allergic reaction). CAll 911 immediately if you have to use this medicine  . fluticasone (FLONASE) 50 MCG/ACT nasal spray Place 1 spray into both nostrils 2 (two) times daily.  Marland Kitchen guaiFENesin (ROBITUSSIN) 100 MG/5ML liquid Take 200 mg by mouth 3 (three) times daily as needed for cough.  . loratadine (CLARITIN) 10 MG tablet Take 10 mg by mouth daily as needed for allergies.  . Phenylephrine-DM-GG-APAP (DAYQUIL SEVERE + VAPOCOOL) 5-10-200-325 MG/15ML LIQD Take 15 mLs by mouth as needed (  congestion).  . predniSONE (DELTASONE) 20 MG tablet Take 2 tablets (40 mg total) by mouth daily with breakfast.  . progesterone (PROMETRIUM) 200 MG capsule Take 200 mg by mouth every evening.  . Tafluprost, PF, (ZIOPTAN) 0.0015 % SOLN Place 1 drop into both eyes daily. Each eye   . vitamin E 400 UNIT capsule Take 400 Units by mouth daily.      Objective:   Today's Vitals: Pulse 68   SpO2 97%  Vitals with BMI 08/31/2019 08/26/2019 08/26/2019  Height (No Data) - -  Weight (No Data) - -  BMI - - -  Systolic (No Data) [id number]  Diastolic (No Data) 76 89  Pulse 68 72 85     Physical Exam  On video, she does look comfortable at rest  but she does have bouts of coughing and I can clearly hear wheezing with the coughing.  Throughout the conversation, she has slight difficulty with speaking because of her breathing.  She does appear to be alert and orientated.     Assessment   1. Wheezing   2. Bronchitis       Tests ordered No orders of the defined types were placed in this encounter.    Plan: 1. We discussed the possible diagnosis today and although her COVID-19 test was negative, it is entirely possible that she still has Covid 19 disease.  In any case, I am going to give her another 5-day course of prednisone and also now an albuterol inhaler as needed and I also showed her proper technique on the use of the inhaler should she need it.  She will finish off the Zithromax course and I do not think she needs further antibiotics at this stage. 2. I will follow up with her in the next 3 days to see how she is doing and hopefully she has improved.  At this point we will do another video telemedicine.   Meds ordered this encounter  Medications  . predniSONE (DELTASONE) 20 MG tablet    Sig: Take 2 tablets (40 mg total) by mouth daily with breakfast.    Dispense:  10 tablet    Refill:  1  . albuterol (VENTOLIN HFA) 108 (90 Base) MCG/ACT inhaler    Sig: Inhale 2 puffs into the lungs every 6 (six) hours as needed for wheezing or shortness of breath.    Dispense:  18 g    Refill:  1    Jamaurie Bernier Luther Parody, MD

## 2019-09-03 ENCOUNTER — Encounter (INDEPENDENT_AMBULATORY_CARE_PROVIDER_SITE_OTHER): Payer: Self-pay | Admitting: Internal Medicine

## 2019-09-03 ENCOUNTER — Telehealth (INDEPENDENT_AMBULATORY_CARE_PROVIDER_SITE_OTHER): Payer: 59 | Admitting: Internal Medicine

## 2019-09-03 ENCOUNTER — Other Ambulatory Visit: Payer: Self-pay

## 2019-09-03 DIAGNOSIS — J40 Bronchitis, not specified as acute or chronic: Secondary | ICD-10-CM | POA: Diagnosis not present

## 2019-09-03 DIAGNOSIS — R062 Wheezing: Secondary | ICD-10-CM | POA: Diagnosis not present

## 2019-09-03 NOTE — Progress Notes (Signed)
Metrics: Intervention Frequency ACO  Documented Smoking Status Yearly  Screened one or more times in 24 months  Cessation Counseling or  Active cessation medication Past 24 months  Past 24 months   Guideline developer: UpToDate (See UpToDate for funding source) Date Released: 2014       Wellness Office Visit  Subjective:  Patient ID: Ashley Archer, female    DOB: Oct 10, 1970  Age: 48 y.o. MRN: [id number]  CC: This is an audio telemedicine visit with the permission of the patient who is at home and I am in my office.  I was easily able to recognize her on the video.  This visit is to follow-up on her most recent symptoms of wheezing and chest discomfort. HPI  I had been treating her for diagnosis of probable bronchitis.  She did go to the emergency room about 8 days ago and her Covid test was negative and chest x-ray also was negative.  Since this time, she is being treated by me with course of prednisone and also albuterol inhaler.  She feels that she has improved to some degree since the last time I saw her via video telemedicine 3 days ago but she is still troubled with dyspnea and some wheezing.  She denies a fever still and she does not have myalgias. Past Medical History:  Diagnosis Date  . Allergy   . Angioedema 02/11/2018  . CIN II (cervical intraepithelial neoplasia II)   . Fibroid   . GERD (gastroesophageal reflux disease)   . Perimenopause   . Urticaria   . Vitamin D deficiency disease       Family History  Problem Relation Age of Onset  . Hypertension Mother   . Cancer Mother 84       cervical  . Pancreatic cancer Father   . Cancer Father 75       pancreatic  . Diabetes Maternal Aunt   . Diabetes Maternal Grandmother   . 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   Social History   Tobacco Use  . Smoking  status: Never Smoker  . Smokeless tobacco: Never Used  Substance Use Topics  . Alcohol use: No    Current Meds  Medication Sig  . acetaminophen (TYLENOL) 325 MG tablet Take 325 mg by mouth every 6 (six) hours as needed for mild pain or headache.  . albuterol (VENTOLIN HFA) 108 (90 Base) MCG/ACT inhaler Inhale 2 puffs into the lungs every 6 (six) hours as needed for wheezing or shortness of breath.  . Cholecalciferol (VITAMIN D-3) 125 MCG (5000 UT) TABS Take 10,000 Units by mouth daily.   Marland Kitchen EPINEPHrine (EPIPEN 2-PAK) 0.3 mg/0.3 mL IJ SOAJ injection Inject 0.3 mLs (0.3 mg total) into the muscle once as needed for up to 1 dose (for severe allergic reaction). CAll 911 immediately if you have to use this medicine  . fluticasone (FLONASE) 50 MCG/ACT nasal spray Place 1 spray into both nostrils 2 (two) times daily.  Marland Kitchen guaiFENesin (ROBITUSSIN) 100 MG/5ML liquid Take 200 mg by mouth 3 (three) times daily as needed for cough.  . loratadine (CLARITIN) 10 MG tablet Take 10 mg by mouth daily as needed for allergies.  . Phenylephrine-DM-GG-APAP (DAYQUIL SEVERE + VAPOCOOL) 5-10-200-325 MG/15ML LIQD Take 15 mLs by mouth as needed (congestion).  . predniSONE (DELTASONE) 20 MG tablet Take 2  tablets (40 mg total) by mouth daily with breakfast.  . progesterone (PROMETRIUM) 200 MG capsule Take 200 mg by mouth every evening.  . Tafluprost, PF, (ZIOPTAN) 0.0015 % SOLN Place 1 drop into both eyes daily. Each eye   . vitamin E 400 UNIT capsule Take 400 Units by mouth daily.       Objective:   Today's Vitals: There were no vitals taken for this visit. Vitals with BMI 09/03/2019 08/31/2019 08/26/2019  Height (No Data) (No Data) -  Weight (No Data) (No Data) -  BMI - - -  Systolic (No Data) (No Data) 99991111  Diastolic (No Data) (No Data) 76  Pulse - 68 72     Physical Exam  On video, she appears to be comfortable at rest with no respiratory distress and she is alert and orientated.     Assessment   1.  Wheezing   2. Bronchitis       Tests ordered Orders Placed This Encounter  Procedures  . Ambulatory referral to Pulmonology     Plan: 1. She will finish the course of prednisone that I had sent on the last visit and continue with albuterol inhaler as needed which seems to be helping to some degree. 2. I will refer to Pulmonology in [facility] for further evaluation at this point.   No orders of the defined types were placed in this encounter.   Doree Albee, MD

## 2019-09-09 ENCOUNTER — Telehealth: Payer: Self-pay | Admitting: Critical Care Medicine

## 2019-09-09 ENCOUNTER — Encounter (INDEPENDENT_AMBULATORY_CARE_PROVIDER_SITE_OTHER): Payer: Self-pay | Admitting: Internal Medicine

## 2019-09-09 NOTE — Telephone Encounter (Signed)
I texted Arby Barrette and she is fine to see the patient . Please call the patient and let them know. Thanks

## 2019-09-09 NOTE — Telephone Encounter (Signed)
Spoke with pt. States that she has a consult with Dr. Carlis Abbott tomorrow. Reports coughing and a "weird pain" in her neck. The referral that was sent over for her consult is for chronic bronchitis, wheezing. Pt states that the cough has been "going on for a while." Denies fever/chills, congestion/runny nose, sore throat, severe headache, joint pain, unexplained muscle aches, increased shortness of breath, loss of taste or smell, rash, N/V/D, abdominal pain, redness around/in the eye, increased weakness or unexplained bruising or bleeding. She would like to know if she can keep her appointment tomorrow.  Judson Roch - please advise as Dr. Carlis Abbott is not here today. Thanks!

## 2019-09-09 NOTE — Telephone Encounter (Signed)
Spoke with pt. She is aware of Ashley Archer/Dr. Ainsley Spinner response. Nothing further was needed at this time.

## 2019-09-10 ENCOUNTER — Encounter: Payer: Self-pay | Admitting: Critical Care Medicine

## 2019-09-10 ENCOUNTER — Ambulatory Visit (INDEPENDENT_AMBULATORY_CARE_PROVIDER_SITE_OTHER): Payer: 59 | Admitting: Critical Care Medicine

## 2019-09-10 ENCOUNTER — Other Ambulatory Visit: Payer: Self-pay

## 2019-09-10 VITALS — BP 130/84 | HR 72 | Temp 97.3°F | Ht 62.0 in | Wt 173.2 lb

## 2019-09-10 DIAGNOSIS — R05 Cough: Secondary | ICD-10-CM

## 2019-09-10 DIAGNOSIS — R058 Other specified cough: Secondary | ICD-10-CM

## 2019-09-10 DIAGNOSIS — R053 Chronic cough: Secondary | ICD-10-CM

## 2019-09-10 MED ORDER — BENZONATATE 200 MG PO CAPS: 200.0000 mg | ORAL_CAPSULE | Freq: Three times a day (TID) | ORAL | 1 refills | Status: DC | PRN

## 2019-09-10 MED ORDER — BREO ELLIPTA 100-25 MCG/INH IN AEPB: 1.0000 | INHALATION_SPRAY | Freq: Every day | RESPIRATORY_TRACT | 0 refills | Status: DC

## 2019-09-10 NOTE — Patient Instructions (Addendum)
Thank you for visiting Dr. Carlis Abbott at [facility]. We recommend the following:  Use Breo once daily and rinse your mouth after every use. You can still use your albuterol inhaler as needed. You can try using it before your Breo.  Voice rest: Try to minimize talking as much as possible for 2-3 days. Try hot liquids, cough suppressants, cough drops (not mint or menthol) for a few days to reduce throat irritation.   Meds ordered this encounter  Medications  . benzonatate (TESSALON) 200 MG capsule    Sig: Take 1 capsule (200 mg total) by mouth 3 (three) times daily as needed for cough.    Dispense:  50 capsule    Refill:  1    Return in about 4 weeks (around 10/08/2019).    Please do your part to reduce the spread of COVID-19.

## 2019-09-10 NOTE — Progress Notes (Signed)
Synopsis: Referred in December 2020 for wheezing and bronchitis by Doree Albee, MD .  Subjective:   PATIENT ID: Ashley Archer GENDER: female DOB: December 24, 1970, MRN: [id number]  Chief Complaint  Patient presents with   Consult    Patient went to 11/25 for sore throat, SOB and coughing. Patient still has dry cough, headaches and shortness of breath with exertion and laying down.     Ashley Archer is a 48 year old woman who presents for evaluation of persistent cough.  Her symptoms began in late November with headache, cough, shortness of breath, chest pain, loss of appetite, nausea, diarrhea, nasal congestion, prompting her to present to the emergency department on 11/25.  She had 2 negative Covid tests.  She was prescribed symptomatic treatment for her cough related chest pain.  Follow-up with her PCP she was given prednisone and an albuterol inhaler for dyspnea, wheezing, persistent cough.  She has noticed improvement in her symptoms overall, but still has occasional coughing, especially when talking.  Her cough is currently dry, but previously was productive.  She still has occasional shortness of breath, worse when laying down, improved when sitting back up.  Her shortness of breath is not worsened with exertion.  Her activity is only occasionally limited, but by coughing more than shortness of breath.  She has described a sensation of feeling tightness above her windpipe, which improves when sitting up.  She has noticed ongoing headaches and persistent fatigue, which is more symptomatic than shortness of breath.  She works as a Psychologist, prison and probation services and has not had any known Covid contacts or otherwise ill contacts.  She has been working from home during the pandemic as a Psychologist, prison and probation services.  She has a history of allergic rhinosinusitis, and has been taking Flonase and Claritin chronically.  She has not prescribed an ACE inhibitor.  She received her annual shot in October.  Never smoked or vape.   Family history significant for asthma in her son.       Past Medical History:  Diagnosis Date   Allergy    Angioedema 02/11/2018   to cefdinir   CIN II (cervical intraepithelial neoplasia II)    Fibroid    GERD (gastroesophageal reflux disease)    Perimenopause    Urticaria    Vitamin D deficiency disease      Family History  Problem Relation Age of Onset   Hypertension Mother    Cancer Mother 101       cervical   Pancreatic cancer Father    Cancer Father 37       pancreatic   Diabetes Maternal Aunt    Diabetes Maternal Grandmother    Breast cancer Paternal Grandmother        under 75   Asthma Son      Past Surgical History:  Procedure Laterality Date   CERVICAL BIOPSY  W/ LOOP ELECTRODE EXCISION  2001   CIN ll   DILATION AND CURETTAGE OF UTERUS     Thumb Surg      Social History   Socioeconomic History   Marital status: Married    Spouse name: Not on file   Number of children: Not on file   Years of education: Not on file   Highest education level: Not on file  Occupational History   Not on file  Tobacco Use   Smoking status: Never Smoker   Smokeless tobacco: Never Used  Substance and Sexual Activity   Alcohol use: No   Drug use:  No   Sexual activity: Yes    Birth control/protection: Condom  Other Topics Concern   Not on file  Social History Narrative   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   Social Determinants of Health   Financial Resource Strain:    Difficulty of Paying Living Expenses: Not on file  Food Insecurity:    Worried About Charity fundraiser in the Last Year: Not on file   [employer] of Food in the Last Year: Not on file  Transportation Needs:    Lack of Transportation (Medical): Not on file   Lack of Transportation (Non-Medical): Not on file  Physical Activity:    Days of Exercise per Week: Not on file   Minutes of Exercise per  Session: Not on file  Stress:    Feeling of Stress : Not on file  Social Connections:    Frequency of Communication with Friends and Family: Not on file   Frequency of Social Gatherings with Friends and Family: Not on file   Attends Religious Services: Not on file   Active Member of [employer] or Organizations: Not on file   Attends Archivist Meetings: Not on file   Marital Status: Not on file  Intimate Partner Violence:    Fear of Current or Ex-Partner: Not on file   Emotionally Abused: Not on file   Physically Abused: Not on file   Sexually Abused: Not on file     Allergies  Allergen Reactions   Cefdinir Anaphylaxis and Shortness Of Breath   Coconut Oil Swelling and Rash   Hydrocodone Rash     Immunization History  Administered Date(s) Administered   Influenza Split 09/27/2011   Influenza,inj,Quad PF,6+ Mos 07/30/2012, 07/15/2019    Outpatient Medications Prior to Visit  Medication Sig Dispense Refill   acetaminophen (TYLENOL) 325 MG tablet Take 325 mg by mouth every 6 (six) hours as needed for mild pain or headache.     albuterol (VENTOLIN HFA) 108 (90 Base) MCG/ACT inhaler Inhale 2 puffs into the lungs every 6 (six) hours as needed for wheezing or shortness of breath. 18 g 1   Cholecalciferol (VITAMIN D-3) 125 MCG (5000 UT) TABS Take 10,000 Units by mouth daily.      EPINEPHrine (EPIPEN 2-PAK) 0.3 mg/0.3 mL IJ SOAJ injection Inject 0.3 mLs (0.3 mg total) into the muscle once as needed for up to 1 dose (for severe allergic reaction). CAll 911 immediately if you have to use this medicine 2 Device 1   fluticasone (FLONASE) 50 MCG/ACT nasal spray Place 1 spray into both nostrils 2 (two) times daily. 16 g 5   loratadine (CLARITIN) 10 MG tablet Take 10 mg by mouth daily as needed for allergies.     Phenylephrine-DM-GG-APAP (DAYQUIL SEVERE + VAPOCOOL) 5-10-200-325 MG/15ML LIQD Take 15 mLs by mouth as needed (congestion).     progesterone (PROMETRIUM)  200 MG capsule Take 200 mg by mouth every evening.     Tafluprost, PF, (ZIOPTAN) 0.0015 % SOLN Place 1 drop into both eyes daily. Each eye      vitamin E 400 UNIT capsule Take 400 Units by mouth daily.     guaiFENesin (ROBITUSSIN) 100 MG/5ML liquid Take 200 mg by mouth 3 (three) times daily as needed for cough.     predniSONE (DELTASONE) 20 MG tablet Take 2 tablets (40 mg total) by mouth daily with breakfast. 10 tablet 1   No facility-administered medications prior to visit.    Review of  Systems  Constitutional: Negative for chills and fever.  HENT: Positive for nosebleeds. Negative for congestion and sore throat.   Eyes: Negative.   Respiratory: Positive for cough, shortness of breath and wheezing. Negative for sputum production.   Cardiovascular: Negative for chest pain and leg swelling.  Gastrointestinal: Positive for abdominal pain and nausea. Negative for diarrhea and heartburn.  Genitourinary: Negative for dysuria and hematuria.  Musculoskeletal: Negative for joint pain and myalgias.  Skin: Negative.   Neurological: Positive for headaches. Negative for weakness.  Endo/Heme/Allergies: Negative for environmental allergies.     Objective:   Vitals:   09/10/19 1439  BP: 130/84  Pulse: 72  Temp: (!) 97.3 F (36.3 C)  TempSrc: Temporal  SpO2: 98%  Weight: 173 lb 3.2 oz (78.6 kg)  Height: 5\' 2"  (1.575 m)   98% on   RA BMI Readings from Last 3 Encounters:  09/10/19 31.68 kg/m  07/15/19 30.54 kg/m  08/27/18 29.63 kg/m   Wt Readings from Last 3 Encounters:  09/10/19 173 lb 3.2 oz (78.6 kg)  07/15/19 167 lb (75.8 kg)  08/27/18 162 lb (73.5 kg)    Physical Exam Vitals reviewed.  Constitutional:      General: She is not in acute distress.    Appearance: Normal appearance. She is diaphoretic. She is not ill-appearing.  HENT:     Head: Normocephalic and atraumatic.     Nose:     Comments: Deferred due to masking requirement.    Mouth/Throat:     Comments:  Deferred due to masking requirement. Eyes:     General: No scleral icterus. Cardiovascular:     Rate and Rhythm: Normal rate and regular rhythm.     Heart sounds: No murmur.  Pulmonary:     Comments: Breathing comfortably on room air, no conversational dyspnea.  Frequent coughing.  Clear to auscultation bilaterally.  No coughing during deep inhalation. Abdominal:     General: There is no distension.     Palpations: Abdomen is soft.     Tenderness: There is no abdominal tenderness.  Musculoskeletal:        General: No swelling or deformity.     Cervical back: Neck supple.  Lymphadenopathy:     Cervical: No cervical adenopathy.  Skin:    General: Skin is warm.     Findings: No rash.  Neurological:     General: No focal deficit present.     Mental Status: She is alert.     Motor: No weakness.     Coordination: Coordination normal.  Psychiatric:        Mood and Affect: Mood normal.        Behavior: Behavior normal.      CBC    Component Value Date/Time   WBC 5.8 07/15/2019 1526   RBC 4.75 07/15/2019 1526   HGB 14.3 07/15/2019 1526   HGB 14.2 12/31/2017 1601   HCT 42.8 07/15/2019 1526   HCT 42.3 12/31/2017 1601   PLT 206 07/15/2019 1526   PLT 202 12/31/2017 1601   MCV 90.1 07/15/2019 1526   MCV 93 12/31/2017 1601   MCH 30.1 07/15/2019 1526   MCHC 33.4 07/15/2019 1526   RDW 11.8 07/15/2019 1526   RDW 12.7 12/31/2017 1601   LYMPHSABS 2.1 12/31/2017 1601   MONOABS 0.2 12/17/2017 1615   EOSABS 0.0 12/31/2017 1601   BASOSABS 0.0 12/31/2017 1601    Covid NAA x 2 on 08/26/2019 negative  Chest Imaging- films reviewed: CXR, 1 view 08/26/2019-normal CXR CT  chest 12/17/2017-mild airway thickening, linear scar in RML, inferior lingula, anterior LLL.  No opacities.  No significant adenopathy.   Pulmonary Functions Testing Results: No flowsheet data found.      Assessment & Plan:     ICD-10-CM   1. Chronic cough  R05   2. Post-viral cough syndrome  R05       Chronic cough likely due to post viral cough syndrome. -Breo once daily for 4 weeks -Continue albuterol as needed -Vocal rest-trial of 2 to 3 days of minimal talking/singing.  Aggressive cough suppression with over-the-counter cough medications, Tessalon 3 times daily, cough drops (no mint or menthol), drinking hot beverages. -Tessalon 3 times daily as needed -If no improvement of follow-up, we will pursue PFTs -Continue treatment for chronic rhinosinusitis.  RTC in 4 weeks.   Current Outpatient Medications:    acetaminophen (TYLENOL) 325 MG tablet, Take 325 mg by mouth every 6 (six) hours as needed for mild pain or headache., Disp: , Rfl:    albuterol (VENTOLIN HFA) 108 (90 Base) MCG/ACT inhaler, Inhale 2 puffs into the lungs every 6 (six) hours as needed for wheezing or shortness of breath., Disp: 18 g, Rfl: 1   Cholecalciferol (VITAMIN D-3) 125 MCG (5000 UT) TABS, Take 10,000 Units by mouth daily. , Disp: , Rfl:    EPINEPHrine (EPIPEN 2-PAK) 0.3 mg/0.3 mL IJ SOAJ injection, Inject 0.3 mLs (0.3 mg total) into the muscle once as needed for up to 1 dose (for severe allergic reaction). CAll 911 immediately if you have to use this medicine, Disp: 2 Device, Rfl: 1   fluticasone (FLONASE) 50 MCG/ACT nasal spray, Place 1 spray into both nostrils 2 (two) times daily., Disp: 16 g, Rfl: 5   loratadine (CLARITIN) 10 MG tablet, Take 10 mg by mouth daily as needed for allergies., Disp: , Rfl:    Phenylephrine-DM-GG-APAP (DAYQUIL SEVERE + VAPOCOOL) 5-10-200-325 MG/15ML LIQD, Take 15 mLs by mouth as needed (congestion)., Disp: , Rfl:    progesterone (PROMETRIUM) 200 MG capsule, Take 200 mg by mouth every evening., Disp: , Rfl:    Tafluprost, PF, (ZIOPTAN) 0.0015 % SOLN, Place 1 drop into both eyes daily. Each eye , Disp: , Rfl:    vitamin E 400 UNIT capsule, Take 400 Units by mouth daily., Disp: , Rfl:    benzonatate (TESSALON) 200 MG capsule, Take 1 capsule (200 mg total) by mouth 3  (three) times daily as needed for cough., Disp: 50 capsule, Rfl: 1   fluticasone furoate-vilanterol (BREO ELLIPTA) 100-25 MCG/INH AEPB, Inhale 1 puff into the lungs daily., Disp: 28 each, Rfl: 0   Julian Hy, DO [facility] 09/10/2019 5:24 PM

## 2019-09-11 ENCOUNTER — Encounter (INDEPENDENT_AMBULATORY_CARE_PROVIDER_SITE_OTHER): Payer: Self-pay | Admitting: Internal Medicine

## 2019-09-14 ENCOUNTER — Other Ambulatory Visit (INDEPENDENT_AMBULATORY_CARE_PROVIDER_SITE_OTHER): Payer: Self-pay | Admitting: Internal Medicine

## 2019-09-14 NOTE — Telephone Encounter (Signed)
Patient sent this message to Dr. Carlis Abbott this morning. Patient was seen by Dr. Carlis Abbott 09/10/19 for chronic cough.  Patient message -   believe I'm experiencing adverse side effects from the Benzonatate.  This morning I began to itch and soon discovered welt-like bumps on my face, chest, back and arms.  Also my eyes are burning. Burning persisted even after using refresh eye drops.  I only took 2 doses of Benzonatate.  I am not comfortable continuing this medication.  Also developed a sore throat today.  Very painful, level 8 on scale 1-10. Taking Acetaminophen and warm salt water gargle. My breathing has significantly improved.    Dr. Ainsley Spinner instructions-     Instructions    Return in about 4 weeks (around 10/08/2019). Thank you for visiting Dr. Carlis Abbott at [facility]. We recommend the following:  Use Breo once daily and rinse your mouth after every use. You can still use your albuterol inhaler as needed. You can try using it before your Breo.  Voice rest: Try to minimize talking as much as possible for 2-3 days. Try hot liquids, cough suppressants, cough drops (not mint or menthol) for a few days to reduce throat irritation.  Message routed to Judson Roch, NP to advise as provider of the day

## 2019-09-17 ENCOUNTER — Telehealth: Payer: Self-pay | Admitting: Acute Care

## 2019-09-17 NOTE — Telephone Encounter (Signed)
Pt. Sent an email regarding an adverse drug event. I have called the patient regarding her adverse drug reaction to Benzonatate. She developed  itching and  welt-like bumps on her  face, chest, back and arms. She also developed eye burning that  persisted even after using refresh eye drops. She had only taken  2 doses of Benzonatate. She was advised to stop taking the medication immediately by Kirk Ruths,  LPN who responded to the e mail. The patient  initiated Benadryl and Zyrtec on her own. Symptoms had totally resolved 24-36 hours later. She did not have any worsening to her breathing. She did not experience any mouth or tongue swelling. She purchased Robitussin DM over the counter, which has really helped with her cough. She has been using the Breo Dr. Carlis Abbott prescribed and she states that her breathing is much better with addition of this inhaler.   I have told her to contact us with any further issues and to call 911, or seek emergency care,  for any shortness of breath, mouth or tongue swelling. She verbalized understanding. I have also told her I will document this as an allergy with the specifics of her symptoms in Epic to ensure she does not get prescribed this medication, or medications that contain this drug  in the future.  Medications that were on the patient's current  medication profile that contained Benzonatate have been discontinued , and the patient was made aware of the medications she was taking  that contained Benzonatate. Epic has been updated.  ADR  to Benzonatate. Severity Medium to High Symptoms include : itching and  welt-like bumps on her  face, chest, back and arms. She also developed eye burning . No worsening shortness of breath or lip/ tongue swelling. Symptoms resolved within 36 hours of discontinuing medication and  with Benadryl and Zyrtec

## 2019-10-12 ENCOUNTER — Encounter (INDEPENDENT_AMBULATORY_CARE_PROVIDER_SITE_OTHER): Payer: Self-pay | Admitting: Internal Medicine

## 2019-10-13 ENCOUNTER — Encounter: Payer: Self-pay | Admitting: Internal Medicine

## 2019-10-13 ENCOUNTER — Ambulatory Visit: Payer: 59 | Admitting: Critical Care Medicine

## 2019-10-21 DIAGNOSIS — Z20828 Contact with and (suspected) exposure to other viral communicable diseases: Secondary | ICD-10-CM | POA: Diagnosis not present

## 2019-10-28 ENCOUNTER — Ambulatory Visit: Payer: 59 | Admitting: Critical Care Medicine

## 2019-10-28 ENCOUNTER — Encounter: Payer: Self-pay | Admitting: Critical Care Medicine

## 2019-10-28 ENCOUNTER — Other Ambulatory Visit: Payer: Self-pay

## 2019-10-28 VITALS — BP 120/78 | HR 63 | Temp 97.2°F | Ht 62.0 in | Wt 172.8 lb

## 2019-10-28 DIAGNOSIS — J4520 Mild intermittent asthma, uncomplicated: Secondary | ICD-10-CM | POA: Diagnosis not present

## 2019-10-28 MED ORDER — BREO ELLIPTA 100-25 MCG/INH IN AEPB: 1.0000 | INHALATION_SPRAY | Freq: Every day | RESPIRATORY_TRACT | 11 refills | Status: DC

## 2019-10-28 MED ORDER — ALBUTEROL SULFATE HFA 108 (90 BASE) MCG/ACT IN AERS: 2.0000 | INHALATION_SPRAY | RESPIRATORY_TRACT | 5 refills | Status: DC | PRN

## 2019-10-28 NOTE — Patient Instructions (Addendum)
Thank you for visiting Dr. Carlis Abbott at [facility]. We recommend the following: Orders Placed This Encounter  Procedures  . Pulmonary Function Test   Orders Placed This Encounter  Procedures  . Pulmonary Function Test    Standing Status:   Future    Standing Expiration Date:   10/27/2020    Order Specific Question:   Where should this test be performed?    Answer:   [facility]    Order Specific Question:   Full PFT: includes the following: basic spirometry, spirometry pre & post bronchodilator, diffusion capacity (DLCO), lung volumes    Answer:   Full PFT    Meds ordered this encounter  Medications  . fluticasone furoate-vilanterol (BREO ELLIPTA) 100-25 MCG/INH AEPB    Sig: Inhale 1 puff into the lungs daily.    Dispense:  1 each    Refill:  11  . albuterol (VENTOLIN HFA) 108 (90 Base) MCG/ACT inhaler    Sig: Inhale 2 puffs into the lungs every 4 (four) hours as needed for wheezing or shortness of breath.    Dispense:  18 g    Refill:  5    Return in about 2 months (around 12/26/2019). after PFTs.    Please do your part to reduce the spread of COVID-19.

## 2019-10-28 NOTE — Progress Notes (Signed)
Synopsis: Referred in December 2020 for wheezing and bronchitis by Doree Albee, MD .  Subjective:   PATIENT ID: Ashley Archer DOB: 10-03-1970, MRN: [id number]  No chief complaint on file.   Ashley Archer is a 49 year old woman who presents for follow-up.  She is doing well since her last visit.  Her cough is significantly improved after using Breo for 4 weeks.  She is now for about 2 weeks with sustained benefit, but she still has mild dyspnea on exertion.  She continues to use albuterol periodically.  She has a history of mild allergies causing similar symptoms and has had issues with strong smells triggering respiratory symptoms in the past.  She has an allergy to being around dogs, and specifically had an issue when she was exposed to at home with dogs at work recently (she works as a Merchandiser, retail), her symptoms improved with allergy medications.  She had her first Covid vaccine on 1/13.  After her last visit she had a reaction to Tessalon, causing hives and eye irritation.  This is subsequently been added to her allergy list.       09/10/2019: Ashley Archer is a 49 year old woman who presents for evaluation of persistent cough.  Her symptoms began in late November with headache, cough, shortness of breath, chest pain, loss of appetite, nausea, diarrhea, nasal congestion, prompting her to present to the emergency department on 11/25.  She had 2 negative Covid tests.  She was prescribed symptomatic treatment for her cough related chest pain.  Follow-up with her PCP she was given prednisone and an albuterol inhaler for dyspnea, wheezing, persistent cough.  She has noticed improvement in her symptoms overall, but still has occasional coughing, especially when talking.  Her cough is currently dry, but previously was productive.  She still has occasional shortness of breath, worse when laying down, improved when sitting back up.  Her shortness of breath is not worsened with  exertion.  Her activity is only occasionally limited, but by coughing more than shortness of breath.  She has described a sensation of feeling tightness above her windpipe, which improves when sitting up.  She has noticed ongoing headaches and persistent fatigue, which is more symptomatic than shortness of breath.  She works as a Psychologist, prison and probation services and has not had any known Covid contacts or otherwise ill contacts.  She has been working from home during the pandemic as a Psychologist, prison and probation services.  She has a history of allergic rhinosinusitis, and has been taking Flonase and Claritin chronically.  She has not prescribed an ACE inhibitor.  She received her annual shot in October.  Never smoked or vape.  Family history significant for asthma in her son.    Past Medical History:  Diagnosis Date  . Allergy   . Angioedema 02/11/2018   to cefdinir  . CIN II (cervical intraepithelial neoplasia II)   . Fibroid   . GERD (gastroesophageal reflux disease)   . Perimenopause   . Urticaria   . Vitamin D deficiency disease      Family History  Problem Relation Age of Onset  . Hypertension Mother   . Cancer Mother 54       cervical  . Pancreatic cancer Father   . Cancer Father 51       pancreatic  . Diabetes Maternal Aunt   . Diabetes Maternal Grandmother   . Breast cancer Paternal Grandmother        under 68  . Asthma Son  Past Surgical History:  Procedure Laterality Date  . CERVICAL BIOPSY  W/ LOOP ELECTRODE EXCISION  2001   CIN ll  . DILATION AND CURETTAGE OF UTERUS    . Thumb Surg      Social History   Socioeconomic History  . Marital status: Married    Spouse name: Not on file  . Number of children: Not on file  . Years of education: Not on file  . Highest education level: Not on file  Occupational History  . Not on file  Tobacco Use  . Smoking status: Never Smoker  . Smokeless tobacco: Never Used  Substance and Sexual Activity  . Alcohol use: No  . Drug use: No  . Sexual  activity: Yes    Birth control/protection: Condom  Other Topics Concern  . 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   Social Determinants of Health   Financial Resource Strain:   . Difficulty of Paying Living Expenses: Not on file  Food Insecurity:   . Worried About Charity fundraiser in the Last Year: Not on file  . Ran Out of Food in the Last Year: Not on file  Transportation Needs:   . Lack of Transportation (Medical): Not on file  . Lack of Transportation (Non-Medical): Not on file  Physical Activity:   . Days of Exercise per Week: Not on file  . Minutes of Exercise per Session: Not on file  Stress:   . Feeling of Stress : Not on file  Social Connections:   . Frequency of Communication with Friends and Family: Not on file  . Frequency of Social Gatherings with Friends and Family: Not on file  . Attends Religious Services: Not on file  . Active Member of Clubs or Organizations: Not on file  . Attends Archivist Meetings: Not on file  . Marital Status: Not on file  Intimate Partner Violence:   . Fear of Current or Ex-Partner: Not on file  . Emotionally Abused: Not on file  . Physically Abused: Not on file  . Sexually Abused: Not on file     Allergies  Allergen Reactions  . Cefdinir Anaphylaxis and Shortness Of Breath  . Benzonatate Hives and Itching    Pt developed  itching and  welt-like bumps on her  face, chest, back and arms. She also developed eye burning . No worsening shortness of breath or lip/ tongue swelling. Symptoms resolved within 36 hours with immediate discontinuation of the medication and  Benadryl and Zyrtec po   . Coconut Oil Swelling and Rash  . Hydrocodone Rash     Immunization History  Administered Date(s) Administered  . Influenza Split 09/27/2011  . Influenza,inj,Quad PF,6+ Mos 07/30/2012, 07/15/2019    Outpatient Medications Prior to  Visit  Medication Sig Dispense Refill  . acetaminophen (TYLENOL) 325 MG tablet Take 325 mg by mouth every 6 (six) hours as needed for mild pain or headache.    . albuterol (VENTOLIN HFA) 108 (90 Base) MCG/ACT inhaler Inhale 2 puffs into the lungs every 6 (six) hours as needed for wheezing or shortness of breath. 18 g 1  . Cholecalciferol (VITAMIN D-3) 125 MCG (5000 UT) TABS Take 10,000 Units by mouth daily.     Marland Kitchen EPINEPHrine (EPIPEN 2-PAK) 0.3 mg/0.3 mL IJ SOAJ injection Inject 0.3 mLs (0.3 mg total) into the muscle once as needed for up to 1 dose (for severe allergic reaction). CAll 911  immediately if you have to use this medicine 2 Device 1  . fluticasone (FLONASE) 50 MCG/ACT nasal spray SPRAY 2 SPRAY INTO EACH NOSTRIL EVERY DAY 48 mL 1  . fluticasone furoate-vilanterol (BREO ELLIPTA) 100-25 MCG/INH AEPB Inhale 1 puff into the lungs daily. 28 each 0  . loratadine (CLARITIN) 10 MG tablet Take 10 mg by mouth daily as needed for allergies.    . progesterone (PROMETRIUM) 200 MG capsule Take 200 mg by mouth every evening.    . Tafluprost, PF, (ZIOPTAN) 0.0015 % SOLN Place 1 drop into both eyes daily. Each eye     . vitamin E 400 UNIT capsule Take 400 Units by mouth daily.     No facility-administered medications prior to visit.    Review of Systems  Constitutional: Negative for chills and fever.  HENT: Positive for nosebleeds. Negative for congestion and sore throat.   Eyes: Negative.   Respiratory: Positive for cough, shortness of breath and wheezing. Negative for sputum production.   Cardiovascular: Negative for chest pain and leg swelling.  Gastrointestinal: Positive for abdominal pain and nausea. Negative for diarrhea and heartburn.  Genitourinary: Negative for dysuria and hematuria.  Musculoskeletal: Negative for joint pain and myalgias.  Skin: Negative.   Neurological: Positive for headaches. Negative for weakness.  Endo/Heme/Allergies: Negative for environmental allergies.      Objective:   There were no vitals filed for this visit.   on   RA BMI Readings from Last 3 Encounters:  09/10/19 31.68 kg/m  07/15/19 30.54 kg/m  08/27/18 29.63 kg/m   Wt Readings from Last 3 Encounters:  09/10/19 173 lb 3.2 oz (78.6 kg)  07/15/19 167 lb (75.8 kg)  08/27/18 162 lb (73.5 kg)    Physical Exam Vitals reviewed.  Constitutional:      General: She is not in acute distress.    Appearance: Normal appearance. She is not ill-appearing.  HENT:     Head: Normocephalic and atraumatic.     Nose:     Comments: Deferred due to masking requirement.    Mouth/Throat:     Comments: Deferred due to masking requirement. Eyes:     General: No scleral icterus. Cardiovascular:     Rate and Rhythm: Normal rate and regular rhythm.     Heart sounds: No murmur.  Pulmonary:     Comments: Comfortably in room air, clear to auscultation bilaterally. Abdominal:     General: There is no distension.     Palpations: Abdomen is soft.     Tenderness: There is no abdominal tenderness.  Musculoskeletal:        General: No swelling or deformity.     Cervical back: Neck supple.  Lymphadenopathy:     Cervical: No cervical adenopathy.  Skin:    General: Skin is warm and dry.     Findings: No rash.  Neurological:     General: No focal deficit present.     Mental Status: She is alert.     Motor: No weakness.     Coordination: Coordination normal.  Psychiatric:        Mood and Affect: Mood normal.        Behavior: Behavior normal.      CBC    Component Value Date/Time   WBC 5.8 07/15/2019 1526   RBC 4.75 07/15/2019 1526   HGB 14.3 07/15/2019 1526   HGB 14.2 12/31/2017 1601   HCT 42.8 07/15/2019 1526   HCT 42.3 12/31/2017 1601   PLT 206 07/15/2019 1526  PLT 202 12/31/2017 1601   MCV 90.1 07/15/2019 1526   MCV 93 12/31/2017 1601   MCH 30.1 07/15/2019 1526   MCHC 33.4 07/15/2019 1526   RDW 11.8 07/15/2019 1526   RDW 12.7 12/31/2017 1601   LYMPHSABS 2.1 12/31/2017 1601    MONOABS 0.2 12/17/2017 1615   EOSABS 0.0 12/31/2017 1601   BASOSABS 0.0 12/31/2017 1601    Covid NAA x 2 on 08/26/2019 negative  Chest Imaging- films reviewed: CXR, 1 view 08/26/2019-normal CXR CT chest 12/17/2017-mild airway thickening, linear scar in RML, inferior lingula, anterior LLL.  No opacities.  No significant adenopathy.   Pulmonary Functions Testing Results: No flowsheet data found.      Assessment & Plan:     ICD-10-CM   1. Mild intermittent asthma without complication  [id number] Pulmonary Function Test     Mild intermittent asthma causing chronic cough, which is improved, and mild dyspnea on exertion.  This episode was likely triggered by a respiratory virus. -Restart Breo once daily, which should be continued for 3 to 6 months until her symptoms have returned to baseline and stabilized -Albuterol as needed -PFTs -Allergy medications and trigger avoidance.   RTC in 2 months.   Current Outpatient Medications:  .  acetaminophen (TYLENOL) 325 MG tablet, Take 325 mg by mouth every 6 (six) hours as needed for mild pain or headache., Disp: , Rfl:  .  albuterol (VENTOLIN HFA) 108 (90 Base) MCG/ACT inhaler, Inhale 2 puffs into the lungs every 6 (six) hours as needed for wheezing or shortness of breath., Disp: 18 g, Rfl: 1 .  Cholecalciferol (VITAMIN D-3) 125 MCG (5000 UT) TABS, Take 10,000 Units by mouth daily. , Disp: , Rfl:  .  EPINEPHrine (EPIPEN 2-PAK) 0.3 mg/0.3 mL IJ SOAJ injection, Inject 0.3 mLs (0.3 mg total) into the muscle once as needed for up to 1 dose (for severe allergic reaction). CAll 911 immediately if you have to use this medicine, Disp: 2 Device, Rfl: 1 .  fluticasone (FLONASE) 50 MCG/ACT nasal spray, SPRAY 2 SPRAY INTO EACH NOSTRIL EVERY DAY, Disp: 48 mL, Rfl: 1 .  fluticasone furoate-vilanterol (BREO ELLIPTA) 100-25 MCG/INH AEPB, Inhale 1 puff into the lungs daily., Disp: 28 each, Rfl: 0 .  loratadine (CLARITIN) 10 MG tablet, Take 10 mg by mouth daily  as needed for allergies., Disp: , Rfl:  .  progesterone (PROMETRIUM) 200 MG capsule, Take 200 mg by mouth every evening., Disp: , Rfl:  .  Tafluprost, PF, (ZIOPTAN) 0.0015 % SOLN, Place 1 drop into both eyes daily. Each eye , Disp: , Rfl:  .  vitamin E 400 UNIT capsule, Take 400 Units by mouth daily., Disp: , Rfl:    Julian Hy, DO [facility] 10/28/2019 8:25 PM

## 2019-10-30 ENCOUNTER — Other Ambulatory Visit (INDEPENDENT_AMBULATORY_CARE_PROVIDER_SITE_OTHER): Payer: Self-pay | Admitting: Internal Medicine

## 2019-11-16 ENCOUNTER — Ambulatory Visit (INDEPENDENT_AMBULATORY_CARE_PROVIDER_SITE_OTHER): Payer: 59 | Admitting: Internal Medicine

## 2019-11-17 ENCOUNTER — Other Ambulatory Visit (INDEPENDENT_AMBULATORY_CARE_PROVIDER_SITE_OTHER): Payer: Self-pay | Admitting: Nurse Practitioner

## 2019-11-17 DIAGNOSIS — N951 Menopausal and female climacteric states: Secondary | ICD-10-CM

## 2019-11-17 MED ORDER — PROGESTERONE MICRONIZED 200 MG PO CAPS: 200.0000 mg | ORAL_CAPSULE | Freq: Every evening | ORAL | 3 refills | Status: DC

## 2019-11-20 ENCOUNTER — Encounter (INDEPENDENT_AMBULATORY_CARE_PROVIDER_SITE_OTHER): Payer: Self-pay | Admitting: Nurse Practitioner

## 2019-11-20 ENCOUNTER — Encounter (INDEPENDENT_AMBULATORY_CARE_PROVIDER_SITE_OTHER): Payer: 59 | Admitting: Nurse Practitioner

## 2019-11-20 ENCOUNTER — Other Ambulatory Visit: Payer: Self-pay

## 2019-11-20 NOTE — Progress Notes (Signed)
Patient scheduled for virtual f/u but stated she was unable to complete visit due to working at this time. She stated she would call office back to reschedule.   This encounter was created in error - please disregard.

## 2019-11-23 ENCOUNTER — Ambulatory Visit (INDEPENDENT_AMBULATORY_CARE_PROVIDER_SITE_OTHER): Payer: 59 | Admitting: Internal Medicine

## 2019-11-27 DIAGNOSIS — Z20828 Contact with and (suspected) exposure to other viral communicable diseases: Secondary | ICD-10-CM | POA: Diagnosis not present

## 2019-12-07 ENCOUNTER — Encounter (INDEPENDENT_AMBULATORY_CARE_PROVIDER_SITE_OTHER): Payer: Self-pay

## 2019-12-22 ENCOUNTER — Other Ambulatory Visit (HOSPITAL_COMMUNITY)
Admission: RE | Admit: 2019-12-22 | Discharge: 2019-12-22 | Disposition: A | Discharge status: Home / Self Care | Payer: 59 | Source: Ambulatory Visit | Attending: Critical Care Medicine | Admitting: Critical Care Medicine

## 2019-12-22 DIAGNOSIS — Z20822 Contact with and (suspected) exposure to covid-19: Secondary | ICD-10-CM | POA: Diagnosis not present

## 2019-12-22 DIAGNOSIS — Z01812 Encounter for preprocedural laboratory examination: Secondary | ICD-10-CM | POA: Diagnosis not present

## 2019-12-22 LAB — SARS CORONAVIRUS 2 (TAT 6-24 HRS): SARS Coronavirus 2: NEGATIVE

## 2019-12-25 ENCOUNTER — Ambulatory Visit (INDEPENDENT_AMBULATORY_CARE_PROVIDER_SITE_OTHER): Payer: 59 | Admitting: Primary Care

## 2019-12-25 ENCOUNTER — Encounter: Payer: Self-pay | Admitting: Primary Care

## 2019-12-25 ENCOUNTER — Other Ambulatory Visit: Payer: Self-pay

## 2019-12-25 ENCOUNTER — Ambulatory Visit (INDEPENDENT_AMBULATORY_CARE_PROVIDER_SITE_OTHER): Payer: 59 | Admitting: Critical Care Medicine

## 2019-12-25 DIAGNOSIS — Z712 Person consulting for explanation of examination or test findings: Secondary | ICD-10-CM | POA: Diagnosis not present

## 2019-12-25 DIAGNOSIS — R058 Other specified cough: Secondary | ICD-10-CM

## 2019-12-25 DIAGNOSIS — J4520 Mild intermittent asthma, uncomplicated: Secondary | ICD-10-CM

## 2019-12-25 DIAGNOSIS — R05 Cough: Secondary | ICD-10-CM

## 2019-12-25 LAB — PULMONARY FUNCTION TEST
DL/VA % pred: 152 %
DL/VA: 6.71 ml/min/mmHg/L
DLCO cor % pred: 123 %
DLCO cor: 24.54 ml/min/mmHg
DLCO unc % pred: 123 %
DLCO unc: 24.54 ml/min/mmHg
FEF 25-75 Post: 3.01 L/sec
FEF 25-75 Pre: 2.98 L/sec
FEF2575-%Change-Post: 1 %
FEF2575-%Pred-Post: 123 %
FEF2575-%Pred-Pre: 122 %
FEV1-%Change-Post: 2 %
FEV1-%Pred-Post: 106 %
FEV1-%Pred-Pre: 104 %
FEV1-Post: 2.36 L
FEV1-Pre: 2.31 L
FEV1FVC-%Change-Post: -2 %
FEV1FVC-%Pred-Pre: 104 %
FEV6-%Change-Post: 6 %
FEV6-%Pred-Post: 105 %
FEV6-%Pred-Pre: 99 %
FEV6-Post: 2.82 L
FEV6-Pre: 2.65 L
FEV6FVC-%Pred-Post: 102 %
FEV6FVC-%Pred-Pre: 102 %
FVC-%Change-Post: 4 %
FVC-%Pred-Post: 102 %
FVC-%Pred-Pre: 98 %
FVC-Post: 2.82 L
FVC-Pre: 2.69 L
Post FEV1/FVC ratio: 84 %
Post FEV6/FVC ratio: 100 %
Pre FEV1/FVC ratio: 86 %
Pre FEV6/FVC Ratio: 100 %
RV % pred: 83 %
RV: 1.36 L
TLC % pred: 89 %
TLC: 4.25 L

## 2019-12-25 NOTE — Progress Notes (Signed)
Full PFT performed today. °

## 2019-12-25 NOTE — Progress Notes (Signed)
@Patient  ID: Ashley Archer, female    DOB: 22-Sep-1971, 49 y.o.   MRN: [id number]  Chief Complaint  Patient presents with  . Follow-up    PFT today     Referring provider: Doree Albee, MD  HPI: 49 year old female, never smoked. PMH significant for mild intermittent asthma. Patient of Dr. Carlis Abbott, last seen on 10/28/19 for post viral cough. She remains on Breo 100, ordered for PFTs to rule out underlying asthma.   12/25/2019 Patient presents today for follow-up visit with PFTs. She is doing well, no acute complaints. She stopped taking Breo 1-2 weeks ago and has had no issues with shortness of breath or cough. Her pulmonary function testing today appear normal. No prior history of childhood asthma.    PFTs 12/25/2019 - FVC 2.82 (102%), FEV1 2.36 (106%), ratio 84, 2% BD, TLC 89%, DLCOcor 24.54 (123%) Normal spirometry. No bronchodilator response. Normal diffusion capacity.   Allergies  Allergen Reactions  . Cefdinir Anaphylaxis and Shortness Of Breath  . Benzonatate Hives and Itching    Pt developed  itching and  welt-like bumps on her  face, chest, back and arms. She also developed eye burning . No worsening shortness of breath or lip/ tongue swelling. Symptoms resolved within 36 hours with immediate discontinuation of the medication and  Benadryl and Zyrtec po   . Coconut Oil Swelling and Rash  . Hydrocodone Rash    Immunization History  Administered Date(s) Administered  . Influenza Split 09/27/2011  . Influenza,inj,Quad PF,6+ Mos 07/30/2012, 07/15/2019  . PFIZER SARS-COV-2 Vaccination 10/14/2019    Past Medical History:  Diagnosis Date  . Allergy   . Angioedema 02/11/2018   to cefdinir  . CIN II (cervical intraepithelial neoplasia II)   . Fibroid   . GERD (gastroesophageal reflux disease)   . Perimenopause   . Urticaria   . Vitamin D deficiency disease     Tobacco History: Social History   Tobacco Use  Smoking Status Never Smoker  Smokeless Tobacco  Never Used   Counseling given: Not Answered   Outpatient Medications Prior to Visit  Medication Sig Dispense Refill  . acetaminophen (TYLENOL) 325 MG tablet Take 325 mg by mouth every 6 (six) hours as needed for mild pain or headache.    . albuterol (VENTOLIN HFA) 108 (90 Base) MCG/ACT inhaler Inhale 2 puffs into the lungs every 4 (four) hours as needed for wheezing or shortness of breath. 18 g 5  . Cholecalciferol (VITAMIN D-3) 125 MCG (5000 UT) TABS Take 10,000 Units by mouth daily.     Marland Kitchen EPINEPHRINE 0.3 mg/0.3 mL IJ SOAJ injection INJECT 0.3 MLS INTO THE MUSCLE ONCE AS NEEDED FOR 1 DOSE (SEVERE ALLERGIC REACTION) 2 each 1  . fluticasone (FLONASE) 50 MCG/ACT nasal spray SPRAY 2 SPRAY INTO EACH NOSTRIL EVERY DAY 48 mL 1  . fluticasone furoate-vilanterol (BREO ELLIPTA) 100-25 MCG/INH AEPB Inhale 1 puff into the lungs daily. 1 each 11  . loratadine (CLARITIN) 10 MG tablet Take 10 mg by mouth daily as needed for allergies.    . progesterone (PROMETRIUM) 200 MG capsule Take 1 capsule (200 mg total) by mouth every evening. 30 capsule 3  . Tafluprost, PF, (ZIOPTAN) 0.0015 % SOLN Place 1 drop into both eyes daily. Each eye     . vitamin E 400 UNIT capsule Take 400 Units by mouth daily.     No facility-administered medications prior to visit.   Review of Systems  Review of Systems  Constitutional: Negative.  Respiratory: Negative for cough, shortness of breath and wheezing.   Cardiovascular: Negative.    Physical Exam  BP 120/80 (BP Location: Left Arm, Cuff Size: Normal)   Pulse 74   Temp 98.1 F (36.7 C) (Temporal)   Ht 5\' 2"  (1.575 m)   Wt 170 lb (77.1 kg)   SpO2 99%   BMI 31.09 kg/m  Physical Exam Constitutional:      Appearance: Normal appearance.  HENT:     Head: Normocephalic and atraumatic.     Mouth/Throat:     Mouth: Mucous membranes are moist.     Pharynx: Oropharynx is clear.  Cardiovascular:     Rate and Rhythm: Normal rate and regular rhythm.  Pulmonary:      Effort: Pulmonary effort is normal.     Breath sounds: Normal breath sounds.  Neurological:     Mental Status: She is alert.  Psychiatric:        Mood and Affect: Mood normal.        Behavior: Behavior normal.        Thought Content: Thought content normal.        Judgment: Judgment normal.      Lab Results:  CBC    Component Value Date/Time   WBC 5.8 07/15/2019 1526   RBC 4.75 07/15/2019 1526   HGB 14.3 07/15/2019 1526   HGB 14.2 12/31/2017 1601   HCT 42.8 07/15/2019 1526   HCT 42.3 12/31/2017 1601   PLT 206 07/15/2019 1526   PLT 202 12/31/2017 1601   MCV 90.1 07/15/2019 1526   MCV 93 12/31/2017 1601   MCH 30.1 07/15/2019 1526   MCHC 33.4 07/15/2019 1526   RDW 11.8 07/15/2019 1526   RDW 12.7 12/31/2017 1601   LYMPHSABS 2.1 12/31/2017 1601   MONOABS 0.2 12/17/2017 1615   EOSABS 0.0 12/31/2017 1601   BASOSABS 0.0 12/31/2017 1601    BMET    Component Value Date/Time   NA 138 07/15/2019 1526   NA 141 12/31/2017 1601   K 4.1 07/15/2019 1526   CL 103 07/15/2019 1526   CO2 29 07/15/2019 1526   GLUCOSE 74 07/15/2019 1526   BUN 21 07/15/2019 1526   BUN 21 12/31/2017 1601   CREATININE 0.93 07/15/2019 1526   CALCIUM 9.7 07/15/2019 1526   GFRNONAA 73 07/15/2019 1526   GFRAA 85 07/15/2019 1526    BNP No results found for: BNP  ProBNP No results found for: PROBNP  Imaging: No results found.   Assessment & Plan:   Post-viral cough syndrome - Patient stopped BREO, no shortness or breath or cough  - Normal spirometry. No bronchodilator response. Normal diffusion capacity. FEV1 2.36 (106%), ratio 84, 2% BD - Recommend continue Claritin and Flonase seasonally for allergy symptoms - Follow-up as needed     Martyn Ehrich, NP 12/27/2019

## 2019-12-25 NOTE — Patient Instructions (Signed)
Pleasure meeting you today Ashley Archer - Your pulmonary function testing looked normal. No evidence of asthma  - Recommend you continue Claritin and Flonase seasonally for allergy symptoms - If your bronchitis symptoms return or require your albuterol inhaler frequently would recommend something called a methacholine challenge test. Otherwise, follow-up with [place] only as needed

## 2019-12-27 DIAGNOSIS — R058 Other specified cough: Secondary | ICD-10-CM | POA: Insufficient documentation

## 2019-12-27 DIAGNOSIS — R05 Cough: Secondary | ICD-10-CM | POA: Insufficient documentation

## 2019-12-27 NOTE — Assessment & Plan Note (Addendum)
-   Patient stopped BREO, no shortness or breath or cough  - Normal spirometry. No bronchodilator response. Normal diffusion capacity. FEV1 2.36 (106%), ratio 84, 2% BD - Recommend continue Claritin and Flonase seasonally for allergy symptoms - Follow-up as needed

## 2019-12-28 NOTE — Progress Notes (Signed)
That sounds reasonable. It is possible she has mild intermittent asthma which will periodically flare. We will see in the future, but I think deescalating meds is the right thing to do. Thanks!

## 2019-12-30 ENCOUNTER — Other Ambulatory Visit: Payer: Self-pay

## 2019-12-30 ENCOUNTER — Encounter (INDEPENDENT_AMBULATORY_CARE_PROVIDER_SITE_OTHER): Payer: Self-pay | Admitting: Internal Medicine

## 2019-12-30 ENCOUNTER — Ambulatory Visit (INDEPENDENT_AMBULATORY_CARE_PROVIDER_SITE_OTHER): Payer: 59 | Admitting: Internal Medicine

## 2019-12-30 VITALS — BP 139/80 | HR 75 | Temp 97.8°F | Ht 62.0 in | Wt 172.0 lb

## 2019-12-30 DIAGNOSIS — R079 Chest pain, unspecified: Secondary | ICD-10-CM | POA: Diagnosis not present

## 2019-12-30 DIAGNOSIS — N951 Menopausal and female climacteric states: Secondary | ICD-10-CM | POA: Diagnosis not present

## 2019-12-30 NOTE — Progress Notes (Signed)
Metrics: Intervention Frequency ACO  Documented Smoking Status Yearly  Screened one or more times in 24 months  Cessation Counseling or  Active cessation medication Past 24 months  Past 24 months   Guideline developer: UpToDate (See UpToDate for funding source) Date Released: 2014       Wellness Office Visit  Subjective:  Patient ID: Jim B Alyea, female    DOB: 1971/05/03  Age: 49 y.o. MRN: [id number]  CC: This lady comes in for follow-up of her perimenopausal state and recent respiratory symptoms that she has had since December of last year. HPI  She has been followed by pulmonology and although she is improved, she does not feel back to her normal self. She is now complaining of left neck pain which radiates and travels into the left chest area.  She has had several episodes of these, each episode lasting 10 to 15 minutes.  It feels that the discomfort is not very severe but slightly heavy.  She does not describe it as needing stronger pain medicines.  The pain can come on even at rest but has come on with exertion. Her perimenopausal symptoms of hot flashes have been improved but use of progesterone. Past Medical History:  Diagnosis Date  . Allergy   . Angioedema 02/11/2018   to cefdinir  . CIN II (cervical intraepithelial neoplasia II)   . Fibroid   . GERD (gastroesophageal reflux disease)   . Perimenopause   . Urticaria   . Vitamin D deficiency disease       Family History  Problem Relation Age of Onset  . Hypertension Mother   . Cancer Mother 19       cervical  . Pancreatic cancer Father   . Cancer Father 77       pancreatic  . Diabetes Maternal Aunt   . Diabetes Maternal Grandmother   . Breast cancer Paternal Grandmother        under 16  . 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   Social History   Tobacco Use  . Smoking status: Never  Smoker  . Smokeless tobacco: Never Used  Substance Use Topics  . Alcohol use: No    Current Meds  Medication Sig  . albuterol (VENTOLIN HFA) 108 (90 Base) MCG/ACT inhaler Inhale 2 puffs into the lungs every 4 (four) hours as needed for wheezing or shortness of breath.  . Ascorbic Acid (VITAMIN C WITH ROSE HIPS) 500 MG tablet Take 500 mg by mouth daily.  . Cholecalciferol (VITAMIN D-3) 125 MCG (5000 UT) TABS Take 10,000 Units by mouth daily.   Marland Kitchen EPINEPHRINE 0.3 mg/0.3 mL IJ SOAJ injection INJECT 0.3 MLS INTO THE MUSCLE ONCE AS NEEDED FOR 1 DOSE (SEVERE ALLERGIC REACTION)  . fluticasone (FLONASE) 50 MCG/ACT nasal spray SPRAY 2 SPRAY INTO EACH NOSTRIL EVERY DAY  . loratadine (CLARITIN) 10 MG tablet Take 10 mg by mouth daily as needed for allergies.  . progesterone (PROMETRIUM) 200 MG capsule Take 1 capsule (200 mg total) by mouth every evening.  . Tafluprost, PF, (ZIOPTAN) 0.0015 % SOLN Place 1 drop into both eyes daily. Each eye   . vitamin E 400 UNIT capsule Take 400 Units by mouth daily.       Objective:   Today's Vitals: BP 139/80 (BP Location: Left Arm, Patient Position: Sitting, Cuff Size: Normal)   Pulse 75   Temp 97.8 F (36.6 C) (  Temporal)   Ht 5\' 2"  (1.575 m)   Wt 172 lb (78 kg)   SpO2 98%   BMI 31.46 kg/m  Vitals with BMI 12/30/2019 12/25/2019 10/28/2019  Height 5\' 2"  5\' 2"  5\' 2"   Weight 172 lbs 170 lbs 172 lbs 13 oz  BMI 31.45 0000000 [id number]  Systolic [id number]  Diastolic 80 80 78  Pulse 75 74 63     Physical Exam  She looks systemically well.  No new physical findings today.  Blood pressure slightly elevated today systolically.     Assessment   1. Chest pain, unspecified type   2. Menopausal syndrome (hot flashes)       Tests ordered Orders Placed This Encounter  Procedures  . Ambulatory referral to Cardiology     Plan: 1. I will refer to cardiology to investigate further this chest pain.  It may be an atypical symptom for coronary artery  disease. 2. She will continue with progesterone for perimenopausal symptoms which seems to be helped. 3. I will see her in October for annual physical exam and we will do all the blood work then.   No orders of the defined types were placed in this encounter.   Doree Albee, MD

## 2020-01-10 IMAGING — US ULTRASOUND RIGHT BREAST LIMITED
1 series · 3 of 3 positions shown · non-contrast
Comparison: Previous exam(s).

CLINICAL DATA: Screening recall for possible right breast
asymmetry.

EXAM:
DIGITAL DIAGNOSTIC RIGHT MAMMOGRAM WITH CAD AND TOMO
ULTRASOUND RIGHT BREAST

[Series 1: ultrasound right breast limited · 0.07mm/px · 3 of 3 slices shown]
[im 1/3]
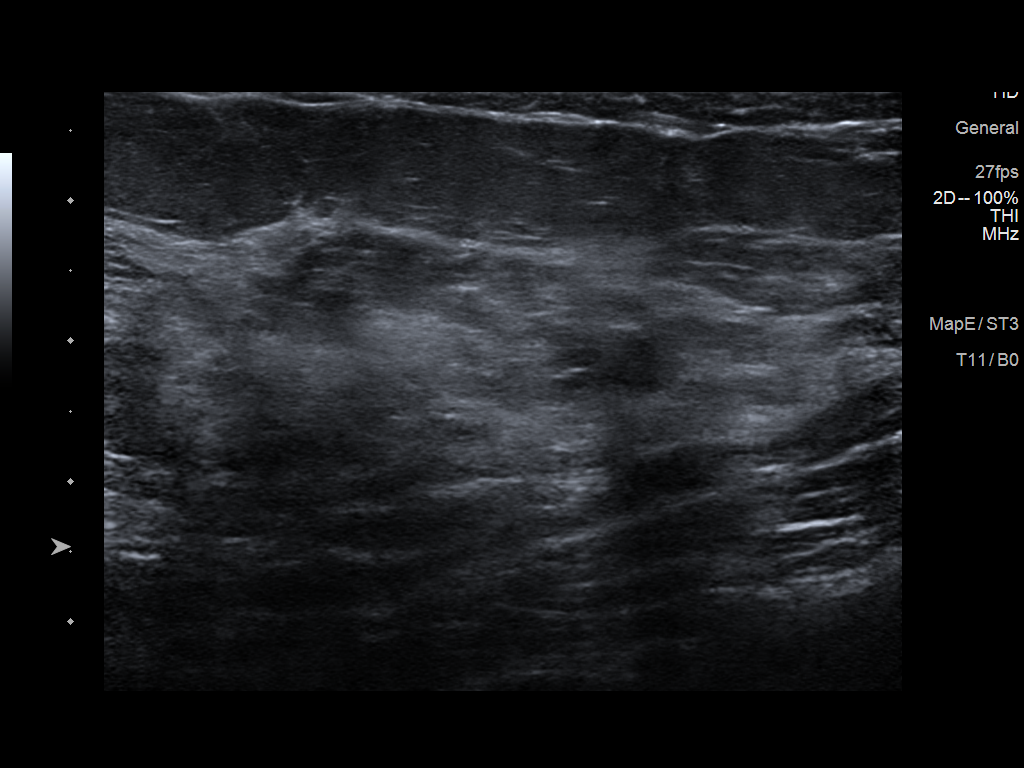
[im 2/3]
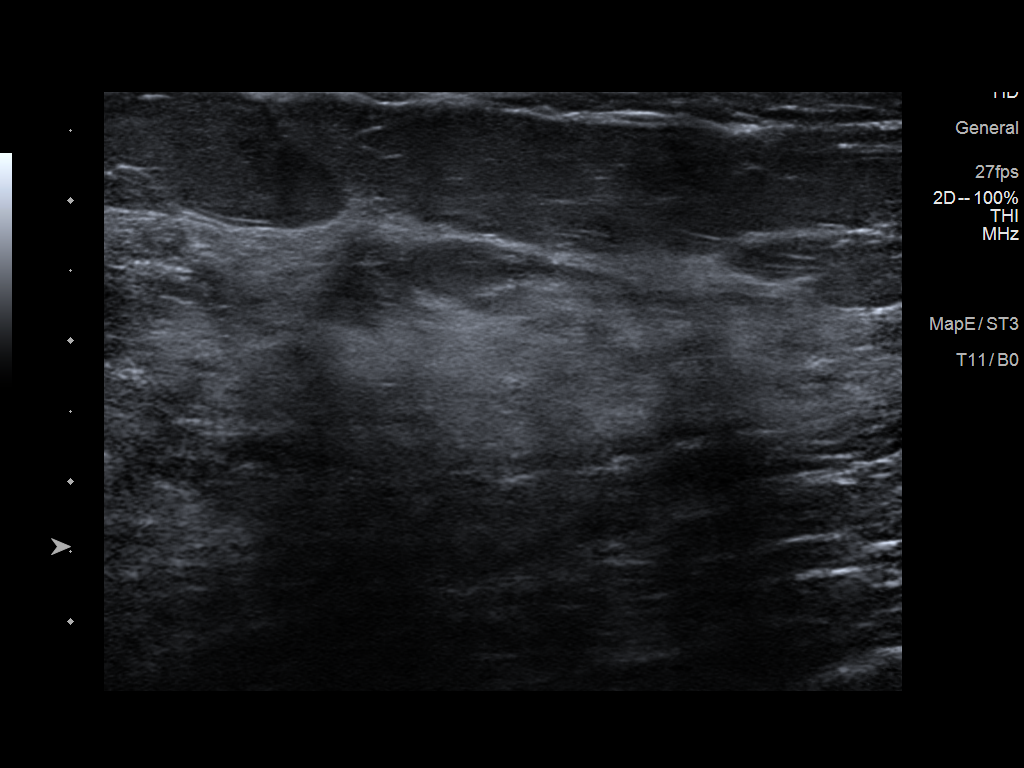
[im 3/3]
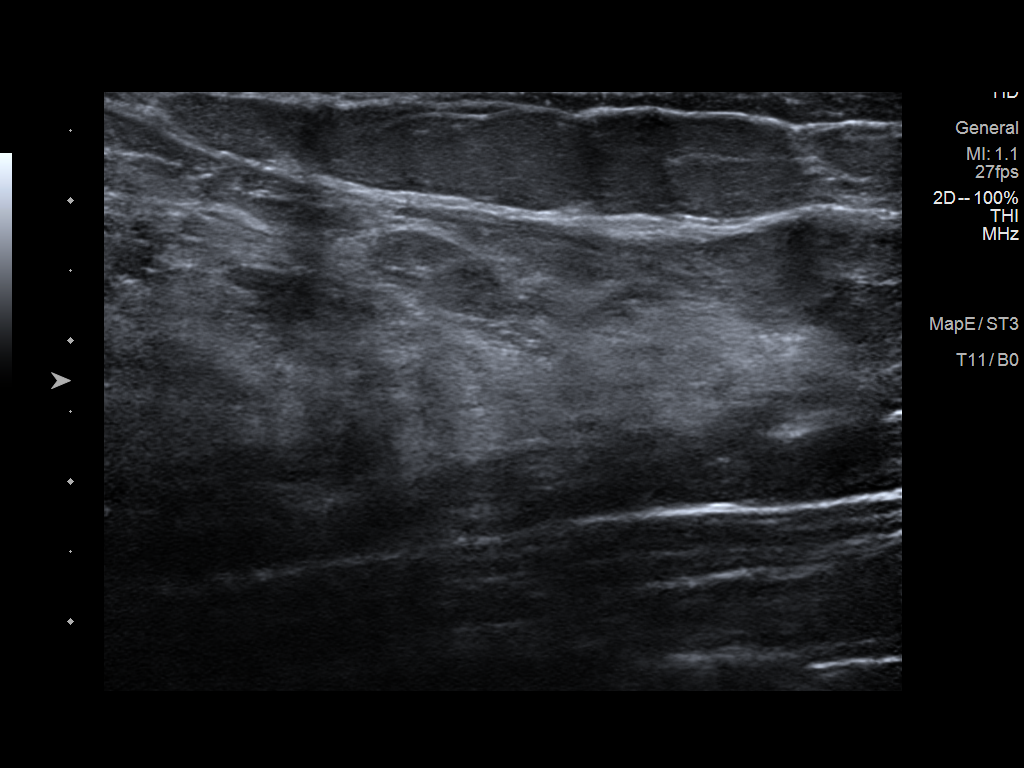

[3 of 3 positions shown; findings below may reference images not displayed]

ACR Breast Density Category b: There are scattered areas of
fibroglandular density.
FINDINGS: The possible asymmetry, noted in the posterior, slightly medial
aspect of the right breast, mostly disperses on spot compression
imaging, consistent with superimposed fibroglandular tissue. There
is no underlying mass. There is no architectural distortion. There
are no suspicious calcifications.

Mammographic images were processed with CAD.

Targeted ultrasound is performed, showing normal tissue throughout
the medial right breast. No mass or suspicious lesion.
IMPRESSION: 1. No evidence of breast malignancy.

RECOMMENDATION:
Screening mammogram in one year.(Code:[id number])

I have discussed the findings and recommendations with the patient.
Results were also provided in writing at the conclusion of the
visit. If applicable, a reminder letter will be sent to the patient
regarding the next appointment.

BI-RADS CATEGORY  1: Negative.

## 2020-01-11 NOTE — Progress Notes (Signed)
Cardiology Office Note:    Date:  01/13/2020   ID:  Ashley Archer, DOB [birth year], MRN [id number]  PCP:  Ashley Albee, MD  Cardiologist:  No primary care provider on file.  Electrophysiologist:  None   Referring MD: Ashley Albee, MD   Chief Complaint  Patient presents with  . Chest Pain    History of Present Illness:    Ashley Archer is a 49 y.o. female with a hx of GERD he was referred by Dr. Anastasio Archer for evaluation of chest pain.  Reports had illness last A[place], thinks she had Covid but was not tested at that time.  Reports she became ill again around Thanksgiving time, lasted until after Christmas.  Had bronchitis.  States that she has felt short of breath since that time.  Couple weeks ago noted the onset of chest pain.  Describes cramping pain behind left breast.  Will come and go, last for about 10 minutes.  Can occur at rest or with exertion.  Was occurring a couple times per week, but has not had an episode in the last 10 days.  Also reports intermittent palpitations where she feels like her heart is fluttering.  Occurs a few times per month, last a few seconds and resolve.  She exercises by using exercise bike about once per week, also does weight bench couple times per week.  Usually works out for 20 to 30 minutes.  She denies any smoking history.  No family history of heart disease.   Past Medical History:  Diagnosis Date  . Allergy   . Angioedema 02/11/2018   to cefdinir  . CIN II (cervical intraepithelial neoplasia II)   . Fibroid   . GERD (gastroesophageal reflux disease)   . Perimenopause   . Urticaria   . Vitamin D deficiency disease     Past Surgical History:  Procedure Laterality Date  . CERVICAL BIOPSY  W/ LOOP ELECTRODE EXCISION  2001   CIN ll  . DILATION AND CURETTAGE OF UTERUS    . Thumb Surg      Current Medications: Current Meds  Medication Sig  . albuterol (VENTOLIN HFA) 108 (90 Base) MCG/ACT inhaler Inhale 2 puffs into the lungs  every 4 (four) hours as needed for wheezing or shortness of breath.  . Ascorbic Acid (VITAMIN C WITH ROSE HIPS) 500 MG tablet Take 500 mg by mouth daily.  . Cholecalciferol (VITAMIN D-3) 125 MCG (5000 UT) TABS Take 10,000 Units by mouth daily.   Marland Kitchen EPINEPHRINE 0.3 mg/0.3 mL IJ SOAJ injection INJECT 0.3 MLS INTO THE MUSCLE ONCE AS NEEDED FOR 1 DOSE (SEVERE ALLERGIC REACTION)  . fluticasone (FLONASE) 50 MCG/ACT nasal spray SPRAY 2 SPRAY INTO EACH NOSTRIL EVERY DAY  . loratadine (CLARITIN) 10 MG tablet Take 10 mg by mouth daily as needed for allergies.  . progesterone (PROMETRIUM) 200 MG capsule Take 1 capsule (200 mg total) by mouth every evening.  . Tafluprost, PF, (ZIOPTAN) 0.0015 % SOLN Place 1 drop into both eyes daily. Each eye   . vitamin E 400 UNIT capsule Take 400 Units by mouth daily.     Allergies:   Cefdinir, Benzonatate, Coconut oil, and Hydrocodone   Social History   Socioeconomic History  . Marital status: Married    Spouse name: Not on file  . Number of children: Not on file  . Years of education: Not on file  . Highest education level: Not on file  Occupational History  . Not on file  Tobacco Use  . Smoking status: Never Smoker  . Smokeless tobacco: Never Used  Substance and Sexual Activity  . Alcohol use: No  . Drug use: No  . Sexual activity: Yes    Birth control/protection: Condom  Other Topics Concern  . 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   Social Determinants of Health   Financial Resource Strain:   . Difficulty of Paying Living Expenses:   Food Insecurity:   . Worried About Charity fundraiser in the Last Year:   . Arboriculturist in the Last Year:   Transportation Needs:   . Film/video editor (Medical):   Marland Kitchen Lack of Transportation (Non-Medical):   Physical Activity:   . Days of Exercise per Week:   . Minutes of Exercise per Session:   Stress:   .  Feeling of Stress :   Social Connections:   . Frequency of Communication with Friends and Family:   . Frequency of Social Gatherings with Friends and Family:   . Attends Religious Services:   . Active Member of Clubs or Organizations:   . Attends Archivist Meetings:   Marland Kitchen Marital Status:      Family History: The patient's family history includes Asthma in her son; Breast cancer in her paternal grandmother; Cancer (age of onset: 20) in her father; Cancer (age of onset: 77) in her mother; Diabetes in her maternal aunt and maternal grandmother; Hypertension in her mother; Pancreatic cancer in her father.  ROS:   Please see the history of present illness.     All other systems reviewed and are negative.  EKGs/Labs/Other Studies Reviewed:    The following studies were reviewed today:   EKG:  EKG is  ordered today.  The ekg ordered today demonstrates normal sinus rhythm, rate 64, no ST/T abnormalities  Recent Labs: 07/15/2019: ALT 14; BUN 21; Creat 0.93; Hemoglobin 14.3; Platelets 206; Potassium 4.1; Sodium 138; TSH 0.70  Recent Lipid Panel    Component Value Date/Time   CHOL 197 07/15/2019 1526   TRIG 116 07/15/2019 1526   HDL 71 07/15/2019 1526   CHOLHDL 2.8 07/15/2019 1526   LDLCALC 105 (H) 07/15/2019 1526    Physical Exam:    VS:  BP 115/78   Pulse 64   Temp (!) 97 F (36.1 C)   Ht 5\' 2"  (1.575 m)   Wt 170 lb (77.1 kg)   SpO2 98%   BMI 31.09 kg/m     Wt Readings from Last 3 Encounters:  01/13/20 170 lb (77.1 kg)  12/30/19 172 lb (78 kg)  12/25/19 170 lb (77.1 kg)     GEN:  Well nourished, well developed in no acute distress HEENT: Normal NECK: No JVD LYMPHATICS: No lymphadenopathy CARDIAC: RRR, 2 out of 6 systolic murmur RESPIRATORY:  Clear to auscultation without rales, wheezing or rhonchi  ABDOMEN: Soft, non-tender, non-distended MUSCULOSKELETAL:  No edema; No deformity  SKIN: Warm and dry NEUROLOGIC:  Alert and oriented x 3 PSYCHIATRIC:   Normal affect   ASSESSMENT:    1. Chest pain of uncertain etiology   2. DOE (dyspnea on exertion)   3. Palpitations    PLAN:     Chest pain: Atypical in description.  Given lack of risk factors, would classify as low risk for obstructive CAD.  Normal resting EKG and she is able to exercise.  Will check ETT to rule out ischemia  Dyspnea:  Given suspected prior COVID-19 infection and persistent DOE, will check TTE to evaluate for evidence of myocardial involvement  Palpitations: Infrequent palpitations lasting few seconds, suspect PACs/PVCs.  Will consider monitor if worsening symptoms  RTC in 1 month   Medication Adjustments/Labs and Tests Ordered: Current medicines are reviewed at length with the patient today.  Concerns regarding medicines are outlined above.  Orders Placed This Encounter  Procedures  . EXERCISE TOLERANCE TEST (ETT)  . EKG 12-Lead  . ECHOCARDIOGRAM COMPLETE   No orders of the defined types were placed in this encounter.   Patient Instructions  Medication Instructions:  Your physician recommends that you continue on your current medications as directed. Please refer to the Current Medication list given to you today.  Testing/Procedures: Your physician has requested that you have an echocardiogram. Echocardiography is a painless test that uses sound waves to create images of your heart. It provides your doctor with information about the size and shape of your heart and how well your heart's chambers and valves are working. This procedure takes approximately one hour. There are no restrictions for this procedure. This will be done at our [facility] location:  [place] has requested that you have an exercise tolerance test. For further information please visit [email or web address]. Please also follow instruction sheet, as given. --you will need a covid test 3 days prior-we will schedule this for you.   Follow-Up: At [facility], you and your health needs are our priority.  As part of our continuing mission to provide you with exceptional heart care, we have created designated Provider Care Teams.  These Care Teams include your primary Cardiologist (physician) and Advanced Practice Providers (APPs -  Physician Assistants and Nurse Practitioners) who all work together to provide you with the care you need, when you need it.  We recommend signing up for the patient portal called "MyChart".  Sign up information is provided on this After Visit Summary.  MyChart is used to connect with patients for Virtual Visits (Telemedicine).  Patients are able to view lab/test results, encounter notes, upcoming appointments, etc.  Non-urgent messages can be sent to your provider as well.   To learn more about what you can do with MyChart, go to [email or web address].    Your next appointment:   1 month(s)  The format for your next appointment:   Either In Person or Virtual  Provider:   Oswaldo Milian, MD         Signed, Donato Heinz, MD  01/13/2020 5:39 PM    [place]

## 2020-01-13 ENCOUNTER — Ambulatory Visit: Payer: 59 | Admitting: Cardiology

## 2020-01-13 ENCOUNTER — Other Ambulatory Visit: Payer: Self-pay

## 2020-01-13 ENCOUNTER — Encounter: Payer: Self-pay | Admitting: Cardiology

## 2020-01-13 VITALS — BP 115/78 | HR 64 | Temp 97.0°F | Ht 62.0 in | Wt 170.0 lb

## 2020-01-13 DIAGNOSIS — R0600 Dyspnea, unspecified: Secondary | ICD-10-CM | POA: Diagnosis not present

## 2020-01-13 DIAGNOSIS — R079 Chest pain, unspecified: Secondary | ICD-10-CM | POA: Diagnosis not present

## 2020-01-13 DIAGNOSIS — R0609 Other forms of dyspnea: Secondary | ICD-10-CM

## 2020-01-13 DIAGNOSIS — R002 Palpitations: Secondary | ICD-10-CM

## 2020-01-13 NOTE — Patient Instructions (Signed)
Medication Instructions:  Your physician recommends that you continue on your current medications as directed. Please refer to the Current Medication list given to you today.  Testing/Procedures: Your physician has requested that you have an echocardiogram. Echocardiography is a painless test that uses sound waves to create images of your heart. It provides your doctor with information about the size and shape of your heart and how well your heart's chambers and valves are working. This procedure takes approximately one hour. There are no restrictions for this procedure. This will be done at our [facility] location:  [place] has requested that you have an exercise tolerance test. For further information please visit [email or web address]. Please also follow instruction sheet, as given. --you will need a covid test 3 days prior-we will schedule this for you.   Follow-Up: At [facility], you and your health needs are our priority.  As part of our continuing mission to provide you with exceptional heart care, we have created designated Provider Care Teams.  These Care Teams include your primary Cardiologist (physician) and Advanced Practice Providers (APPs -  Physician Assistants and Nurse Practitioners) who all work together to provide you with the care you need, when you need it.  We recommend signing up for the patient portal called "MyChart".  Sign up information is provided on this After Visit Summary.  MyChart is used to connect with patients for Virtual Visits (Telemedicine).  Patients are able to view lab/test results, encounter notes, upcoming appointments, etc.  Non-urgent messages can be sent to your provider as well.   To learn more about what you can do with MyChart, go to [email or web address].    Your next appointment:   1 month(s)  The format for your next appointment:   Either In Person or Virtual  Provider:   Oswaldo Milian,  MD

## 2020-01-26 ENCOUNTER — Other Ambulatory Visit: Payer: Self-pay

## 2020-01-26 ENCOUNTER — Ambulatory Visit (HOSPITAL_COMMUNITY): Payer: 59 | Attending: Internal Medicine

## 2020-01-26 DIAGNOSIS — R079 Chest pain, unspecified: Secondary | ICD-10-CM | POA: Diagnosis not present

## 2020-01-28 DIAGNOSIS — H40053 Ocular hypertension, bilateral: Secondary | ICD-10-CM | POA: Diagnosis not present

## 2020-01-28 DIAGNOSIS — H1131 Conjunctival hemorrhage, right eye: Secondary | ICD-10-CM | POA: Diagnosis not present

## 2020-02-05 ENCOUNTER — Inpatient Hospital Stay (HOSPITAL_COMMUNITY): Admission: RE | Admit: 2020-02-05 | Payer: 59 | Source: Ambulatory Visit

## 2020-02-09 ENCOUNTER — Telehealth (HOSPITAL_COMMUNITY): Payer: Self-pay

## 2020-02-09 NOTE — Telephone Encounter (Signed)
Encounter complete. 

## 2020-02-11 ENCOUNTER — Encounter (HOSPITAL_COMMUNITY): Payer: 59

## 2020-02-15 DIAGNOSIS — Z6831 Body mass index (BMI) 31.0-31.9, adult: Secondary | ICD-10-CM | POA: Diagnosis not present

## 2020-02-15 DIAGNOSIS — Z01419 Encounter for gynecological examination (general) (routine) without abnormal findings: Secondary | ICD-10-CM | POA: Diagnosis not present

## 2020-02-15 DIAGNOSIS — R69 Illness, unspecified: Secondary | ICD-10-CM | POA: Diagnosis not present

## 2020-02-16 ENCOUNTER — Telehealth: Payer: 59 | Admitting: Cardiology

## 2020-02-16 ENCOUNTER — Telehealth (INDEPENDENT_AMBULATORY_CARE_PROVIDER_SITE_OTHER): Payer: 59 | Admitting: Cardiology

## 2020-02-16 VITALS — BP 125/89 | HR 81 | Ht 62.0 in | Wt 168.0 lb

## 2020-02-16 DIAGNOSIS — R0600 Dyspnea, unspecified: Secondary | ICD-10-CM

## 2020-02-16 DIAGNOSIS — R030 Elevated blood-pressure reading, without diagnosis of hypertension: Secondary | ICD-10-CM | POA: Diagnosis not present

## 2020-02-16 DIAGNOSIS — R079 Chest pain, unspecified: Secondary | ICD-10-CM | POA: Diagnosis not present

## 2020-02-16 DIAGNOSIS — R002 Palpitations: Secondary | ICD-10-CM | POA: Diagnosis not present

## 2020-02-16 DIAGNOSIS — R0609 Other forms of dyspnea: Secondary | ICD-10-CM

## 2020-02-16 NOTE — Patient Instructions (Signed)
Medication Instructions:  Your physician recommends that you continue on your current medications as directed. Please refer to the Current Medication list given to you today.  Follow-Up: At [facility], you and your health needs are our priority.  As part of our continuing mission to provide you with exceptional heart care, we have created designated Provider Care Teams.  These Care Teams include your primary Cardiologist (physician) and Advanced Practice Providers (APPs -  Physician Assistants and Nurse Practitioners) who all work together to provide you with the care you need, when you need it.  We recommend signing up for the patient portal called "MyChart".  Sign up information is provided on this After Visit Summary.  MyChart is used to connect with patients for Virtual Visits (Telemedicine).  Patients are able to view lab/test results, encounter notes, upcoming appointments, etc.  Non-urgent messages can be sent to your provider as well.   To learn more about what you can do with MyChart, go to [email or web address].    Your next appointment:   12 month(s)  The format for your next appointment:   In Person  Provider:   Oswaldo Milian, MD   Other Instructions Please check your blood pressure at home daily, write it down.  Call the office of send message via Mychart with the readings in 2 weeks for Dr. Gardiner Rhyme to review.

## 2020-02-16 NOTE — Progress Notes (Signed)
Virtual Visit via Telephone Note   This visit type was conducted due to national recommendations for restrictions regarding the COVID-19 Pandemic (e.g. social distancing) in an effort to limit this patient's exposure and mitigate transmission in our community.  Due to her co-morbid illnesses, this patient is at least at moderate risk for complications without adequate follow up.  This format is felt to be most appropriate for this patient at this time.  The patient did not have access to video technology/had technical difficulties with video requiring transitioning to audio format only (telephone).  All issues noted in this document were discussed and addressed.  No physical exam could be performed with this format.  Please refer to the patient's chart for her  consent to telehealth for [facility].   Date:  02/16/2020   ID:  Ashley Archer, DOB 12-Oct-1970, MRN [id number]  Patient Location: Home Provider Location: Office  PCP:  Doree Albee, MD  Cardiologist:  No primary care provider on file.  Electrophysiologist:  None   Evaluation Performed:  Follow-Up Visit  Chief Complaint:  Chest pain  History of Present Illness:    Ashley Archer is a 49 y.o. female with a hx of GERD he presents for follow-up.  She was referred by Dr. Anastasio Champion for evaluation of chest pain, initially seen on 01/13/2020. Reports had illness last Jareli, thinks she had Covid but was not tested at that time.  Reports she became ill again around Thanksgiving time, lasted until after Christmas.  Had bronchitis.  States that she has felt short of breath since that time.  Couple weeks prior to initial clinic visit noted the onset of chest pain.  Describes cramping pain behind left breast.  Will come and go, last for about 10 minutes.  Can occur at rest or with exertion.  Was occurring a couple times per week, but has not had an episode in the last 10 days.  Also reports intermittent palpitations where she feels like her  heart is fluttering.  Occurs a few times per month, last a few seconds and resolve.  She exercises by using exercise bike about once per week, also does weight bench couple times per week.  Usually works out for 20 to 30 minutes.  She denies any smoking history.  No family history of heart disease.  TTE on 01/26/2020 showed normal biventricular function, no significant valvular disease.  ETT was ordered but has not been done, as patient was unable to quarantine for 3 days prior to study.  Since last clinic visit, she reports that she has been doing well.  She denies any further chest pain.  Does report that she has some indigestion but resolves with simethicone.  She reports her dyspnea has resolved.  States that she has been working out about 4 times per week with weight bench or bicycle.  Denies any exertional chest pain.  Denies any lightheadedness or syncope.  Does report rare palpitations, last few seconds and resolves.   Past Medical History:  Diagnosis Date  . Allergy   . Angioedema 02/11/2018   to cefdinir  . CIN II (cervical intraepithelial neoplasia II)   . Fibroid   . GERD (gastroesophageal reflux disease)   . Perimenopause   . Urticaria   . Vitamin D deficiency disease    Past Surgical History:  Procedure Laterality Date  . CERVICAL BIOPSY  W/ LOOP ELECTRODE EXCISION  2001   CIN ll  . DILATION AND CURETTAGE OF UTERUS    .  Thumb Surg       Current Meds  Medication Sig  . albuterol (VENTOLIN HFA) 108 (90 Base) MCG/ACT inhaler Inhale 2 puffs into the lungs every 4 (four) hours as needed for wheezing or shortness of breath.  . Ascorbic Acid (VITAMIN C WITH ROSE HIPS) 500 MG tablet Take 500 mg by mouth daily.  . Cholecalciferol (VITAMIN D-3) 125 MCG (5000 UT) TABS Take 10,000 Units by mouth daily.   Marland Kitchen EPINEPHRINE 0.3 mg/0.3 mL IJ SOAJ injection INJECT 0.3 MLS INTO THE MUSCLE ONCE AS NEEDED FOR 1 DOSE (SEVERE ALLERGIC REACTION)  . fluticasone (FLONASE) 50 MCG/ACT nasal spray  SPRAY 2 SPRAY INTO EACH NOSTRIL EVERY DAY  . loratadine (CLARITIN) 10 MG tablet Take 10 mg by mouth daily as needed for allergies.  . progesterone (PROMETRIUM) 200 MG capsule Take 1 capsule (200 mg total) by mouth every evening.  . Tafluprost, PF, (ZIOPTAN) 0.0015 % SOLN Place 1 drop into both eyes daily. Each eye   . vitamin E 400 UNIT capsule Take 400 Units by mouth daily.     Allergies:   Cefdinir, Benzonatate, Coconut oil, and Hydrocodone   Social History   Tobacco Use  . Smoking status: Never Smoker  . Smokeless tobacco: Never Used  Substance Use Topics  . Alcohol use: No  . Drug use: No     Family Hx: The patient's family history includes Asthma in her son; Breast cancer in her paternal grandmother; Cancer (age of onset: 11) in her father; Cancer (age of onset: 61) in her mother; Diabetes in her maternal aunt and maternal grandmother; Hypertension in her mother; Pancreatic cancer in her father.  ROS:   Please see the history of present illness.     All other systems reviewed and are negative.   Prior CV studies:   The following studies were reviewed today:    Labs/Other Tests and Data Reviewed:    EKG:  No ECG reviewed.  Recent Labs: 07/15/2019: ALT 14; BUN 21; Creat 0.93; Hemoglobin 14.3; Platelets 206; Potassium 4.1; Sodium 138; TSH 0.70   Recent Lipid Panel Lab Results  Component Value Date/Time   CHOL 197 07/15/2019 03:26 PM   TRIG 116 07/15/2019 03:26 PM   HDL 71 07/15/2019 03:26 PM   CHOLHDL 2.8 07/15/2019 03:26 PM   LDLCALC 105 (H) 07/15/2019 03:26 PM    Wt Readings from Last 3 Encounters:  02/16/20 168 lb (76.2 kg)  01/13/20 170 lb (77.1 kg)  12/30/19 172 lb (78 kg)     Objective:    Vital Signs:  BP 125/89   Pulse 81   Ht 5\' 2"  (1.575 m)   Wt 168 lb (76.2 kg)   BMI 30.73 kg/m    VITAL SIGNS:  reviewed  Gen: No respiratory distress, converses comfortably  ASSESSMENT & PLAN:    Chest pain: Atypical in description.  Given lack of risk  factors, would classify as low risk for obstructive CAD.    ETT was ordered but was not done as patient was unable to quarantine for 3 days prior to procedure.  Given she is now working out 4 times per week without any exertional chest pain, can hold off on ETT for now.  Dyspnea: Given suspected prior COVID-19 infection and persistent DOE,  TTE was ordered.  TTE shows no structural heart disease  Palpitations: Infrequent palpitations lasting few seconds, suspect PACs/PVCs.  Will consider monitor if worsening symptoms  BP elevation: Borderline elevated BP reported today.  States that BP is  normally 120s over 70s.  Notably, did have mild LVH on recent TTE.  Asked patient to monitor blood pressure daily for next 2 weeks and call with results.  RTC in 1 year  Time:   Today, I have spent 10 minutes with the patient with telehealth technology discussing the above problems.     Medication Adjustments/Labs and Tests Ordered: Current medicines are reviewed at length with the patient today.  Concerns regarding medicines are outlined above.   Tests Ordered: No orders of the defined types were placed in this encounter.   Medication Changes: No orders of the defined types were placed in this encounter.   Follow Up:  In Person in 1 year(s)  Signed, Donato Heinz, MD  02/16/2020 2:32 PM    [facility]

## 2020-03-07 ENCOUNTER — Other Ambulatory Visit (INDEPENDENT_AMBULATORY_CARE_PROVIDER_SITE_OTHER): Payer: Self-pay | Admitting: Nurse Practitioner

## 2020-03-07 DIAGNOSIS — N951 Menopausal and female climacteric states: Secondary | ICD-10-CM

## 2020-03-24 DIAGNOSIS — H401131 Primary open-angle glaucoma, bilateral, mild stage: Secondary | ICD-10-CM | POA: Diagnosis not present

## 2020-03-24 DIAGNOSIS — H1131 Conjunctival hemorrhage, right eye: Secondary | ICD-10-CM | POA: Diagnosis not present

## 2020-03-24 DIAGNOSIS — H40053 Ocular hypertension, bilateral: Secondary | ICD-10-CM | POA: Diagnosis not present

## 2020-03-30 ENCOUNTER — Other Ambulatory Visit (INDEPENDENT_AMBULATORY_CARE_PROVIDER_SITE_OTHER): Payer: Self-pay | Admitting: Nurse Practitioner

## 2020-03-30 ENCOUNTER — Encounter (INDEPENDENT_AMBULATORY_CARE_PROVIDER_SITE_OTHER): Payer: Self-pay | Admitting: Internal Medicine

## 2020-03-30 DIAGNOSIS — N951 Menopausal and female climacteric states: Secondary | ICD-10-CM

## 2020-03-31 DIAGNOSIS — H43393 Other vitreous opacities, bilateral: Secondary | ICD-10-CM | POA: Diagnosis not present

## 2020-03-31 DIAGNOSIS — H538 Other visual disturbances: Secondary | ICD-10-CM | POA: Diagnosis not present

## 2020-03-31 DIAGNOSIS — H401131 Primary open-angle glaucoma, bilateral, mild stage: Secondary | ICD-10-CM | POA: Diagnosis not present

## 2020-03-31 DIAGNOSIS — T50905A Adverse effect of unspecified drugs, medicaments and biological substances, initial encounter: Secondary | ICD-10-CM | POA: Diagnosis not present

## 2020-04-05 ENCOUNTER — Ambulatory Visit (INDEPENDENT_AMBULATORY_CARE_PROVIDER_SITE_OTHER): Payer: 59 | Admitting: Nurse Practitioner

## 2020-04-18 DIAGNOSIS — Z23 Encounter for immunization: Secondary | ICD-10-CM | POA: Diagnosis not present

## 2020-04-19 DIAGNOSIS — H40053 Ocular hypertension, bilateral: Secondary | ICD-10-CM | POA: Diagnosis not present

## 2020-04-19 DIAGNOSIS — H538 Other visual disturbances: Secondary | ICD-10-CM | POA: Diagnosis not present

## 2020-04-19 DIAGNOSIS — H401131 Primary open-angle glaucoma, bilateral, mild stage: Secondary | ICD-10-CM | POA: Diagnosis not present

## 2020-06-07 ENCOUNTER — Other Ambulatory Visit: Payer: Self-pay | Admitting: Obstetrics and Gynecology

## 2020-06-07 DIAGNOSIS — Z0000 Encounter for general adult medical examination without abnormal findings: Secondary | ICD-10-CM

## 2020-06-09 DIAGNOSIS — H401134 Primary open-angle glaucoma, bilateral, indeterminate stage: Secondary | ICD-10-CM | POA: Diagnosis not present

## 2020-06-11 DIAGNOSIS — Z20828 Contact with and (suspected) exposure to other viral communicable diseases: Secondary | ICD-10-CM | POA: Diagnosis not present

## 2020-06-23 ENCOUNTER — Ambulatory Visit
Admission: RE | Admit: 2020-06-23 | Discharge: 2020-06-23 | Disposition: A | Discharge status: Home / Self Care | Payer: Self-pay | Source: Ambulatory Visit | Attending: Obstetrics and Gynecology | Admitting: Obstetrics and Gynecology

## 2020-06-23 ENCOUNTER — Other Ambulatory Visit: Payer: Self-pay

## 2020-06-23 DIAGNOSIS — Z0000 Encounter for general adult medical examination without abnormal findings: Secondary | ICD-10-CM

## 2020-06-23 DIAGNOSIS — Z1231 Encounter for screening mammogram for malignant neoplasm of breast: Secondary | ICD-10-CM | POA: Diagnosis not present

## 2020-06-23 DIAGNOSIS — Z03818 Encounter for observation for suspected exposure to other biological agents ruled out: Secondary | ICD-10-CM | POA: Diagnosis not present

## 2020-06-30 ENCOUNTER — Encounter (INDEPENDENT_AMBULATORY_CARE_PROVIDER_SITE_OTHER): Payer: Self-pay | Admitting: Internal Medicine

## 2020-06-30 DIAGNOSIS — Z03818 Encounter for observation for suspected exposure to other biological agents ruled out: Secondary | ICD-10-CM | POA: Diagnosis not present

## 2020-07-06 DIAGNOSIS — Z20828 Contact with and (suspected) exposure to other viral communicable diseases: Secondary | ICD-10-CM | POA: Diagnosis not present

## 2020-07-19 ENCOUNTER — Encounter (INDEPENDENT_AMBULATORY_CARE_PROVIDER_SITE_OTHER): Payer: 59 | Admitting: Internal Medicine

## 2020-07-25 DIAGNOSIS — H04123 Dry eye syndrome of bilateral lacrimal glands: Secondary | ICD-10-CM | POA: Diagnosis not present

## 2020-07-25 DIAGNOSIS — H401134 Primary open-angle glaucoma, bilateral, indeterminate stage: Secondary | ICD-10-CM | POA: Diagnosis not present

## 2020-07-25 DIAGNOSIS — H25813 Combined forms of age-related cataract, bilateral: Secondary | ICD-10-CM | POA: Diagnosis not present

## 2020-09-27 DIAGNOSIS — Z23 Encounter for immunization: Secondary | ICD-10-CM | POA: Diagnosis not present

## 2020-10-13 ENCOUNTER — Other Ambulatory Visit (INDEPENDENT_AMBULATORY_CARE_PROVIDER_SITE_OTHER): Payer: Self-pay | Admitting: Internal Medicine

## 2020-10-13 DIAGNOSIS — N951 Menopausal and female climacteric states: Secondary | ICD-10-CM

## 2020-10-18 DIAGNOSIS — Z1152 Encounter for screening for COVID-19: Secondary | ICD-10-CM | POA: Diagnosis not present

## 2020-10-19 DIAGNOSIS — Z03818 Encounter for observation for suspected exposure to other biological agents ruled out: Secondary | ICD-10-CM | POA: Diagnosis not present

## 2020-10-19 DIAGNOSIS — Z20822 Contact with and (suspected) exposure to covid-19: Secondary | ICD-10-CM | POA: Diagnosis not present

## 2020-10-27 ENCOUNTER — Other Ambulatory Visit (INDEPENDENT_AMBULATORY_CARE_PROVIDER_SITE_OTHER): Payer: Self-pay | Admitting: Internal Medicine

## 2020-10-27 DIAGNOSIS — N951 Menopausal and female climacteric states: Secondary | ICD-10-CM

## 2020-10-31 ENCOUNTER — Encounter (INDEPENDENT_AMBULATORY_CARE_PROVIDER_SITE_OTHER): Payer: Self-pay | Admitting: Internal Medicine

## 2020-11-03 ENCOUNTER — Other Ambulatory Visit (INDEPENDENT_AMBULATORY_CARE_PROVIDER_SITE_OTHER): Payer: Self-pay | Admitting: Internal Medicine

## 2020-11-03 ENCOUNTER — Telehealth (INDEPENDENT_AMBULATORY_CARE_PROVIDER_SITE_OTHER): Payer: Self-pay | Admitting: Internal Medicine

## 2020-11-03 DIAGNOSIS — N951 Menopausal and female climacteric states: Secondary | ICD-10-CM

## 2020-11-03 MED ORDER — PROGESTERONE 200 MG PO CAPS: 200.0000 mg | ORAL_CAPSULE | Freq: Every evening | ORAL | 1 refills | Status: AC

## 2020-11-03 NOTE — Telephone Encounter (Signed)
Okay, please let her know that I have sent a 90-day supply to the [facility] pharmacy that she requested.

## 2020-11-15 ENCOUNTER — Other Ambulatory Visit: Payer: Self-pay

## 2020-11-15 ENCOUNTER — Ambulatory Visit (INDEPENDENT_AMBULATORY_CARE_PROVIDER_SITE_OTHER): Payer: 59 | Admitting: Internal Medicine

## 2020-11-15 ENCOUNTER — Encounter (INDEPENDENT_AMBULATORY_CARE_PROVIDER_SITE_OTHER): Payer: Self-pay | Admitting: Internal Medicine

## 2020-11-15 VITALS — BP 122/88 | HR 64 | Ht 62.0 in | Wt 172.6 lb

## 2020-11-15 DIAGNOSIS — E785 Hyperlipidemia, unspecified: Secondary | ICD-10-CM

## 2020-11-15 DIAGNOSIS — N951 Menopausal and female climacteric states: Secondary | ICD-10-CM

## 2020-11-15 DIAGNOSIS — E559 Vitamin D deficiency, unspecified: Secondary | ICD-10-CM | POA: Diagnosis not present

## 2020-11-15 NOTE — Progress Notes (Signed)
Metrics: Intervention Frequency ACO  Documented Smoking Status Yearly  Screened one or more times in 24 months  Cessation Counseling or  Active cessation medication Past 24 months  Past 24 months   Guideline developer: UpToDate (See UpToDate for funding source) Date Released: 2014       Wellness Office Visit  Subjective:  Patient ID: Ashley Archer, female    DOB: 1971/04/26  Age: 50 y.o. MRN: [id number]  CC: This lady comes in for follow-up regarding her perimenopausal symptoms, dyslipidemia and vitamin D deficiency. HPI  The last time I saw her was March 2021 and it has been a long time.  She certainly feels that the progesterone does seem to help her perimenopausal symptoms.  She continues to have cycles although somewhat irregular. She continues to take vitamin D3 supplementation for vitamin D deficiency. She has not been consistent with exercise and nutrition is somewhat variable. Past Medical History:  Diagnosis Date  . Allergy   . Angioedema 02/11/2018   to cefdinir  . CIN II (cervical intraepithelial neoplasia II)   . Fibroid   . GERD (gastroesophageal reflux disease)   . Perimenopause   . Urticaria   . Vitamin D deficiency disease    Past Surgical History:  Procedure Laterality Date  . CERVICAL BIOPSY  W/ LOOP ELECTRODE EXCISION  2001   CIN ll  . DILATION AND CURETTAGE OF UTERUS    . Thumb Surg       Family History  Problem Relation Age of Onset  . Hypertension Mother   . Cancer Mother 47       cervical  . Pancreatic cancer Father   . Cancer Father 17       pancreatic  . Diabetes Maternal Aunt   . Diabetes Maternal Grandmother   . Breast cancer Paternal Grandmother        under 46  . 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   Social History   Tobacco Use  . Smoking status: Never Smoker  . Smokeless tobacco: Never Used  Substance  Use Topics  . Alcohol use: No    Current Meds  Medication Sig  . albuterol (VENTOLIN HFA) 108 (90 Base) MCG/ACT inhaler Inhale 2 puffs into the lungs every 4 (four) hours as needed for wheezing or shortness of breath.  . Ascorbic Acid (VITAMIN C WITH ROSE HIPS) 500 MG tablet Take 500 mg by mouth daily.  . Cholecalciferol (VITAMIN D-3) 125 MCG (5000 UT) TABS Take 10,000 Units by mouth daily.   Marland Kitchen EPINEPHRINE 0.3 mg/0.3 mL IJ SOAJ injection INJECT 0.3 MLS INTO THE MUSCLE ONCE AS NEEDED FOR 1 DOSE (SEVERE ALLERGIC REACTION)  . fluticasone (FLONASE) 50 MCG/ACT nasal spray SPRAY 2 SPRAY INTO EACH NOSTRIL EVERY DAY  . loratadine (CLARITIN) 10 MG tablet Take 10 mg by mouth daily as needed for allergies.  . progesterone (PROMETRIUM) 200 MG capsule Take 1 capsule (200 mg total) by mouth at bedtime.  . Tafluprost, PF, (ZIOPTAN) 0.0015 % SOLN Place 1 drop into both eyes daily. Each eye  . vitamin E 400 UNIT capsule Take 400 Units by mouth daily.       Objective:   Today's Vitals: BP 122/88   Pulse 64   Ht 5\' 2"  (1.575 m)   Wt 172 lb 9.6 oz (78.3 kg)   BMI 31.57 kg/m  Vitals with BMI 11/15/2020 02/16/2020 01/13/2020  Height  5\' 2"  5\' 2"  5\' 2"   Weight 172 lbs 10 oz 168 lbs 170 lbs  BMI 31.56 94.09 82.86  Systolic 751 982 429  Diastolic 88 89 78  Pulse 64 81 64     Physical Exam  She looks systemically well.  She has not lost any weight and remains obese. Alert and orientated without any focal logical signs.     Assessment   1. Perimenopause   2. Dyslipidemia   3. Vitamin D deficiency disease       Tests ordered Orders Placed This Encounter  Procedures  . COMPLETE METABOLIC PANEL WITH GFR  . Lipid panel  . VITAMIN D 25 Hydroxy (Vit-D Deficiency, Fractures)     Plan: 1. She will continue with progesterone as this does seem to help her symptoms. 2. She will continue with vitamin D3 supplementation and we will check levels today. 3. She did have elevated LDL cholesterol  and we will check a lipid panel today. 4. We discussed the importance of regular exercise as much as she can manage and of course being consistent with nutrition. 5. Follow-up in 6 months for an annual physical exam.   No orders of the defined types were placed in this encounter.   Doree Albee, MD

## 2020-11-16 LAB — LIPID PANEL
Cholesterol: 179 mg/dL (ref <200)
HDL: 82 mg/dL (ref >=50)
LDL Cholesterol (Calc): 83 mg/dL (calc)
Non-HDL Cholesterol (Calc): 97 mg/dL (calc) (ref <130)
Total CHOL/HDL Ratio: 2.2 (calc) (ref <5.0)
Triglycerides: 63 mg/dL (ref <150)

## 2020-11-16 LAB — COMPLETE METABOLIC PANEL WITH GFR
AG Ratio: 1.9 (calc) (ref 1.0–2.5)
ALT: 14 U/L (ref 6–29)
AST: 15 U/L (ref 10–35)
Albumin: 4.5 g/dL (ref 3.6–5.1)
Alkaline phosphatase (APISO): 47 U/L (ref 31–125)
BUN: 11 mg/dL (ref 7–25)
CO2: 28 mmol/L (ref 20–32)
Calcium: 9.6 mg/dL (ref 8.6–10.2)
Chloride: 103 mmol/L (ref 98–110)
Creat: 0.64 mg/dL (ref 0.50–1.10)
GFR, Est African American: 122 mL/min/{1.73_m2} (ref >=60)
GFR, Est Non African American: 106 mL/min/{1.73_m2} (ref >=60)
Globulin: 2.4 g/dL (calc) (ref 1.9–3.7)
Glucose, Bld: 94 mg/dL (ref 65–99)
Potassium: 4.2 mmol/L (ref 3.5–5.3)
Sodium: 137 mmol/L (ref 135–146)
Total Bilirubin: 0.5 mg/dL (ref 0.2–1.2)
Total Protein: 6.9 g/dL (ref 6.1–8.1)

## 2020-11-16 LAB — VITAMIN D 25 HYDROXY (VIT D DEFICIENCY, FRACTURES): Vit D, 25-Hydroxy: 66 ng/mL (ref 30–100)

## 2020-12-01 DIAGNOSIS — Z20822 Contact with and (suspected) exposure to covid-19: Secondary | ICD-10-CM | POA: Diagnosis not present

## 2020-12-01 DIAGNOSIS — Z03818 Encounter for observation for suspected exposure to other biological agents ruled out: Secondary | ICD-10-CM | POA: Diagnosis not present

## 2020-12-10 ENCOUNTER — Other Ambulatory Visit (INDEPENDENT_AMBULATORY_CARE_PROVIDER_SITE_OTHER): Payer: Self-pay | Admitting: Internal Medicine

## 2021-01-16 ENCOUNTER — Other Ambulatory Visit: Payer: Self-pay | Admitting: Obstetrics and Gynecology

## 2021-01-16 DIAGNOSIS — D250 Submucous leiomyoma of uterus: Secondary | ICD-10-CM

## 2021-01-18 ENCOUNTER — Ambulatory Visit
Admission: RE | Admit: 2021-01-18 | Discharge: 2021-01-18 | Disposition: A | Discharge status: Home / Self Care | Payer: 59 | Source: Ambulatory Visit | Attending: Obstetrics and Gynecology | Admitting: Obstetrics and Gynecology

## 2021-01-18 DIAGNOSIS — D250 Submucous leiomyoma of uterus: Secondary | ICD-10-CM

## 2021-01-18 HISTORY — PX: IR RADIOLOGIST EVAL & MGMT: IMG5224

## 2021-01-18 NOTE — Consult Note (Signed)
Chief Complaint: Patient was seen in consultation today for uterine fibroids and heavy menstrual bleeding at the request of [place]  Referring Physician(s): McComb,John  History of Present Illness: Ashley Archer is a 50 y.o. female with history of uterine fibroids and heavy menstrual bleeding.  Pregnancy history is G3, P2.  Patient had hysteroscopic resection of a intrauterine fibroid in 2019 for cramping.  She says that the resection did not significantly change her heavy menstrual bleeding.  She has had heavy menstrual bleeding for many years but it has gotten significantly worse in the last 3 months.  Her menstrual periods typically last 7 days with a very heavy flow on days 3 and 4.  During the heavy flow days, she can bleed through multiple pads and even her clothes.  It can be difficult to leave the house during the days of heavy bleeding.  No significant pain or cramping.  She does have urinary frequency and occasional constipation.  No history of significant pelvic inflammatory disease.  Patient is taking progesterone for perimenopausal symptoms.  She is using condoms for birth control and has no desire for future pregnancies.  Based on Dr. Sherran Needs office note, pelvic ultrasound demonstrates multiple uterine fibroids, largest measuring 7 x 9 cm.  Past Medical History:  Diagnosis Date  . Allergy   . Angioedema 02/11/2018   to cefdinir  . CIN II (cervical intraepithelial neoplasia II)   . Fibroid   . GERD (gastroesophageal reflux disease)   . Perimenopause   . Urticaria   . Vitamin D deficiency disease     Past Surgical History:  Procedure Laterality Date  . CERVICAL BIOPSY  W/ LOOP ELECTRODE EXCISION  2001   CIN ll  . DILATION AND CURETTAGE OF UTERUS    . Thumb Surg      Allergies: Cefdinir, Benzonatate, Coconut oil, and Hydrocodone  Medications: Prior to Admission medications   Medication Sig Start Date End Date Taking? Authorizing Provider  albuterol  (VENTOLIN HFA) 108 (90 Base) MCG/ACT inhaler Inhale 2 puffs into the lungs every 4 (four) hours as needed for wheezing or shortness of breath. 10/28/19   Julian Hy, DO  Ascorbic Acid (VITAMIN C WITH ROSE HIPS) 500 MG tablet Take 500 mg by mouth daily.    [provider]  Cholecalciferol (VITAMIN D-3) 125 MCG (5000 UT) TABS Take 10,000 Units by mouth daily.     [provider]  EPINEPHRINE 0.3 mg/0.3 mL IJ SOAJ injection INJECT 0.3 MLS INTO THE MUSCLE ONCE AS NEEDED FOR 1 DOSE (SEVERE ALLERGIC REACTION) 10/31/19   Gosrani, Nimish C, MD  fluticasone (FLONASE) 50 MCG/ACT nasal spray SPRAY 2 SPRAY INTO EACH NOSTRIL EVERY DAY 12/11/20   Hurshel Party C, MD  loratadine (CLARITIN) 10 MG tablet Take 10 mg by mouth daily as needed for allergies.    [provider]  progesterone (PROMETRIUM) 200 MG capsule Take 1 capsule (200 mg total) by mouth at bedtime. 11/03/20   Gosrani, Nimish C, MD  Tafluprost, PF, (ZIOPTAN) 0.0015 % SOLN Place 1 drop into both eyes daily. Each eye    [provider]  vitamin E 400 UNIT capsule Take 400 Units by mouth daily.    [provider]     Family History  Problem Relation Age of Onset  . Hypertension Mother   . Cancer Mother 42       cervical  . Pancreatic cancer Father   . Cancer Father 9  pancreatic  . Diabetes Maternal Aunt   . Diabetes Maternal Grandmother   . Breast cancer Paternal Grandmother        under 21  . Asthma Son     Social History   Socioeconomic History  . Marital status: Married    Spouse name: Not on file  . Number of children: Not on file  . Years of education: Not on file  . Highest education level: Not on file  Occupational History  . Not on file  Tobacco Use  . Smoking status: Never Smoker  . Smokeless tobacco: Never Used  Vaping Use  . Vaping Use: Never used  Substance and Sexual Activity  . Alcohol use: No  . Drug use: No  . Sexual activity: Yes    Birth control/protection:  Condom  Other Topics Concern  . 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   Social Determinants of Health   Financial Resource Strain: Not on file  Food Insecurity: Not on file  Transportation Needs: Not on file  Physical Activity: Not on file  Stress: Not on file  Social Connections: Not on file    Review of Systems  Gastrointestinal: Positive for constipation.  Genitourinary: Positive for frequency, menstrual problem and vaginal bleeding.    Vital Signs: There were no vitals taken for this visit.  Physical Exam Constitutional:      Appearance: Normal appearance.  Cardiovascular:     Rate and Rhythm: Normal rate and regular rhythm.     Pulses: Normal pulses.     Heart sounds: Normal heart sounds.  Pulmonary:     Effort: Pulmonary effort is normal.     Breath sounds: Normal breath sounds.  Abdominal:     General: Abdomen is flat. There is no distension.     Palpations: Abdomen is soft.     Tenderness: There is no abdominal tenderness.  Musculoskeletal:        General: No swelling.     Right lower leg: No edema.     Left lower leg: No edema.  Neurological:     Mental Status: She is alert.       Imaging: No results found.  Labs:  CBC: No results for input(s): WBC, HGB, HCT, PLT in the last 8760 hours.  COAGS: No results for input(s): INR, APTT in the last 8760 hours.  BMP: Recent Labs    11/15/20 1121  NA 137  K 4.2  CL 103  CO2 28  GLUCOSE 94  BUN 11  CALCIUM 9.6  CREATININE 0.64  GFRNONAA 106  GFRAA 122    LIVER FUNCTION TESTS: Recent Labs    11/15/20 1121  BILITOT 0.5  AST 15  ALT 14  PROT 6.9    TUMOR MARKERS: No results for input(s): AFPTM, CEA, CA199, CHROMGRNA in the last 8760 hours.  Assessment and Plan:  50 year old female with uterine fibroids and menorrhagia.  The menstrual bleeding has significantly increased in the past 3  months.  Patient desires treatment for the heavy menstrual bleeding and her fibroids.  Based on the patient's history and symptoms, it is likely that the fibroids are contributing to the heavy menstrual bleeding.  We discussed different treatment options including hormonal control, hysterectomy and uterine artery embolization.  We discussed the uterine artery embolization procedure in depth.  We discussed the procedure and the expected recovery following the procedure.  We discussed the risks of procedure which include but  not limited to bleeding, infection and vascular injury.  Patient is very interested in the uterine artery embolization procedure and she appears to be a good candidate for the procedure.  We will schedule the patient for a pelvic MRI, with and without contrast to further evaluate the fibroids.  In addition, I would like the patient have an endometrial biopsy since this heavy menstrual bleeding is new in the past 3 months.  We will schedule the patient for uterine artery embolization assuming there is no contraindication based on the MRI and the biopsy.  Thank you for this interesting consult.  I greatly enjoyed meeting Ashley Archer and look forward to participating in their care.  A copy of this report was sent to the requesting provider on this date.  Electronically Signed: Burman Riis 01/18/2021, 2:49 PM   I spent a total of  30 Minutes   in face to face in clinical consultation, greater than 50% of which was counseling/coordinating care for uterine fibroids and menorrhagia

## 2021-01-19 ENCOUNTER — Other Ambulatory Visit: Payer: Self-pay | Admitting: Diagnostic Radiology

## 2021-01-19 DIAGNOSIS — D250 Submucous leiomyoma of uterus: Secondary | ICD-10-CM

## 2021-02-06 ENCOUNTER — Other Ambulatory Visit: Payer: Self-pay

## 2021-02-06 DIAGNOSIS — D250 Submucous leiomyoma of uterus: Secondary | ICD-10-CM

## 2021-02-07 ENCOUNTER — Ambulatory Visit (HOSPITAL_COMMUNITY): Admission: RE | Admit: 2021-02-07 | Payer: Managed Care, Other (non HMO) | Source: Ambulatory Visit

## 2021-02-08 ENCOUNTER — Ambulatory Visit (HOSPITAL_COMMUNITY): Payer: Managed Care, Other (non HMO)

## 2021-02-15 ENCOUNTER — Encounter (HOSPITAL_COMMUNITY): Payer: Self-pay

## 2021-02-15 ENCOUNTER — Ambulatory Visit (HOSPITAL_COMMUNITY): Payer: Managed Care, Other (non HMO)

## 2021-02-17 ENCOUNTER — Other Ambulatory Visit: Payer: Self-pay | Admitting: Obstetrics and Gynecology

## 2021-02-17 DIAGNOSIS — E041 Nontoxic single thyroid nodule: Secondary | ICD-10-CM

## 2021-02-17 DIAGNOSIS — E049 Nontoxic goiter, unspecified: Secondary | ICD-10-CM

## 2021-02-21 ENCOUNTER — Other Ambulatory Visit: Payer: Self-pay | Admitting: Obstetrics and Gynecology

## 2021-02-21 DIAGNOSIS — N83202 Unspecified ovarian cyst, left side: Secondary | ICD-10-CM

## 2021-02-23 ENCOUNTER — Ambulatory Visit
Admission: RE | Admit: 2021-02-23 | Discharge: 2021-02-23 | Disposition: A | Discharge status: Home / Self Care | Payer: Managed Care, Other (non HMO) | Source: Ambulatory Visit | Attending: Obstetrics and Gynecology | Admitting: Obstetrics and Gynecology

## 2021-02-23 DIAGNOSIS — E049 Nontoxic goiter, unspecified: Secondary | ICD-10-CM

## 2021-03-01 ENCOUNTER — Other Ambulatory Visit: Payer: Self-pay

## 2021-03-01 ENCOUNTER — Ambulatory Visit: Payer: Managed Care, Other (non HMO) | Admitting: Physician Assistant

## 2021-03-01 ENCOUNTER — Encounter: Payer: Self-pay | Admitting: Physician Assistant

## 2021-03-01 VITALS — BP 134/86 | HR 56 | Ht 62.0 in | Wt 169.2 lb

## 2021-03-01 DIAGNOSIS — R0789 Other chest pain: Secondary | ICD-10-CM | POA: Diagnosis not present

## 2021-03-01 NOTE — Patient Instructions (Signed)
Medication Instructions:  Your physician recommends that you continue on your current medications as directed. Please refer to the Current Medication list given to you today.  *If you need a refill on your cardiac medications before your next appointment, please call your pharmacy*  Lab Work: NONE ordered at this time of appointment   If you have labs (blood work) drawn today and your tests are completely normal, you will receive your results only by: Marland Kitchen MyChart Message (if you have MyChart) OR . A paper copy in the mail If you have any lab test that is abnormal or we need to change your treatment, we will call you to review the results.  Testing/Procedures: NONE ordered at this time of appointment   Follow-Up: At [facility], you and your health needs are our priority.  As part of our continuing mission to provide you with exceptional heart care, we have created designated Provider Care Teams.  These Care Teams include your primary Cardiologist (physician) and Advanced Practice Providers (APPs -  Physician Assistants and Nurse Practitioners) who all work together to provide you with the care you need, when you need it.   Your next appointment:   12 month(s)  The format for your next appointment:   In Person  Provider:   Oswaldo Milian, MD  Other Instructions

## 2021-03-01 NOTE — Progress Notes (Signed)
Cardiology Office Note:    Date:  03/03/2021   ID:  Ashley Archer, DOB 1971/06/25, MRN [id number]  PCP:  Doree Albee, MD   [facility] Providers Cardiologist:  Donato Heinz, MD {  Referring MD: Doree Albee, MD   Chief Complaint  Patient presents with  . Follow-up    Seen for Dr. Gardiner Rhyme    History of Present Illness:    Ashley Archer is a 50 y.o. female with a hx of GERD who was evaluated by Dr. Gardiner Rhyme for chest discomfort.  She reportedly had bronchitis around Thanksgiving time 2020 that lasted until Christmas. She has been feeling short of breath since that time. PFT obtained on 12/25/2019 was normal. In [place] 2021, she started experiencing onset of chest pain, symptom was intermittent and lasted about 10 minutes.  She does not have any family history of heart disease and she does not have significant risk factor for coronary artery disease either.  Presentation was atypical, therefore Dr. Gardiner Rhyme recommended echocardiogram and ETT.  Echocardiogram obtained on 01/26/2020 showed EF 60 to 65%, no regional wall motion abnormality, normal RV, trivial MR, no significant valve disease.  ETT was ordered but not done as patient was unable to quarantine for 3 days prior to the study.  She was seen by Dr. Gardiner Rhyme via virtual visit on 02/16/2020 at which time she denied any further chest discomfort.  Given lack of further symptoms and the lack of risk factors, it was felt that she is okay to hold off on ETT.  More recently, patient underwent thyroid ultrasound on 02/23/2021 that noted several thyroid nodules on the left side which does not meet the criteria for biopsy.  Patient presents today for follow-up.  She denies any recent chest discomfort.  She still has occasional dyspnea on exertion, she attributed this to bronchitis.  On exam, her lungs is clear.  She has no lower extremity edema, orthopnea or PND.  Heart rate is quite regular, EKG is normal.  She can follow-up  in 1 year with Dr. Gardiner Rhyme.  Blood pressure is borderline high today at 134/86, however the last time she saw her PCP in February, her blood pressure was very much normal.  I decided to hold off on adding any blood pressure medication.   Past Medical History:  Diagnosis Date  . Allergy   . Angioedema 02/11/2018   to cefdinir  . CIN II (cervical intraepithelial neoplasia II)   . Fibroid   . GERD (gastroesophageal reflux disease)   . Perimenopause   . Urticaria   . Vitamin D deficiency disease     Past Surgical History:  Procedure Laterality Date  . CERVICAL BIOPSY  W/ LOOP ELECTRODE EXCISION  2001   CIN ll  . DILATION AND CURETTAGE OF UTERUS    . IR RADIOLOGIST EVAL & MGMT  01/18/2021  . Thumb Surg      Current Medications: Current Meds  Medication Sig  . albuterol (VENTOLIN HFA) 108 (90 Base) MCG/ACT inhaler Inhale 2 puffs into the lungs every 4 (four) hours as needed for wheezing or shortness of breath.  . Ascorbic Acid (VITAMIN C WITH ROSE HIPS) 500 MG tablet Take 500 mg by mouth daily.  . Cholecalciferol (VITAMIN D-3) 125 MCG (5000 UT) TABS Take 10,000 Units by mouth daily.   Marland Kitchen EPINEPHRINE 0.3 mg/0.3 mL IJ SOAJ injection INJECT 0.3 MLS INTO THE MUSCLE ONCE AS NEEDED FOR 1 DOSE (SEVERE ALLERGIC REACTION)  . loratadine (CLARITIN) 10 MG  tablet Take 10 mg by mouth daily as needed for allergies.  . progesterone (PROMETRIUM) 200 MG capsule Take 1 capsule (200 mg total) by mouth at bedtime.  . Tafluprost, PF, (ZIOPTAN) 0.0015 % SOLN Place 1 drop into both eyes daily. Each eye  . vitamin E 400 UNIT capsule Take 400 Units by mouth daily.     Allergies:   Cefdinir, Benzonatate, Coconut oil, and Hydrocodone   Social History   Socioeconomic History  . Marital status: Married    Spouse name: Not on file  . Number of children: Not on file  . Years of education: Not on file  . Highest education level: Not on file  Occupational History  . Not on file  Tobacco Use  . Smoking  status: Never Smoker  . Smokeless tobacco: Never Used  Vaping Use  . Vaping Use: Never used  Substance and Sexual Activity  . Alcohol use: No  . Drug use: No  . Sexual activity: Yes    Birth control/protection: Condom  Other Topics Concern  . 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   Social Determinants of Health   Financial Resource Strain: Not on file  Food Insecurity: Not on file  Transportation Needs: Not on file  Physical Activity: Not on file  Stress: Not on file  Social Connections: Not on file     Family History: The patient's family history includes Asthma in her son; Breast cancer in her paternal grandmother; Cancer (age of onset: 49) in her father; Cancer (age of onset: 40) in her mother; Diabetes in her maternal aunt and maternal grandmother; Hypertension in her mother; Pancreatic cancer in her father.  ROS:   Please see the history of present illness.     All other systems reviewed and are negative.  EKGs/Labs/Other Studies Reviewed:    The following studies were reviewed today:  Echo 01/26/2020 1. Left ventricular ejection fraction, by estimation, is 60 to 65%. The left ventricle has normal function. The left ventricle has no regional wall motion abnormalities. There is mild left ventricular hypertrophy.  Left ventricular diastolic parameters were normal.  2. Right ventricular systolic function is normal. The right ventricular size is normal. There is normal pulmonary artery systolic pressure.  3. The mitral valve is grossly normal. Trivial mitral valve  regurgitation.  4. The aortic valve is tricuspid. Aortic valve regurgitation is not  visualized.  5. The inferior vena cava is normal in size with greater than 50% respiratory variability, suggesting right atrial pressure of 3 mmHg.   EKG:  EKG is ordered today.  The ekg ordered today demonstrates sinus  bradycardia, heart rate 56 bpm.  Recent Labs: 11/15/2020: ALT 14; BUN 11; Creat 0.64; Potassium 4.2; Sodium 137  Recent Lipid Panel    Component Value Date/Time   CHOL 179 11/15/2020 1121   TRIG 63 11/15/2020 1121   HDL 82 11/15/2020 1121   CHOLHDL 2.2 11/15/2020 1121   LDLCALC 83 11/15/2020 1121     Risk Assessment/Calculations:       Physical Exam:    VS:  BP 134/86   Pulse (!) 56   Ht 5\' 2"  (1.575 m)   Wt 169 lb 3.2 oz (76.7 kg)   SpO2 98%   BMI 30.95 kg/m     Wt Readings from Last 3 Encounters:  03/01/21 169 lb 3.2 oz (76.7 kg)  11/15/20 172 lb 9.6 oz (78.3 kg)  02/16/20 168 lb (  76.2 kg)     GEN:  Well nourished, well developed in no acute distress HEENT: Normal NECK: No JVD; No carotid bruits LYMPHATICS: No lymphadenopathy CARDIAC: RRR, no murmurs, rubs, gallops RESPIRATORY:  Clear to auscultation without rales, wheezing or rhonchi  ABDOMEN: Soft, non-tender, non-distended MUSCULOSKELETAL:  No edema; No deformity  SKIN: Warm and dry NEUROLOGIC:  Alert and oriented x 3 PSYCHIATRIC:  Normal affect   ASSESSMENT:    1. Atypical chest pain    PLAN:    In order of problems listed above:  1. Atypical chest pain: Symptom has not recurred in the past year.  Given lack of symptom, will hold off on additional work-up.  Echocardiogram obtained in 2021 was normal.        Medication Adjustments/Labs and Tests Ordered: Current medicines are reviewed at length with the patient today.  Concerns regarding medicines are outlined above.  Orders Placed This Encounter  Procedures  . EKG 12-Lead   No orders of the defined types were placed in this encounter.   Patient Instructions  Medication Instructions:  Your physician recommends that you continue on your current medications as directed. Please refer to the Current Medication list given to you today.  *If you need a refill on your cardiac medications before your next appointment, please call your  pharmacy*  Lab Work: NONE ordered at this time of appointment   If you have labs (blood work) drawn today and your tests are completely normal, you will receive your results only by: Marland Kitchen MyChart Message (if you have MyChart) OR . A paper copy in the mail If you have any lab test that is abnormal or we need to change your treatment, we will call you to review the results.  Testing/Procedures: NONE ordered at this time of appointment   Follow-Up: At [facility], you and your health needs are our priority.  As part of our continuing mission to provide you with exceptional heart care, we have created designated Provider Care Teams.  These Care Teams include your primary Cardiologist (physician) and Advanced Practice Providers (APPs -  Physician Assistants and Nurse Practitioners) who all work together to provide you with the care you need, when you need it.   Your next appointment:   12 month(s)  The format for your next appointment:   In Person  Provider:   Oswaldo Milian, MD  Other Instructions      Signed, Almyra Deforest, [place]  03/03/2021 12:13 AM    [place]

## 2021-03-02 ENCOUNTER — Ambulatory Visit: Payer: 59 | Admitting: Cardiology

## 2021-03-03 ENCOUNTER — Encounter: Payer: Self-pay | Admitting: Physician Assistant

## 2021-03-27 ENCOUNTER — Ambulatory Visit: Payer: Managed Care, Other (non HMO) | Admitting: Physician Assistant

## 2021-04-18 ENCOUNTER — Other Ambulatory Visit (HOSPITAL_COMMUNITY): Payer: Self-pay | Admitting: Diagnostic Radiology

## 2021-04-18 DIAGNOSIS — D259 Leiomyoma of uterus, unspecified: Secondary | ICD-10-CM

## 2021-05-01 ENCOUNTER — Other Ambulatory Visit: Payer: Self-pay | Admitting: Radiology

## 2021-05-02 ENCOUNTER — Other Ambulatory Visit: Payer: Self-pay | Admitting: Radiology

## 2021-05-02 ENCOUNTER — Other Ambulatory Visit: Payer: Self-pay

## 2021-05-02 ENCOUNTER — Encounter (HOSPITAL_COMMUNITY): Payer: Self-pay

## 2021-05-02 ENCOUNTER — Ambulatory Visit (HOSPITAL_COMMUNITY)
Admission: RE | Admit: 2021-05-02 | Discharge: 2021-05-02 | Disposition: A | Discharge status: Home / Self Care | Payer: Managed Care, Other (non HMO) | Source: Ambulatory Visit | Attending: Diagnostic Radiology | Admitting: Diagnostic Radiology

## 2021-05-02 ENCOUNTER — Observation Stay (HOSPITAL_COMMUNITY)
Admission: RE | Admit: 2021-05-02 | Discharge: 2021-05-03 | Disposition: A | Discharge status: Home / Self Care | Payer: Managed Care, Other (non HMO) | Source: Ambulatory Visit | Attending: Diagnostic Radiology | Admitting: Diagnostic Radiology

## 2021-05-02 DIAGNOSIS — Z885 Allergy status to narcotic agent status: Secondary | ICD-10-CM | POA: Insufficient documentation

## 2021-05-02 DIAGNOSIS — Z881 Allergy status to other antibiotic agents status: Secondary | ICD-10-CM | POA: Diagnosis not present

## 2021-05-02 DIAGNOSIS — Z79899 Other long term (current) drug therapy: Secondary | ICD-10-CM | POA: Diagnosis not present

## 2021-05-02 DIAGNOSIS — D259 Leiomyoma of uterus, unspecified: Principal | ICD-10-CM | POA: Insufficient documentation

## 2021-05-02 DIAGNOSIS — N920 Excessive and frequent menstruation with regular cycle: Secondary | ICD-10-CM | POA: Diagnosis present

## 2021-05-02 DIAGNOSIS — Z20822 Contact with and (suspected) exposure to covid-19: Secondary | ICD-10-CM | POA: Diagnosis not present

## 2021-05-02 HISTORY — PX: IR US GUIDE VASC ACCESS RIGHT: IMG2390

## 2021-05-02 HISTORY — PX: IR EMBO TUMOR ORGAN ISCHEMIA INFARCT INC GUIDE ROADMAPPING: IMG5449

## 2021-05-02 HISTORY — PX: IR ANGIOGRAM PELVIS SELECTIVE OR SUPRASELECTIVE: IMG661

## 2021-05-02 HISTORY — PX: IR ANGIOGRAM SELECTIVE EACH ADDITIONAL VESSEL: IMG667

## 2021-05-02 LAB — BASIC METABOLIC PANEL
Anion gap: 8 (ref 5–15)
BUN: 16 mg/dL (ref 6–20)
CO2: 25 mmol/L (ref 22–32)
Calcium: 9.6 mg/dL (ref 8.9–10.3)
Chloride: 104 mmol/L (ref 98–111)
Creatinine, Ser: 0.73 mg/dL (ref 0.44–1.00)
GFR, Estimated: >60 mL/min (ref >60)
Glucose, Bld: 96 mg/dL (ref 70–99)
Potassium: 3.9 mmol/L (ref 3.5–5.1)
Sodium: 137 mmol/L (ref 135–145)

## 2021-05-02 LAB — CBC WITH DIFFERENTIAL/PLATELET
Abs Immature Granulocytes: 0 10*3/uL (ref 0.00–0.07)
Basophils Absolute: 0 10*3/uL (ref 0.0–0.1)
Basophils Relative: 0 %
Eosinophils Absolute: 0.1 10*3/uL (ref 0.0–0.5)
Eosinophils Relative: 2 %
HCT: 43 % (ref 36.0–46.0)
Hemoglobin: 14.1 g/dL (ref 12.0–15.0)
Immature Granulocytes: 0 %
Lymphocytes Relative: 48 %
Lymphs Abs: 2 10*3/uL (ref 0.7–4.0)
MCH: 30.9 pg (ref 26.0–34.0)
MCHC: 32.8 g/dL (ref 30.0–36.0)
MCV: 94.3 fL (ref 80.0–100.0)
Monocytes Absolute: 0.3 10*3/uL (ref 0.1–1.0)
Monocytes Relative: 6 %
Neutro Abs: 1.8 10*3/uL (ref 1.7–7.7)
Neutrophils Relative %: 44 %
Platelets: 133 10*3/uL — ABNORMAL LOW (ref 150–400)
RBC: 4.56 MIL/uL (ref 3.87–5.11)
RDW: 12.8 % (ref 11.5–15.5)
WBC: 4 10*3/uL (ref 4.0–10.5)
nRBC: 0 % (ref 0.0–0.2)

## 2021-05-02 LAB — HCG, SERUM, QUALITATIVE: Preg, Serum: NEGATIVE

## 2021-05-02 LAB — SARS CORONAVIRUS 2 BY RT PCR (HOSPITAL ORDER, PERFORMED IN ~~LOC~~ HOSPITAL LAB): SARS Coronavirus 2: NEGATIVE

## 2021-05-02 LAB — PROTIME-INR
INR: 1 (ref 0.8–1.2)
Prothrombin Time: 13.1 seconds (ref 11.4–15.2)

## 2021-05-02 MED ORDER — KETOROLAC TROMETHAMINE 30 MG/ML IJ SOLN
30.0000 mg | Freq: Four times a day (QID) | INTRAMUSCULAR | Status: DC | PRN
  Administered 2021-05-02: 30 mg via INTRAVENOUS
  Filled 2021-05-02: qty 1

## 2021-05-02 MED ORDER — SODIUM CHLORIDE 0.9 % IV SOLN: INTRAVENOUS | Status: DC

## 2021-05-02 MED ORDER — NALOXONE HCL 0.4 MG/ML IJ SOLN
0.4000 mg | INTRAMUSCULAR | Status: DC | PRN
  Filled 2021-05-02: qty 1

## 2021-05-02 MED ORDER — ONDANSETRON HCL 4 MG/2ML IJ SOLN
4.0000 mg | Freq: Once | INTRAMUSCULAR | Status: AC
  Filled 2021-05-02: qty 2

## 2021-05-02 MED ORDER — FENTANYL CITRATE (PF) 100 MCG/2ML IJ SOLN
INTRAMUSCULAR | Status: AC
  Filled 2021-05-02: qty 2

## 2021-05-02 MED ORDER — ASCORBIC ACID 500 MG PO TABS
500.0000 mg | ORAL_TABLET | Freq: Every day | ORAL | Status: DC
  Administered 2021-05-03: 500 mg via ORAL
  Filled 2021-05-02: qty 1

## 2021-05-02 MED ORDER — FENTANYL 50 MCG/ML IV PCA SOLN
INTRAVENOUS | Status: DC
  Administered 2021-05-03: 50 ug via INTRAVENOUS
  Filled 2021-05-02: qty 20

## 2021-05-02 MED ORDER — DIPHENHYDRAMINE HCL 50 MG/ML IJ SOLN
12.5000 mg | Freq: Four times a day (QID) | INTRAMUSCULAR | Status: DC | PRN
  Administered 2021-05-02: 12.5 mg via INTRAVENOUS
  Filled 2021-05-02: qty 1

## 2021-05-02 MED ORDER — ALBUTEROL SULFATE (2.5 MG/3ML) 0.083% IN NEBU: 2.5000 mg | INHALATION_SOLUTION | RESPIRATORY_TRACT | Status: DC | PRN

## 2021-05-02 MED ORDER — MIDAZOLAM HCL 2 MG/2ML IJ SOLN
INTRAMUSCULAR | Status: AC | PRN
  Administered 2021-05-02 (×4): 1 mg via INTRAVENOUS

## 2021-05-02 MED ORDER — MIDAZOLAM HCL 2 MG/2ML IJ SOLN
INTRAMUSCULAR | Status: AC
  Filled 2021-05-02: qty 6

## 2021-05-02 MED ORDER — SODIUM CHLORIDE 0.9% FLUSH: 9.0000 mL | INTRAVENOUS | Status: DC | PRN

## 2021-05-02 MED ORDER — LIDOCAINE HCL 1 % IJ SOLN
INTRAMUSCULAR | Status: AC
  Filled 2021-05-02: qty 20

## 2021-05-02 MED ORDER — VITAMIN E 45 MG (100 UNIT) PO CAPS
400.0000 [IU] | ORAL_CAPSULE | Freq: Every day | ORAL | Status: DC
  Filled 2021-05-02: qty 4

## 2021-05-02 MED ORDER — PROGESTERONE MICRONIZED 100 MG PO CAPS
200.0000 mg | ORAL_CAPSULE | Freq: Every day | ORAL | Status: DC
  Administered 2021-05-03: 200 mg via ORAL
  Filled 2021-05-02: qty 2

## 2021-05-02 MED ORDER — FENTANYL CITRATE (PF) 100 MCG/2ML IJ SOLN
INTRAMUSCULAR | Status: AC | PRN
  Administered 2021-05-02 (×3): 50 ug via INTRAVENOUS

## 2021-05-02 MED ORDER — VANCOMYCIN HCL IN DEXTROSE 1-5 GM/200ML-% IV SOLN
1000.0000 mg | INTRAVENOUS | Status: AC
  Administered 2021-05-02: 1000 mg via INTRAVENOUS
  Filled 2021-05-02: qty 200

## 2021-05-02 MED ORDER — ONDANSETRON HCL 4 MG/2ML IJ SOLN
4.0000 mg | Freq: Four times a day (QID) | INTRAMUSCULAR | Status: DC | PRN
  Administered 2021-05-02 – 2021-05-03 (×3): 4 mg via INTRAVENOUS
  Filled 2021-05-02 (×2): qty 2

## 2021-05-02 MED ORDER — IOHEXOL 300 MG/ML  SOLN
150.0000 mL | Freq: Once | INTRAMUSCULAR | Status: AC | PRN
  Administered 2021-05-02: 100 mL via INTRA_ARTERIAL

## 2021-05-02 MED ORDER — PROGESTERONE 200 MG PO CAPS: 200.0000 mg | ORAL_CAPSULE | Freq: Every day | ORAL | Status: DC

## 2021-05-02 MED ORDER — HYDROMORPHONE 1 MG/ML IV SOLN
INTRAVENOUS | Status: DC
Start: 2021-05-02 — End: 2021-05-02
  Filled 2021-05-02 (×10): qty 30

## 2021-05-02 MED ORDER — LIDOCAINE HCL (PF) 1 % IJ SOLN
INTRAMUSCULAR | Status: AC | PRN
  Administered 2021-05-02: 10 mL via INTRADERMAL

## 2021-05-02 MED ORDER — VITAMIN D 25 MCG (1000 UNIT) PO TABS
10000.0000 [IU] | ORAL_TABLET | Freq: Every day | ORAL | Status: DC
  Filled 2021-05-02: qty 10

## 2021-05-02 MED ORDER — DIPHENHYDRAMINE HCL 12.5 MG/5ML PO ELIX
12.5000 mg | ORAL_SOLUTION | Freq: Four times a day (QID) | ORAL | Status: DC | PRN
  Administered 2021-05-03: 12.5 mg via ORAL
  Filled 2021-05-02 (×2): qty 5

## 2021-05-02 MED ORDER — KETOROLAC TROMETHAMINE 30 MG/ML IJ SOLN
30.0000 mg | INTRAMUSCULAR | Status: AC
  Administered 2021-05-02: 30 mg via INTRAVENOUS
  Filled 2021-05-02: qty 1

## 2021-05-02 MED ORDER — TAFLUPROST (PF) 0.0015 % OP SOLN: 1.0000 [drp] | Freq: Every day | OPHTHALMIC | Status: DC

## 2021-05-02 NOTE — H&P (Signed)
Referring Physician(s): McComb,J  Supervising Physician: Markus Daft  Patient Status:  [facility] OP TBA  Chief Complaint:  Symptomatic uterine fibroids  Subjective: Patient familiar to IR service from consultation with Dr. Anselm Pancoast on 01/18/2021 to discuss treatment options for symptomatic uterine fibroids.  She was deemed to be an appropriate candidate for bilateral uterine artery embolization and presents today for the procedure.  She currently denies fever, chest pain, dyspnea, cough, abdominal/back pain, nausea, vomiting.  She does have a history of heavy menstrual bleeding with some occasional urinary frequency and constipation.  She does have a mild sinus headache now.  Additional medical history as below.  Past Medical History:  Diagnosis Date   Allergy    Angioedema 02/11/2018   to cefdinir   CIN II (cervical intraepithelial neoplasia II)    Fibroid    GERD (gastroesophageal reflux disease)    Perimenopause    Urticaria    Vitamin D deficiency disease    Past Surgical History:  Procedure Laterality Date   CERVICAL BIOPSY  W/ LOOP ELECTRODE EXCISION  2001   CIN ll   DILATION AND CURETTAGE OF UTERUS     IR RADIOLOGIST EVAL & MGMT  01/18/2021   Thumb Surg         Allergies: Cefdinir, Benzonatate, Coconut oil, and Hydrocodone  Medications: Prior to Admission medications   Medication Sig Start Date End Date Taking? Authorizing Provider  albuterol (VENTOLIN HFA) 108 (90 Base) MCG/ACT inhaler Inhale 2 puffs into the lungs every 4 (four) hours as needed for wheezing or shortness of breath. 10/28/19   Julian Hy, DO  Ascorbic Acid (VITAMIN C WITH ROSE HIPS) 500 MG tablet Take 500 mg by mouth daily.    [provider]  Cholecalciferol (VITAMIN D-3) 125 MCG (5000 UT) TABS Take 10,000 Units by mouth daily.     [provider]  EPINEPHRINE 0.3 mg/0.3 mL IJ SOAJ injection INJECT 0.3 MLS INTO THE MUSCLE ONCE AS NEEDED FOR 1 DOSE (SEVERE ALLERGIC REACTION)  10/31/19   Anastasio Champion, Nimish C, MD  loratadine (CLARITIN) 10 MG tablet Take 10 mg by mouth daily as needed for allergies.    [provider]  progesterone (PROMETRIUM) 200 MG capsule Take 1 capsule (200 mg total) by mouth at bedtime. 11/03/20   Gosrani, Nimish C, MD  Tafluprost, PF, (ZIOPTAN) 0.0015 % SOLN Place 1 drop into both eyes daily. Each eye    [provider]  vitamin E 400 UNIT capsule Take 400 Units by mouth daily.    [provider]     Vital Signs: BP 125/74   Pulse 62   Temp 99.3 F (37.4 C) (Oral)   Resp 20   LMP 04/24/2021   SpO2 99%   Physical Exam awake, alert.  Chest clear to auscultation bilaterally.  Heart with regular rate and rhythm.  Abdomen soft, positive bowel sounds, currently nontender.  No lower extremity edema.  Imaging: No results found.  Labs:  CBC: No results for input(s): WBC, HGB, HCT, PLT in the last 8760 hours.  COAGS: No results for input(s): INR, APTT in the last 8760 hours.  BMP: Recent Labs    11/15/20 1121  NA 137  K 4.2  CL 103  CO2 28  GLUCOSE 94  BUN 11  CALCIUM 9.6  CREATININE 0.64  GFRNONAA 106  GFRAA 122    LIVER FUNCTION TESTS: Recent Labs    11/15/20 1121  BILITOT 0.5  AST 15  ALT 14  PROT 6.9    Assessment and Plan: Patient familiar to IR service from consultation with Dr. Anselm Pancoast on 01/18/2021 to discuss treatment options for symptomatic uterine fibroids.  She was deemed to be an appropriate candidate for bilateral uterine artery embolization and presents today for the procedure. Risks and benefits of procedure were discussed with the patient including, but not limited to bleeding, infection, vascular injury or contrast induced renal failure.  This interventional procedure involves the use of X-rays and because of the nature of the planned procedure, it is possible that we will have prolonged use of X-ray fluoroscopy.  Potential radiation risks to you include (but are not limited to) the  following: - A slightly elevated risk for cancer  several years later in life. This risk is typically less than 0.5% percent. This risk is low in comparison to the normal incidence of human cancer, which is 33% for women and 50% for men according to the [place]. - Radiation induced injury can include skin redness, resembling a rash, tissue breakdown / ulcers and hair loss (which can be temporary or permanent).   The likelihood of either of these occurring depends on the difficulty of the procedure and whether you are sensitive to radiation due to previous procedures, disease, or genetic conditions.   IF your procedure requires a prolonged use of radiation, you will be notified and given written instructions for further action.  It is your responsibility to monitor the irradiated area for the 2 weeks following the procedure and to notify your physician if you are concerned that you have suffered a radiation induced injury.    All of the patient's questions were answered, patient is agreeable to proceed.  Consent signed and in chart.  Post procedure she will be admitted to the hospital overnight for pain control  LABS PENDING  Electronically Signed: D. Rowe Robert, PA-C 05/02/2021, 12:51 PM   I spent a total of 30 minutes at the the patient's bedside AND on the patient's hospital floor or unit, greater than 50% of which was counseling/coordinating care for bilateral uterine artery embolization

## 2021-05-02 NOTE — Progress Notes (Signed)
Rapid Response was called in to evaluate patient. Her oxygen sats dropped to low 80's on room air. Placed oxygen on patient and she came up to the 90's. She said she didn't feel well. Was anticipating giving Narcan but then pt said her throat felt thick and her lips were itching. Benadryl was given. MD was notified.

## 2021-05-02 NOTE — Sedation Documentation (Signed)
TC to Pharmacy. Spoke with Sharyn Lull, [facility] to discuss PCA medication. Ok to order DIlaudid PCA per [place], [place].

## 2021-05-02 NOTE — Progress Notes (Signed)
1.8 mg cleared off PCA around 1630.

## 2021-05-02 NOTE — Significant Event (Signed)
Rapid Response Event Note   Reason for Call :  Low O2 Sat    Initial Focused Assessment:  Primary nurse concerned that patient Ashley Archer need Narcan due to increased sleepiness. Patient did wake up when nurse started asking questions.  Patient alert and oriented x 4 primary nurse placed oxygen 2 liters on patient O2 Sat's 94-99.  Patient reports that throat felt thick and lips were itchy.   Interventions:  Benadryl PRN  Pause Dilaudid  Notify MD   Plan of Care:  Benadryl See care order from MD    Event Summary:   MD Notified:  IR  Call Time:1755  Arrival Time: [place] Vitalia Stough, RN

## 2021-05-03 DIAGNOSIS — D259 Leiomyoma of uterus, unspecified: Secondary | ICD-10-CM | POA: Diagnosis not present

## 2021-05-03 MED ORDER — ONDANSETRON HCL 8 MG PO TABS: 8.0000 mg | ORAL_TABLET | Freq: Three times a day (TID) | ORAL | 0 refills | Status: AC | PRN

## 2021-05-03 MED ORDER — OXYCODONE-ACETAMINOPHEN 5-325 MG PO TABS
1.0000 | ORAL_TABLET | Freq: Four times a day (QID) | ORAL | Status: DC | PRN
  Administered 2021-05-03: 1 via ORAL
  Filled 2021-05-03: qty 2

## 2021-05-03 MED ORDER — ONDANSETRON HCL 4 MG PO TABS
4.0000 mg | ORAL_TABLET | Freq: Once | ORAL | Status: AC
  Administered 2021-05-03: 4 mg via ORAL
  Filled 2021-05-03: qty 1

## 2021-05-03 MED ORDER — DOCUSATE SODIUM 100 MG PO CAPS: 100.0000 mg | ORAL_CAPSULE | Freq: Two times a day (BID) | ORAL | Status: DC | PRN

## 2021-05-03 MED ORDER — DOCUSATE SODIUM 100 MG PO CAPS: 100.0000 mg | ORAL_CAPSULE | Freq: Two times a day (BID) | ORAL | 0 refills | Status: DC | PRN

## 2021-05-03 MED ORDER — SIMETHICONE 80 MG PO CHEW
80.0000 mg | CHEWABLE_TABLET | Freq: Once | ORAL | Status: AC
  Administered 2021-05-03: 80 mg via ORAL
  Filled 2021-05-03: qty 1

## 2021-05-03 MED ORDER — OXYCODONE-ACETAMINOPHEN 5-325 MG PO TABS: 1.0000 | ORAL_TABLET | Freq: Four times a day (QID) | ORAL | 0 refills | Status: DC | PRN

## 2021-05-03 MED ORDER — ONDANSETRON HCL 4 MG PO TABS: 8.0000 mg | ORAL_TABLET | Freq: Three times a day (TID) | ORAL | Status: DC | PRN

## 2021-05-03 NOTE — Plan of Care (Signed)
Instructions were reviewed with patient. All questions were answered. Patient was transported to main entrance by wheelchair. ° °

## 2021-05-03 NOTE — Discharge Instructions (Signed)
Avoid strenuous activity for the next few days, monitor right groin puncture site for any swelling or bleeding.  May wash site with soap and water.  Stay well-hydrated, resume home medications, take Advil 600 mg orally every 6 hours for the next 5 days then resume normal dosing.

## 2021-05-03 NOTE — Progress Notes (Signed)
Patient c/o itching and nausea. No rash noted. PRN benadryl given. IR contacted for additional antiemetic.

## 2021-05-03 NOTE — Discharge Summary (Signed)
Patient ID: Noorah B Valera MRN: [id number] DOB/AGE: Apr 11, 1971 50 y.o.  Admit date: 05/02/2021 Discharge date: 05/03/2021  Supervising Physician: Markus Daft  Patient Status: [facility] - In-pt  Admission Diagnoses: Symptomatic uterine fibroids  Discharge Diagnoses: Symptomatic uterine fibroids, status post bilateral uterine artery embolization on 05/02/2021 Active Problems:   Menorrhagia Past Medical History:  Diagnosis Date   Allergy    Angioedema 02/11/2018   to cefdinir   CIN II (cervical intraepithelial neoplasia II)    Fibroid    GERD (gastroesophageal reflux disease)    Perimenopause    Urticaria    Vitamin D deficiency disease    Past Surgical History:  Procedure Laterality Date   CERVICAL BIOPSY  W/ LOOP ELECTRODE EXCISION  2001   CIN ll   DILATION AND CURETTAGE OF UTERUS     IR ANGIOGRAM PELVIS SELECTIVE OR SUPRASELECTIVE  05/02/2021   IR ANGIOGRAM SELECTIVE EACH ADDITIONAL VESSEL  05/02/2021   IR ANGIOGRAM SELECTIVE EACH ADDITIONAL VESSEL  05/02/2021   IR EMBO TUMOR ORGAN ISCHEMIA INFARCT INC GUIDE ROADMAPPING  05/02/2021   IR RADIOLOGIST EVAL & MGMT  01/18/2021   IR US GUIDE VASC ACCESS RIGHT  05/02/2021   [facility]       Discharged Condition: good  Hospital Course: Ms. Cunniff is a 50 year old female with history of symptomatic uterine fibroids who underwent consultation with Dr. Anselm Pancoast on 01/18/2021 to discuss treatment options.  He was deemed an appropriate candidate for bilateral uterine artery embolization and underwent the procedure at [facility] 05/02/2021.  Procedure was performed via IV conscious sedation without immediate complications.  She was admitted for overnight observation for pain control.  Initially the patient was placed on Dilaudid PCA but did not tolerate it with itching and some fullness in throat and was transitioned to fentanyl PCA.  Overnight the patient did fairly well with some expected pelvic cramping and occasional nausea.  Antiemetics were  given.  On the day of discharge the patient was stable.  She continued to have some expected mild pelvic cramping with intermittent nausea.  She did have some mild itching but no rash with oxycodone which was treated with Benadryl.  She was able to tolerate her diet ,void and ambulate without difficulty.  She was deemed stable for discharge.  Prescriptions for oxycodone, Colace and Zofran were electronically prescribed for patient. She  was advised to take Advil 600 mg orally every 6 hours for the next 5 days then transition to normal dosing.  She will resume her usual home medications.  She will be scheduled for follow-up with Dr. Anselm Pancoast in [facility] in 3 to 4 weeks.  She was told to contact our service with any questions or concerns.  Patient was advised only to use oxycodone as a last resort for pain control and to rely primarily on Advil for pain control due to anti-inflammatory properties.   Consults: None  Significant Diagnostic Studies:  Results for orders placed or performed during the hospital encounter of 05/02/21  SARS Coronavirus 2 by RT PCR (hospital order, performed in [facility] hospital lab)  Result Value Ref Range   SARS Coronavirus 2 NEGATIVE NEGATIVE  CBC with Differential/Platelet  Result Value Ref Range   WBC 4.0 4.0 - 10.5 K/uL   RBC 4.56 3.87 - 5.11 MIL/uL   Hemoglobin 14.1 12.0 - 15.0 g/dL   HCT 43.0 36.0 - 46.0 %   MCV 94.3 80.0 - 100.0 fL   MCH 30.9 26.0 - 34.0 pg  MCHC 32.8 30.0 - 36.0 g/dL   RDW 12.8 11.5 - 15.5 %   Platelets 133 (L) 150 - 400 K/uL   nRBC 0.0 0.0 - 0.2 %   Neutrophils Relative % 44 %   Neutro Abs 1.8 1.7 - 7.7 K/uL   Lymphocytes Relative 48 %   Lymphs Abs 2.0 0.7 - 4.0 K/uL   Monocytes Relative 6 %   Monocytes Absolute 0.3 0.1 - 1.0 K/uL   Eosinophils Relative 2 %   Eosinophils Absolute 0.1 0.0 - 0.5 K/uL   Basophils Relative 0 %   Basophils Absolute 0.0 0.0 - 0.1 K/uL   Immature Granulocytes 0 %   Abs Immature Granulocytes 0.00 0.00 - 0.07  K/uL  Protime-INR  Result Value Ref Range   Prothrombin Time 13.1 11.4 - 15.2 seconds   INR 1.0 0.8 - 1.2  Basic metabolic panel  Result Value Ref Range   Sodium 137 135 - 145 mmol/L   Potassium 3.9 3.5 - 5.1 mmol/L   Chloride 104 98 - 111 mmol/L   CO2 25 22 - 32 mmol/L   Glucose, Bld 96 70 - 99 mg/dL   BUN 16 6 - 20 mg/dL   Creatinine, Ser 0.73 0.44 - 1.00 mg/dL   Calcium 9.6 8.9 - 10.3 mg/dL   GFR, Estimated >60 >60 mL/min   Anion gap 8 5 - 15  hCG, serum, qualitative  Result Value Ref Range   Preg, Serum NEGATIVE NEGATIVE     Treatments: Successful bilateral uterine artery embolization on 05/02/2021  Discharge Exam: Blood pressure 110/64, pulse 62, temperature 98.3 F (36.8 C), temperature source Oral, resp. rate 16, height '5\' 2"'$  (1.575 m), weight 169 lb 12.1 oz (77 kg), last menstrual period 04/24/2021, SpO2 99 %. Awake, alert.  Chest clear to auscultation bilaterally.  Heart with regular rate and rhythm.  Abdomen soft, positive bowel sounds, some mild anterior pelvic tenderness to palpation.  Puncture site right groin soft, clean, dry, nontender, no hematoma, no lower extremity edema, intact distal pulses.  Disposition: Discharge disposition: 01-Home or Self Care       Discharge Instructions     Call MD for:  difficulty breathing, headache or visual disturbances   Complete by: As directed    Call MD for:  extreme fatigue   Complete by: As directed    Call MD for:  hives   Complete by: As directed    Call MD for:  persistant dizziness or light-headedness   Complete by: As directed    Call MD for:  persistant nausea and vomiting   Complete by: As directed    Call MD for:  redness, tenderness, or signs of infection (pain, swelling, redness, odor or green/yellow discharge around incision site)   Complete by: As directed    Call MD for:  severe uncontrolled pain   Complete by: As directed    Call MD for:  temperature >100.4   Complete by: As directed    Change  dressing (specify)   Complete by: As directed    May remove Tegaderm dressing from right groin and apply Band-Aid to site for the next 2 to 3 days.  May wash site with soap and water.   Diet - low sodium heart healthy   Complete by: As directed    Discharge instructions   Complete by: As directed    Stay well-hydrated, resume home medications; take Advil 600 mg by mouth every 6 hours then resume normal dosing; avoid driving after taking  narcotics and take any narcotics with food   Driving Restrictions   Complete by: As directed    No driving for the next 24 hours or after taking narcotics   Increase activity slowly   Complete by: As directed    Lifting restrictions   Complete by: As directed    No heavy lifting for the next 3 to 4 days   May shower / Bathe   Complete by: As directed    May walk up steps   Complete by: As directed    Sexual Activity Restrictions   Complete by: As directed    No sexual intercourse for 1 week      Allergies as of 05/03/2021       Reactions   Cefdinir Anaphylaxis, Shortness Of Breath   Benzonatate Hives, Itching   Pt developed  itching and  welt-like bumps on her  face, chest, back and arms.  She also developed eye burning . No worsening shortness of breath or lip/ tongue swelling. Symptoms resolved within 36 hours with immediate discontinuation of the medication and  Benadryl and Zyrtec po   Coconut Oil Swelling, Rash   Hydrocodone Rash        Medication List     TAKE these medications    albuterol 108 (90 Base) MCG/ACT inhaler Commonly known as: VENTOLIN HFA Inhale 2 puffs into the lungs every 4 (four) hours as needed for wheezing or shortness of breath.   carboxymethylcellulose 0.5 % Soln Commonly known as: REFRESH PLUS Place 1 drop into both eyes daily as needed (dry eyes).   docusate sodium 100 MG capsule Commonly known as: COLACE Take 1 capsule (100 mg total) by mouth 2 (two) times daily as needed for mild constipation.    EPINEPHrine 0.3 mg/0.3 mL Soaj injection Commonly known as: EPI-PEN INJECT 0.3 MLS INTO THE MUSCLE ONCE AS NEEDED FOR 1 DOSE (SEVERE ALLERGIC REACTION) What changed: See the new instructions.   fluticasone 50 MCG/ACT nasal spray Commonly known as: FLONASE Place 1 spray into both nostrils daily as needed for allergies or rhinitis.   ibuprofen 200 MG tablet Commonly known as: ADVIL Take 200 mg by mouth every 6 (six) hours as needed for mild pain.   loratadine 10 MG tablet Commonly known as: CLARITIN Take 10 mg by mouth daily as needed for allergies.   ondansetron 8 MG tablet Commonly known as: ZOFRAN Take 1 tablet (8 mg total) by mouth every 8 (eight) hours as needed for nausea or vomiting. What changed:  medication strength how much to take when to take this   oxyCODONE-acetaminophen 5-325 MG tablet Commonly known as: PERCOCET/ROXICET Take 1-2 tablets by mouth every 6 (six) hours as needed for moderate pain.   progesterone 200 MG capsule Commonly known as: PROMETRIUM Take 1 capsule (200 mg total) by mouth at bedtime.   valACYclovir 500 MG tablet Commonly known as: VALTREX Take 500 mg by mouth daily as needed (breakouts).   vitamin C with rose hips 500 MG tablet Take 500 mg by mouth daily.   Vitamin D-3 125 MCG (5000 UT) Tabs Take 10,000 Units by mouth daily.   vitamin E 180 MG (400 UNITS) capsule Take 400 Units by mouth daily.   Zioptan 0.0015 % Soln Generic drug: Tafluprost (PF) Place 1 drop into both eyes daily.               Discharge Care Instructions  (From admission, onward)           Start  Ordered   05/03/21 0000  Change dressing (specify)       Comments: May remove Tegaderm dressing from right groin and apply Band-Aid to site for the next 2 to 3 days.  May wash site with soap and water.   05/03/21 1456            Follow-up Information     Markus Daft, MD Follow up.   Specialties: Interventional Radiology, Radiology Why:  Radiology will call you with follow-up appointment with Dr.Henn in 3 to 4 weeks; call [phone] or [phone] with any questions Contact information: [address] [place] [address] [phone]         Arvella Nigh, MD Follow up.   Specialty: Obstetrics and Gynecology Why: Follow-up with Dr. Radene Knee as scheduled Contact information: [address] [place] [address] [phone]                  Electronically Signed: D. Rowe Robert, PA-C 05/03/2021, 3:02 PM   I have spent Less Than 30 Minutes discharging Basilia B Bartee.

## 2021-05-03 NOTE — Progress Notes (Signed)
PCA d/c. 13 ml of fentanyl wasted in stericycle. Geralyn RN to witness.

## 2021-05-15 ENCOUNTER — Other Ambulatory Visit: Payer: Self-pay | Admitting: Critical Care Medicine

## 2021-05-17 ENCOUNTER — Encounter (INDEPENDENT_AMBULATORY_CARE_PROVIDER_SITE_OTHER): Payer: 59 | Admitting: Internal Medicine

## 2021-05-17 ENCOUNTER — Ambulatory Visit
Admission: RE | Admit: 2021-05-17 | Discharge: 2021-05-17 | Disposition: A | Discharge status: Home / Self Care | Payer: Managed Care, Other (non HMO) | Source: Ambulatory Visit | Attending: Radiology | Admitting: Radiology

## 2021-05-17 ENCOUNTER — Encounter (INDEPENDENT_AMBULATORY_CARE_PROVIDER_SITE_OTHER): Payer: Self-pay

## 2021-05-17 DIAGNOSIS — D259 Leiomyoma of uterus, unspecified: Secondary | ICD-10-CM

## 2021-05-17 HISTORY — PX: IR RADIOLOGIST EVAL & MGMT: IMG5224

## 2021-05-17 NOTE — Progress Notes (Signed)
Chief Complaint: Patient was seen in consultation today for follow-up uterine artery embolization procedure  Referring Physician(s): McComb, John  History of Present Illness: Ashley Archer is a 50 y.o. female with uterine fibroids and history of menorrhagia.  Patient underwent bilateral uterine artery embolization procedure on 05/02/2021.  The procedure was technically successful.  Patient had episode the night after the procedure where she was slow to respond and complained of thick itchy lips.  The patient's Dilaudid PCA was stopped and the symptoms were treated with Benadryl.  Otherwise, the post procedure course was uncomplicated.  Patient was discharged the following day from the hospital.  Patient had a few days of vaginal bleeding following the procedure but that has stopped.  She denies foul-smelling vaginal discharge.  Patient has no significant pain at this time.  Her main complaint after the procedure has been issues with bowel movements and constipation.  Currently her bowel movements are returning to normal..  However, she has noted some pain along her left lateral thigh and spider veins in this area.  She has no complaints at the right groin puncture site.  Past Medical History:  Diagnosis Date   Allergy    Angioedema 02/11/2018   to cefdinir   CIN II (cervical intraepithelial neoplasia II)    Fibroid    GERD (gastroesophageal reflux disease)    Perimenopause    Urticaria    Vitamin D deficiency disease     Past Surgical History:  Procedure Laterality Date   CERVICAL BIOPSY  W/ LOOP ELECTRODE EXCISION  2001   CIN ll   DILATION AND CURETTAGE OF UTERUS     IR ANGIOGRAM PELVIS SELECTIVE OR SUPRASELECTIVE  05/02/2021   IR ANGIOGRAM SELECTIVE EACH ADDITIONAL VESSEL  05/02/2021   IR ANGIOGRAM SELECTIVE EACH ADDITIONAL VESSEL  05/02/2021   IR EMBO TUMOR ORGAN ISCHEMIA INFARCT INC GUIDE ROADMAPPING  05/02/2021   IR RADIOLOGIST EVAL & MGMT  01/18/2021   IR RADIOLOGIST EVAL & MGMT   05/17/2021   IR US GUIDE VASC ACCESS RIGHT  05/02/2021   Th[facility] Surg      Allergies: Cefdinir, Benzonatate, Coconut oil, and Hydrocodone  Medications: Prior to Admission medications   Medication Sig Start Date End Date Taking? Authorizing Provider  albuterol (VENTOLIN HFA) 108 (90 Base) MCG/ACT inhaler Inhale 2 puffs into the lungs every 4 (four) hours as needed for wheezing or shortness of breath. 10/28/19   Julian Hy, DO  Ascorbic Acid (VITAMIN C WITH ROSE HIPS) 500 MG tablet Take 500 mg by mouth daily.    [provider]  carboxymethylcellulose (REFRESH PLUS) 0.5 % SOLN Place 1 drop into both eyes daily as needed (dry eyes).    [provider]  Cholecalciferol (VITAMIN D-3) 125 MCG (5000 UT) TABS Take 10,000 Units by mouth daily.     [provider]  docusate sodium (COLACE) 100 MG capsule Take 1 capsule (100 mg total) by mouth 2 (two) times daily as needed for mild constipation. 05/03/21   Allred, Darrell K, PA-C  EPINEPHRINE 0.3 mg/0.3 mL IJ SOAJ injection INJECT 0.3 MLS INTO THE MUSCLE ONCE AS NEEDED FOR 1 DOSE (SEVERE ALLERGIC REACTION) 10/31/19   Anastasio Champion, Nimish C, MD  fluticasone (FLONASE) 50 MCG/ACT nasal spray Place 1 spray into both nostrils daily as needed for allergies or rhinitis.    [provider]  ibuprofen (ADVIL) 200 MG tablet Take 200 mg by mouth every 6 (six) hours as needed for mild pain.  [provider]  loratadine (CLARITIN) 10 MG tablet Take 10 mg by mouth daily as needed for allergies.    [provider]  ondansetron (ZOFRAN) 8 MG tablet Take 1 tablet (8 mg total) by mouth every 8 (eight) hours as needed for nausea or vomiting. 05/03/21   Allred, Shirlyn Goltz, PA-C  oxyCODONE-acetaminophen (PERCOCET/ROXICET) 5-325 MG tablet Take 1-2 tablets by mouth every 6 (six) hours as needed for moderate pain. 05/03/21   Allred, Darrell K, PA-C  progesterone (PROMETRIUM) 200 MG capsule Take 1 capsule (200 mg total) by mouth at  bedtime. 11/03/20   Gosrani, Nimish C, MD  Tafluprost, PF, (ZIOPTAN) 0.0015 % SOLN Place 1 drop into both eyes daily.    [provider]  valACYclovir (VALTREX) 500 MG tablet Take 500 mg by mouth daily as needed (breakouts). 03/31/21   [provider]  vitamin E 400 UNIT capsule Take 400 Units by mouth daily.    [provider]     Family History  Problem Relation Age of Onset   Hypertension Mother    Cancer Mother 46       cervical   Pancreatic cancer Father    Cancer Father 60       pancreatic   Diabetes Maternal Aunt    Diabetes Maternal Grandmother    Breast cancer Paternal Grandmother        under 61   Asthma Son     Social History   Socioeconomic History   Marital status: Married    Spouse name: Not on file   Number of children: Not on file   Years of education: Not on file   Highest education level: Not on file  Occupational History   Not on file  Tobacco Use   Smoking status: Never   Smokeless tobacco: Never  Vaping Use   Vaping Use: Never used  Substance and Sexual Activity   Alcohol use: No   Drug use: No   Sexual activity: Yes    Birth control/protection: Condom  Other Topics Concern   Not on file  Social History Narrative   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   Social Determinants of Health   Financial Resource Strain: Not on file  Food Insecurity: Not on file  Transportation Needs: Not on file  Physical Activity: Not on file  Stress: Not on file  Social Connections: Not on file      Review of Systems  Gastrointestinal:  Positive for constipation.  Genitourinary:  Positive for vaginal bleeding.   Vital Signs: LMP 04/24/2021   Physical Exam Constitutional:      Appearance: Normal appearance.  Pulmonary:     Effort: Pulmonary effort is normal.  Musculoskeletal:     Right lower leg: No edema.     Left lower leg: No edema.     Comments: Small spider veins  along the left lateral upper thigh.  No significant tenderness in this area.  Neurological:     Mental Status: She is alert.       Imaging: IR Angiogram Pelvis Selective Or Supraselective  Result Date: 05/02/2021 INDICATION: 50 year old with uterine fibroids and menorrhagia. EXAM: 1) BILATERAL UTERINE ARTERY EMBOLIZATION 2) PELVIC ANGIOGRAPHY: BILATERAL INTERNAL ILIAC ARTERIES AND BILATERAL UTERINE ARTERIES 3) ULTRASOUND GUIDANCE FOR VASCULAR ACCESS MEDICATIONS: Vancomycin 1 g. The antibiotic was administered within 1 hour of the procedure Toradol 30 mg ANESTHESIA/SEDATION: Fentanyl 150 mcg IV; Versed 4.0 mg IV Moderate Sedation Time:  82 minutes The patient was  continuously monitored during the procedure by the interventional radiology nurse under my direct supervision. CONTRAST:  166m OMNIPAQUE IOHEXOL 300 MG/ML  SOLN FLUOROSCOPY TIME:  Fluoroscopy Time: 17 minutes, 54 seconds, 6123456mGy COMPLICATIONS: None immediate. PROCEDURE: Informed consent was obtained from the patient following explanation of the procedure, risks, benefits and alternatives. The patient understands, agrees and consents for the procedure. All questions were addressed. A time out was performed prior to the initiation of the procedure. Maximal barrier sterile technique utilized including caps, mask, sterile gowns, sterile gloves, large sterile drape, hand hygiene, and Betadine prep. The patient was placed supine on the interventional table. Ultrasound confirmed a patent right common femoral vein at the right groin. Ultrasound image was saved for documentation. The right groin was prepped and draped in a sterile fashion. Maximal barrier sterile technique was utilized including caps, mask, sterile gowns, sterile gloves, sterile drape, hand hygiene and skin antiseptic. The skin was anesthetized 1% lidocaine. Using ultrasound guidance, 21 gauge needle was directed in the right common femoral artery and a micropuncture dilator set was  placed. The vascular access was upsized to a 6-French vascular sheath. 6 French sheath was attached to a pressure bag for normal saline infusion. A Cobra catheter was used to cannulate the left common iliac artery and the left internal iliac artery. A series of arteriograms were performed to identify the left uterine artery orfice. A Progreat microcatheter was advanced into the left uterine artery. Two vials of Embospheres (500 - 700 micron) were injected through the microcatheter with fluoroscopic guidance. There was near complete stasis of the left uterine artery following administration of the particles. Angiogram was performed through the 5 French catheter within the left internal iliac artery. A Waltman's loop was formed using the Cobra catheter and the right internal iliac artery was cannulated. The right uterine artery was identified with contrast angiograms. The microcatheter was advanced into the right uterine artery and a series of angiograms were performed. One vial of Embospheres (500-700 micron) were injected through the microcatheter with fluoroscopic guidance. There was near complete stasis of the right uterine artery at the end of the procedure. The microcatheter was removed. Angiogram was performed through the 5 French catheter in the right internal iliac artery. The Cobra catheter was straightened out over the aortic bifurcation and removed over a wire. Angiogram was performed through the 6 French vascular sheath. Vascular sheath was removed using an Angioseal closure device. Right groin hemostasis at the end the procedure. FINDINGS: Early takeoff of the right profunda femoral artery. Accessed artery could be considered either right common femoral artery versus proximal right SFA. Bilateral large uterine arteries were identified. Near complete stasis in bilateral uterine arteries at end of the procedure. IMPRESSION: Successful bilateral uterine artery embolization procedure. Electronically Signed    By: AMarkus DaftM.D.   On: 05/02/2021 16:41   IR Angiogram Selective Each Additional Vessel  Result Date: 05/02/2021 INDICATION: 50year old with uterine fibroids and menorrhagia. EXAM: 1) BILATERAL UTERINE ARTERY EMBOLIZATION 2) PELVIC ANGIOGRAPHY: BILATERAL INTERNAL ILIAC ARTERIES AND BILATERAL UTERINE ARTERIES 3) ULTRASOUND GUIDANCE FOR VASCULAR ACCESS MEDICATIONS: Vancomycin 1 g. The antibiotic was administered within 1 hour of the procedure Toradol 30 mg ANESTHESIA/SEDATION: Fentanyl 150 mcg IV; Versed 4.0 mg IV Moderate Sedation Time:  82 minutes The patient was continuously monitored during the procedure by the interventional radiology nurse under my direct supervision. CONTRAST:  1023mOMNIPAQUE IOHEXOL 300 MG/ML  SOLN FLUOROSCOPY TIME:  Fluoroscopy Time: 17 minutes, 54 seconds, 63123456Gy COMPLICATIONS:  None immediate. PROCEDURE: Informed consent was obtained from the patient following explanation of the procedure, risks, benefits and alternatives. The patient understands, agrees and consents for the procedure. All questions were addressed. A time out was performed prior to the initiation of the procedure. Maximal barrier sterile technique utilized including caps, mask, sterile gowns, sterile gloves, large sterile drape, hand hygiene, and Betadine prep. The patient was placed supine on the interventional table. Ultrasound confirmed a patent right common femoral vein at the right groin. Ultrasound image was saved for documentation. The right groin was prepped and draped in a sterile fashion. Maximal barrier sterile technique was utilized including caps, mask, sterile gowns, sterile gloves, sterile drape, hand hygiene and skin antiseptic. The skin was anesthetized 1% lidocaine. Using ultrasound guidance, 21 gauge needle was directed in the right common femoral artery and a micropuncture dilator set was placed. The vascular access was upsized to a 6-French vascular sheath. 6 French sheath was attached to a  pressure bag for normal saline infusion. A Cobra catheter was used to cannulate the left common iliac artery and the left internal iliac artery. A series of arteriograms were performed to identify the left uterine artery orfice. A Progreat microcatheter was advanced into the left uterine artery. Two vials of Embospheres (500 - 700 micron) were injected through the microcatheter with fluoroscopic guidance. There was near complete stasis of the left uterine artery following administration of the particles. Angiogram was performed through the 5 French catheter within the left internal iliac artery. A Waltman's loop was formed using the Cobra catheter and the right internal iliac artery was cannulated. The right uterine artery was identified with contrast angiograms. The microcatheter was advanced into the right uterine artery and a series of angiograms were performed. One vial of Embospheres (500-700 micron) were injected through the microcatheter with fluoroscopic guidance. There was near complete stasis of the right uterine artery at the end of the procedure. The microcatheter was removed. Angiogram was performed through the 5 French catheter in the right internal iliac artery. The Cobra catheter was straightened out over the aortic bifurcation and removed over a wire. Angiogram was performed through the 6 French vascular sheath. Vascular sheath was removed using an Angioseal closure device. Right groin hemostasis at the end the procedure. FINDINGS: Early takeoff of the right profunda femoral artery. Accessed artery could be considered either right common femoral artery versus proximal right SFA. Bilateral large uterine arteries were identified. Near complete stasis in bilateral uterine arteries at end of the procedure. IMPRESSION: Successful bilateral uterine artery embolization procedure. Electronically Signed   By: Markus Daft M.D.   On: 05/02/2021 16:41   IR Angiogram Selective Each Additional Vessel  Result  Date: 05/02/2021 INDICATION: 50 year old with uterine fibroids and menorrhagia. EXAM: 1) BILATERAL UTERINE ARTERY EMBOLIZATION 2) PELVIC ANGIOGRAPHY: BILATERAL INTERNAL ILIAC ARTERIES AND BILATERAL UTERINE ARTERIES 3) ULTRASOUND GUIDANCE FOR VASCULAR ACCESS MEDICATIONS: Vancomycin 1 g. The antibiotic was administered within 1 hour of the procedure Toradol 30 mg ANESTHESIA/SEDATION: Fentanyl 150 mcg IV; Versed 4.0 mg IV Moderate Sedation Time:  82 minutes The patient was continuously monitored during the procedure by the interventional radiology nurse under my direct supervision. CONTRAST:  171m OMNIPAQUE IOHEXOL 300 MG/ML  SOLN FLUOROSCOPY TIME:  Fluoroscopy Time: 17 minutes, 54 seconds, 6123456mGy COMPLICATIONS: None immediate. PROCEDURE: Informed consent was obtained from the patient following explanation of the procedure, risks, benefits and alternatives. The patient understands, agrees and consents for the procedure. All questions were addressed. A time out  was performed prior to the initiation of the procedure. Maximal barrier sterile technique utilized including caps, mask, sterile gowns, sterile gloves, large sterile drape, hand hygiene, and Betadine prep. The patient was placed supine on the interventional table. Ultrasound confirmed a patent right common femoral vein at the right groin. Ultrasound image was saved for documentation. The right groin was prepped and draped in a sterile fashion. Maximal barrier sterile technique was utilized including caps, mask, sterile gowns, sterile gloves, sterile drape, hand hygiene and skin antiseptic. The skin was anesthetized 1% lidocaine. Using ultrasound guidance, 21 gauge needle was directed in the right common femoral artery and a micropuncture dilator set was placed. The vascular access was upsized to a 6-French vascular sheath. 6 French sheath was attached to a pressure bag for normal saline infusion. A Cobra catheter was used to cannulate the left common iliac  artery and the left internal iliac artery. A series of arteriograms were performed to identify the left uterine artery orfice. A Progreat microcatheter was advanced into the left uterine artery. Two vials of Embospheres (500 - 700 micron) were injected through the microcatheter with fluoroscopic guidance. There was near complete stasis of the left uterine artery following administration of the particles. Angiogram was performed through the 5 French catheter within the left internal iliac artery. A Waltman's loop was formed using the Cobra catheter and the right internal iliac artery was cannulated. The right uterine artery was identified with contrast angiograms. The microcatheter was advanced into the right uterine artery and a series of angiograms were performed. One vial of Embospheres (500-700 micron) were injected through the microcatheter with fluoroscopic guidance. There was near complete stasis of the right uterine artery at the end of the procedure. The microcatheter was removed. Angiogram was performed through the 5 French catheter in the right internal iliac artery. The Cobra catheter was straightened out over the aortic bifurcation and removed over a wire. Angiogram was performed through the 6 French vascular sheath. Vascular sheath was removed using an Angioseal closure device. Right groin hemostasis at the end the procedure. FINDINGS: Early takeoff of the right profunda femoral artery. Accessed artery could be considered either right common femoral artery versus proximal right SFA. Bilateral large uterine arteries were identified. Near complete stasis in bilateral uterine arteries at end of the procedure. IMPRESSION: Successful bilateral uterine artery embolization procedure. Electronically Signed   By: Markus Daft M.D.   On: 05/02/2021 16:41   IR US Guide Vasc Access Right  Result Date: 05/02/2021 INDICATION: 50 year old with uterine fibroids and menorrhagia. EXAM: 1) BILATERAL UTERINE ARTERY  EMBOLIZATION 2) PELVIC ANGIOGRAPHY: BILATERAL INTERNAL ILIAC ARTERIES AND BILATERAL UTERINE ARTERIES 3) ULTRASOUND GUIDANCE FOR VASCULAR ACCESS MEDICATIONS: Vancomycin 1 g. The antibiotic was administered within 1 hour of the procedure Toradol 30 mg ANESTHESIA/SEDATION: Fentanyl 150 mcg IV; Versed 4.0 mg IV Moderate Sedation Time:  82 minutes The patient was continuously monitored during the procedure by the interventional radiology nurse under my direct supervision. CONTRAST:  176m OMNIPAQUE IOHEXOL 300 MG/ML  SOLN FLUOROSCOPY TIME:  Fluoroscopy Time: 17 minutes, 54 seconds, 6123456mGy COMPLICATIONS: None immediate. PROCEDURE: Informed consent was obtained from the patient following explanation of the procedure, risks, benefits and alternatives. The patient understands, agrees and consents for the procedure. All questions were addressed. A time out was performed prior to the initiation of the procedure. Maximal barrier sterile technique utilized including caps, mask, sterile gowns, sterile gloves, large sterile drape, hand hygiene, and Betadine prep. The patient was placed supine on  the interventional table. Ultrasound confirmed a patent right common femoral vein at the right groin. Ultrasound image was saved for documentation. The right groin was prepped and draped in a sterile fashion. Maximal barrier sterile technique was utilized including caps, mask, sterile gowns, sterile gloves, sterile drape, hand hygiene and skin antiseptic. The skin was anesthetized 1% lidocaine. Using ultrasound guidance, 21 gauge needle was directed in the right common femoral artery and a micropuncture dilator set was placed. The vascular access was upsized to a 6-French vascular sheath. 6 French sheath was attached to a pressure bag for normal saline infusion. A Cobra catheter was used to cannulate the left common iliac artery and the left internal iliac artery. A series of arteriograms were performed to identify the left uterine artery  orfice. A Progreat microcatheter was advanced into the left uterine artery. Two vials of Embospheres (500 - 700 micron) were injected through the microcatheter with fluoroscopic guidance. There was near complete stasis of the left uterine artery following administration of the particles. Angiogram was performed through the 5 French catheter within the left internal iliac artery. A Waltman's loop was formed using the Cobra catheter and the right internal iliac artery was cannulated. The right uterine artery was identified with contrast angiograms. The microcatheter was advanced into the right uterine artery and a series of angiograms were performed. One vial of Embospheres (500-700 micron) were injected through the microcatheter with fluoroscopic guidance. There was near complete stasis of the right uterine artery at the end of the procedure. The microcatheter was removed. Angiogram was performed through the 5 French catheter in the right internal iliac artery. The Cobra catheter was straightened out over the aortic bifurcation and removed over a wire. Angiogram was performed through the 6 French vascular sheath. Vascular sheath was removed using an Angioseal closure device. Right groin hemostasis at the end the procedure. FINDINGS: Early takeoff of the right profunda femoral artery. Accessed artery could be considered either right common femoral artery versus proximal right SFA. Bilateral large uterine arteries were identified. Near complete stasis in bilateral uterine arteries at end of the procedure. IMPRESSION: Successful bilateral uterine artery embolization procedure. Electronically Signed   By: Markus Daft M.D.   On: 05/02/2021 16:41   IR EMBO TUMOR ORGAN ISCHEMIA INFARCT INC GUIDE ROADMAPPING  Result Date: 05/02/2021 INDICATION: 50 year old with uterine fibroids and menorrhagia. EXAM: 1) BILATERAL UTERINE ARTERY EMBOLIZATION 2) PELVIC ANGIOGRAPHY: BILATERAL INTERNAL ILIAC ARTERIES AND BILATERAL UTERINE  ARTERIES 3) ULTRASOUND GUIDANCE FOR VASCULAR ACCESS MEDICATIONS: Vancomycin 1 g. The antibiotic was administered within 1 hour of the procedure Toradol 30 mg ANESTHESIA/SEDATION: Fentanyl 150 mcg IV; Versed 4.0 mg IV Moderate Sedation Time:  82 minutes The patient was continuously monitored during the procedure by the interventional radiology nurse under my direct supervision. CONTRAST:  137m OMNIPAQUE IOHEXOL 300 MG/ML  SOLN FLUOROSCOPY TIME:  Fluoroscopy Time: 17 minutes, 54 seconds, 6123456mGy COMPLICATIONS: None immediate. PROCEDURE: Informed consent was obtained from the patient following explanation of the procedure, risks, benefits and alternatives. The patient understands, agrees and consents for the procedure. All questions were addressed. A time out was performed prior to the initiation of the procedure. Maximal barrier sterile technique utilized including caps, mask, sterile gowns, sterile gloves, large sterile drape, hand hygiene, and Betadine prep. The patient was placed supine on the interventional table. Ultrasound confirmed a patent right common femoral vein at the right groin. Ultrasound image was saved for documentation. The right groin was prepped and draped in a sterile fashion.  Maximal barrier sterile technique was utilized including caps, mask, sterile gowns, sterile gloves, sterile drape, hand hygiene and skin antiseptic. The skin was anesthetized 1% lidocaine. Using ultrasound guidance, 21 gauge needle was directed in the right common femoral artery and a micropuncture dilator set was placed. The vascular access was upsized to a 6-French vascular sheath. 6 French sheath was attached to a pressure bag for normal saline infusion. A Cobra catheter was used to cannulate the left common iliac artery and the left internal iliac artery. A series of arteriograms were performed to identify the left uterine artery orfice. A Progreat microcatheter was advanced into the left uterine artery. Two vials of  Embospheres (500 - 700 micron) were injected through the microcatheter with fluoroscopic guidance. There was near complete stasis of the left uterine artery following administration of the particles. Angiogram was performed through the 5 French catheter within the left internal iliac artery. A Waltman's loop was formed using the Cobra catheter and the right internal iliac artery was cannulated. The right uterine artery was identified with contrast angiograms. The microcatheter was advanced into the right uterine artery and a series of angiograms were performed. One vial of Embospheres (500-700 micron) were injected through the microcatheter with fluoroscopic guidance. There was near complete stasis of the right uterine artery at the end of the procedure. The microcatheter was removed. Angiogram was performed through the 5 French catheter in the right internal iliac artery. The Cobra catheter was straightened out over the aortic bifurcation and removed over a wire. Angiogram was performed through the 6 French vascular sheath. Vascular sheath was removed using an Angioseal closure device. Right groin hemostasis at the end the procedure. FINDINGS: Early takeoff of the right profunda femoral artery. Accessed artery could be considered either right common femoral artery versus proximal right SFA. Bilateral large uterine arteries were identified. Near complete stasis in bilateral uterine arteries at end of the procedure. IMPRESSION: Successful bilateral uterine artery embolization procedure. Electronically Signed   By: Markus Daft M.D.   On: 05/02/2021 16:41   IR Radiologist Eval & Mgmt  Result Date: 05/17/2021 Please refer to notes tab for details about interventional procedure. (Op Note)   Labs:  CBC: Recent Labs    05/02/21 1233  WBC 4.0  HGB 14.1  HCT 43.0  PLT 133*    COAGS: Recent Labs    05/02/21 1233  INR 1.0    BMP: Recent Labs    11/15/20 1121 05/02/21 1233  NA 137 137  K 4.2 3.9   CL 103 104  CO2 28 25  GLUCOSE 94 96  BUN 11 16  CALCIUM 9.6 9.6  CREATININE 0.64 0.73  GFRNONAA 106 >60  GFRAA 122  --     LIVER FUNCTION TESTS: Recent Labs    11/15/20 1121  BILITOT 0.5  AST 15  ALT 14  PROT 6.9    TUMOR MARKERS: No results for input(s): AFPTM, CEA, CA199, CHROMGRNA in the last 8760 hours.  Assessment and Plan:  50 year old with uterine fibroids and menorrhagia and status post uterine artery embolization.  Patient has recovered well from her procedure and she is no longer has pain.  Patient is returning to her normal activities.  No issues at the right groin puncture site.  Patient has intermittent soreness in the left lateral thigh and she has noted new spider veins in this area.  Currently, the patient is not having symptoms in the left thigh.  There are superficial spider veins along the left  lateral thigh but no evidence for varicosities.  No evidence of lower extremity swelling.  Advised patient to keep an eye on this area and let us know if the symptoms return.  Otherwise, we will follow-up with the patient in approximately 6 months with pelvic MRI.  Patient will contact us if she has any questions or concerns in the interim.   Electronically Signed: Burman Riis 05/17/2021, 5:40 PM   I spent a total of    10 Minutes in face to face in clinical consultation, greater than 50% of which was counseling/coordinating care for post uterine artery embolization.  Patient ID: Ashley Archer, female   DOB: [birth year], 50 y.o.   MRN: [id number]

## 2021-05-18 ENCOUNTER — Other Ambulatory Visit: Payer: Self-pay | Admitting: Critical Care Medicine

## 2021-06-14 ENCOUNTER — Other Ambulatory Visit: Payer: Managed Care, Other (non HMO)

## 2021-06-19 ENCOUNTER — Other Ambulatory Visit: Payer: Self-pay | Admitting: Critical Care Medicine

## 2021-07-05 ENCOUNTER — Other Ambulatory Visit: Payer: Self-pay | Admitting: Obstetrics and Gynecology

## 2021-07-05 DIAGNOSIS — Z1231 Encounter for screening mammogram for malignant neoplasm of breast: Secondary | ICD-10-CM

## 2021-07-06 ENCOUNTER — Ambulatory Visit
Admission: RE | Admit: 2021-07-06 | Discharge: 2021-07-06 | Disposition: A | Discharge status: Home / Self Care | Payer: Managed Care, Other (non HMO) | Source: Ambulatory Visit | Attending: Obstetrics and Gynecology | Admitting: Obstetrics and Gynecology

## 2021-07-06 ENCOUNTER — Other Ambulatory Visit: Payer: Self-pay

## 2021-07-06 DIAGNOSIS — Z1231 Encounter for screening mammogram for malignant neoplasm of breast: Secondary | ICD-10-CM

## 2021-08-07 ENCOUNTER — Encounter: Payer: Self-pay | Admitting: Primary Care

## 2021-08-07 ENCOUNTER — Other Ambulatory Visit: Payer: Self-pay

## 2021-08-07 ENCOUNTER — Ambulatory Visit (INDEPENDENT_AMBULATORY_CARE_PROVIDER_SITE_OTHER): Payer: Managed Care, Other (non HMO) | Admitting: Primary Care

## 2021-08-07 VITALS — BP 132/78 | HR 71 | Ht 62.0 in | Wt 169.4 lb

## 2021-08-07 DIAGNOSIS — J4520 Mild intermittent asthma, uncomplicated: Secondary | ICD-10-CM | POA: Diagnosis not present

## 2021-08-07 DIAGNOSIS — J3089 Other allergic rhinitis: Secondary | ICD-10-CM | POA: Diagnosis not present

## 2021-08-07 DIAGNOSIS — J45909 Unspecified asthma, uncomplicated: Secondary | ICD-10-CM | POA: Insufficient documentation

## 2021-08-07 HISTORY — DX: Mild intermittent asthma, uncomplicated: J45.20

## 2021-08-07 MED ORDER — FLUTICASONE FUROATE-VILANTEROL 100-25 MCG/ACT IN AEPB: 1.0000 | INHALATION_SPRAY | Freq: Every day | RESPIRATORY_TRACT | 3 refills | Status: DC

## 2021-08-07 NOTE — Assessment & Plan Note (Addendum)
-   Patient develops intermittent asthma symptoms when acutely ill. She had normal pulmonary function testing in March 2021. Exam today was benign, lungs were clear. Advised patient to resume BREO 185mcg one puff daily prn sob/wheezing/cough. Checking respiratory allergy panel. If symptoms do not improve or worsen consider CXR and/or methacholine challenge. FU in 1 year or sooner if needed.

## 2021-08-07 NOTE — Progress Notes (Signed)
c  @Patient  ID: Ashley Archer, female    DOB: 03/03/1971, 50 y.o.   MRN: [id number]  Chief Complaint  Patient presents with   Follow-up    Med refill    Referring provider: No ref. provider found  HPI: 50 year old female, never smoked. PMH significant for mild intermittent asthma. Former patient of Dr. Carlis Abbott. She remains on Breo 100, ordered for PFTs to rule out underlying asthma.   Previous [facility] pulmonary encounter:  12/25/2019 Patient presents today for follow-up visit with PFTs. She is doing well, no acute complaints. She stopped taking Breo 1-2 weeks ago and has had no issues with shortness of breath or cough. Her pulmonary function testing today appear normal. No prior history of childhood asthma.    08/07/2021- Interim hx  Patient presents today for overdue follow-up/intermittent asthma. Patient developed shortness of breath and chest tightness 2 weeks ago. She heard some mild crackles and wheezing when laying down at night.  At that time she had some nasal congestion which resolved with saline nasal spray. She is needing to clear her throat more often. She no longer has prescription for BREO but states that it helped in the past.   Pulmonary function testing: 12/25/2019 PFT - FVC 2.82 (102%), FEV1 2.36 (106%), ratio 84, 2% BD, TLC 89%, DLCOcor 24.54 (123%)/ Normal spirometry. No bronchodilator response. Increased diffusion capacity.   Imaging: 08/26/2019 CXR- No acute cardiopulmonary disease, lungs clear   Allergies  Allergen Reactions   Cefdinir Anaphylaxis and Shortness Of Breath   Benzonatate Hives and Itching    Pt developed  itching and  welt-like bumps on her  face, chest, back and arms.  She also developed eye burning . No worsening shortness of breath or lip/ tongue swelling. Symptoms resolved within 36 hours with immediate discontinuation of the medication and  Benadryl and Zyrtec po    Coconut Oil Swelling and Rash   Hydrocodone Rash    Immunization History   Administered Date(s) Administered   Influenza Split 09/27/2011   Influenza,inj,Quad PF,6+ Mos 07/30/2012, 07/15/2019, 07/21/2021   PFIZER(Purple Top)SARS-COV-2 Vaccination 10/14/2019    Past Medical History:  Diagnosis Date   Allergy    Angioedema 02/11/2018   to cefdinir   CIN II (cervical intraepithelial neoplasia II)    Fibroid    GERD (gastroesophageal reflux disease)    Intermittent asthma 08/07/2021   Perimenopause    Urticaria    Vitamin D deficiency disease     Tobacco History: Social History   Tobacco Use  Smoking Status Never  Smokeless Tobacco Never   Counseling given: Not Answered   Outpatient Medications Prior to Visit  Medication Sig Dispense Refill   Ascorbic Acid (VITAMIN C WITH ROSE HIPS) 500 MG tablet Take 500 mg by mouth daily.     carboxymethylcellulose (REFRESH PLUS) 0.5 % SOLN Place 1 drop into both eyes daily as needed (dry eyes).     Cholecalciferol (VITAMIN D-3) 125 MCG (5000 UT) TABS Take 10,000 Units by mouth daily.      EPINEPHRINE 0.3 mg/0.3 mL IJ SOAJ injection INJECT 0.3 MLS INTO THE MUSCLE ONCE AS NEEDED FOR 1 DOSE (SEVERE ALLERGIC REACTION) 2 each 1   ibuprofen (ADVIL) 200 MG tablet Take 200 mg by mouth every 6 (six) hours as needed for mild pain.     loratadine (CLARITIN) 10 MG tablet Take 10 mg by mouth daily as needed for allergies.     ondansetron (ZOFRAN) 8 MG tablet Take 1 tablet (8 mg total)  by mouth every 8 (eight) hours as needed for nausea or vomiting. 20 tablet 0   progesterone (PROMETRIUM) 200 MG capsule Take 1 capsule (200 mg total) by mouth at bedtime. 90 capsule 1   Tafluprost, PF, (ZIOPTAN) 0.0015 % SOLN Place 1 drop into both eyes daily.     valACYclovir (VALTREX) 500 MG tablet Take 500 mg by mouth daily as needed (breakouts).     vitamin E 400 UNIT capsule Take 400 Units by mouth daily.     albuterol (VENTOLIN HFA) 108 (90 Base) MCG/ACT inhaler Inhale 2 puffs into the lungs every 4 (four) hours as needed for wheezing or  shortness of breath. (Patient not taking: Reported on 08/07/2021) 18 g 5   docusate sodium (COLACE) 100 MG capsule Take 1 capsule (100 mg total) by mouth 2 (two) times daily as needed for mild constipation. (Patient not taking: Reported on 08/07/2021) 10 capsule 0   fluticasone (FLONASE) 50 MCG/ACT nasal spray Place 1 spray into both nostrils daily as needed for allergies or rhinitis. (Patient not taking: Reported on 08/07/2021)     oxyCODONE-acetaminophen (PERCOCET/ROXICET) 5-325 MG tablet Take 1-2 tablets by mouth every 6 (six) hours as needed for moderate pain. (Patient not taking: Reported on 08/07/2021) 20 tablet 0   No facility-administered medications prior to visit.   Review of Systems  Review of Systems  Constitutional: Negative.   HENT: Negative.    Respiratory:         Intermittent cough, chest tightness   Cardiovascular: Negative.     Physical Exam  BP 132/78 (BP Location: Left Arm, Cuff Size: Normal)   Pulse 71   Ht 5\' 2"  (1.575 m)   Wt 169 lb 6.4 oz (76.8 kg)   SpO2 99%   BMI 30.98 kg/m  Physical Exam Constitutional:      Appearance: Normal appearance.  HENT:     Head: Normocephalic and atraumatic.     Mouth/Throat:     Mouth: Mucous membranes are moist.     Pharynx: Oropharynx is clear.  Cardiovascular:     Rate and Rhythm: Normal rate and regular rhythm.     Comments: RRR Pulmonary:     Effort: Pulmonary effort is normal.     Breath sounds: Normal breath sounds. No wheezing, rhonchi or rales.     Comments: CTA  Musculoskeletal:     Cervical back: Normal range of motion and neck supple.  Skin:    General: Skin is warm and dry.  Neurological:     General: No focal deficit present.     Mental Status: She is alert and oriented to person, place, and time. Mental status is at baseline.  Psychiatric:        Mood and Affect: Mood normal.        Behavior: Behavior normal.        Thought Content: Thought content normal.        Judgment: Judgment normal.      Lab Results:  CBC    Component Value Date/Time   WBC 4.0 05/02/2021 1233   RBC 4.56 05/02/2021 1233   HGB 14.1 05/02/2021 1233   HGB 14.2 12/31/2017 1601   HCT 43.0 05/02/2021 1233   HCT 42.3 12/31/2017 1601   PLT 133 (L) 05/02/2021 1233   PLT 202 12/31/2017 1601   MCV 94.3 05/02/2021 1233   MCV 93 12/31/2017 1601   MCH 30.9 05/02/2021 1233   MCHC 32.8 05/02/2021 1233   RDW 12.8 05/02/2021 1233   RDW  12.7 12/31/2017 1601   LYMPHSABS 2.0 05/02/2021 1233   LYMPHSABS 2.1 12/31/2017 1601   MONOABS 0.3 05/02/2021 1233   EOSABS 0.1 05/02/2021 1233   EOSABS 0.0 12/31/2017 1601   BASOSABS 0.0 05/02/2021 1233   BASOSABS 0.0 12/31/2017 1601    BMET    Component Value Date/Time   NA 137 05/02/2021 1233   NA 141 12/31/2017 1601   K 3.9 05/02/2021 1233   CL 104 05/02/2021 1233   CO2 25 05/02/2021 1233   GLUCOSE 96 05/02/2021 1233   BUN 16 05/02/2021 1233   BUN 21 12/31/2017 1601   CREATININE 0.73 05/02/2021 1233   CREATININE 0.64 11/15/2020 1121   CALCIUM 9.6 05/02/2021 1233   GFRNONAA >60 05/02/2021 1233   GFRNONAA 106 11/15/2020 1121   GFRAA 122 11/15/2020 1121    BNP No results found for: BNP  ProBNP No results found for: PROBNP  Imaging: No results found.   Assessment & Plan:   Intermittent asthma - Patient develops intermittent asthma symptoms when acutely ill. She had normal pulmonary function testing in March 2021. Exam today was benign, lungs were clear. Advised patient to resume BREO 187mcg one puff daily prn sob/wheezing/cough. Checking respiratory allergy panel. If symptoms do not improve or worsen consider CXR and/or methacholine challenge. FU in 1 year or sooner if needed.    Martyn Ehrich, NP 08/07/2021

## 2021-08-07 NOTE — Patient Instructions (Addendum)
Recommendations: - Use Breo 119mcg 1 puff daily as needed for intermittent asthma/bronchitis symptoms - If symptoms occurring daily please return sooner   Orders: - Respiratory allergy profile (ordered)  Follow-up: - 1 year with new [facility] pulmonary provider (Dr. Shearon Stalls or [facility]- 30 min slot)

## 2021-08-08 LAB — RESPIRATORY ALLERGY PROFILE REGION II ~~LOC~~
Allergen, A. alternata, m6: <0.1 kU/L
Allergen, Cedar tree, t12: <0.1 kU/L
Allergen, Comm Silver Birch, t9: 0.11 kU/L — ABNORMAL HIGH
Allergen, Cottonwood, t14: <0.1 kU/L
Allergen, D pternoyssinus,d7: <0.1 kU/L
Allergen, Mouse Urine Protein, e78: <0.1 kU/L
Allergen, Mulberry, t76: 0.12 kU/L — ABNORMAL HIGH
Allergen, Oak,t7: <0.1 kU/L
Allergen, P. notatum, m1: <0.1 kU/L
Aspergillus fumigatus, m3: <0.1 kU/L
Bermuda Grass: <0.1 kU/L
Box Elder IgE: <0.1 kU/L
CLADOSPORIUM HERBARUM (M2) IGE: <0.1 kU/L
COMMON RAGWEED (SHORT) (W1) IGE: 0.13 kU/L — ABNORMAL HIGH
Cat Dander: <0.1 kU/L
Class: 0
Class: 0
Class: 0
Class: 0
Class: 0
Class: 0
Class: 0
Class: 0
Class: 0
Class: 0
Class: 0
Class: 0
Class: 0
Class: 0
Class: 0
Class: 0
Class: 0
Class: 0
Class: 0
Class: 1
Cockroach: <0.1 kU/L
D. farinae: <0.1 kU/L
Dog Dander: <0.1 kU/L
Elm IgE: 0.54 kU/L — ABNORMAL HIGH
IgE (Immunoglobulin E), Serum: 50 kU/L (ref <=114)
Johnson Grass: <0.1 kU/L
Pecan/Hickory Tree IgE: 0.31 kU/L — ABNORMAL HIGH
Rough Pigweed  IgE: <0.1 kU/L
Sheep Sorrel IgE: <0.1 kU/L
Timothy Grass: <0.1 kU/L

## 2021-08-08 LAB — INTERPRETATION:

## 2021-08-08 NOTE — Progress Notes (Signed)
Please let patient know she has class 1 allergies to silver birch, Elm, pecan/hickory tree, mulberry, common ragweed

## 2021-08-09 NOTE — Telephone Encounter (Signed)
EW it looks like the BREO is not covered by her insurance.   Is there another med you want to send in or do you want to start the PA for this?  thanks

## 2021-08-09 NOTE — Telephone Encounter (Signed)
What ICS/LABA is preferred on patient's plan

## 2021-08-10 NOTE — Telephone Encounter (Signed)
Pharmacy team is unable to assist with non-specialty related inquiries at this time, please contact pt's insurance plan and request copy of formulary medication list. [phone]

## 2021-08-17 NOTE — Progress Notes (Signed)
Called pt and there was no answer-LMTCB °

## 2021-09-01 ENCOUNTER — Ambulatory Visit (INDEPENDENT_AMBULATORY_CARE_PROVIDER_SITE_OTHER): Payer: Managed Care, Other (non HMO)

## 2021-09-01 ENCOUNTER — Other Ambulatory Visit: Payer: Self-pay

## 2021-09-01 ENCOUNTER — Ambulatory Visit (INDEPENDENT_AMBULATORY_CARE_PROVIDER_SITE_OTHER): Payer: Managed Care, Other (non HMO) | Admitting: Adult Health

## 2021-09-01 ENCOUNTER — Encounter: Payer: Self-pay | Admitting: Adult Health

## 2021-09-01 VITALS — BP 124/80 | HR 66 | Temp 98.2°F | Ht 62.0 in | Wt 173.4 lb

## 2021-09-01 DIAGNOSIS — T7840XA Allergy, unspecified, initial encounter: Secondary | ICD-10-CM | POA: Diagnosis not present

## 2021-09-01 DIAGNOSIS — J4520 Mild intermittent asthma, uncomplicated: Secondary | ICD-10-CM

## 2021-09-01 MED ORDER — PREDNISONE 10 MG PO TABS: ORAL_TABLET | ORAL | 0 refills | Status: DC

## 2021-09-01 MED ORDER — METHYLPREDNISOLONE ACETATE 80 MG/ML IJ SUSP
80.0000 mg | Freq: Once | INTRAMUSCULAR | Status: AC
Start: 2021-09-01 — End: 2021-09-01
  Administered 2021-09-01: 80 mg via INTRAMUSCULAR

## 2021-09-01 MED ORDER — MOMETASONE FURO-FORMOTEROL FUM 100-5 MCG/ACT IN AERO: 2.0000 | INHALATION_SPRAY | Freq: Two times a day (BID) | RESPIRATORY_TRACT | 5 refills | Status: DC

## 2021-09-01 MED ORDER — ALBUTEROL SULFATE HFA 108 (90 BASE) MCG/ACT IN AERS: 2.0000 | INHALATION_SPRAY | RESPIRATORY_TRACT | 5 refills | Status: DC | PRN

## 2021-09-01 MED ORDER — ALBUTEROL SULFATE (2.5 MG/3ML) 0.083% IN NEBU
2.5000 mg | INHALATION_SOLUTION | Freq: Once | RESPIRATORY_TRACT | Status: AC
  Administered 2021-09-01: 2.5 mg via RESPIRATORY_TRACT

## 2021-09-01 MED ORDER — EPINEPHRINE 0.3 MG/0.3ML IJ SOAJ: INTRAMUSCULAR | 1 refills | Status: DC

## 2021-09-01 NOTE — Progress Notes (Signed)
@Patient  ID: Ashley Archer, female    DOB: 10/28/1970, 50 y.o.   MRN: [id number]  Chief Complaint  Patient presents with   Acute Visit    Referring provider: No ref. provider found  HPI: 50 year old female never smoker followed for mild intermittent asthma and allergic rhinitis  TEST/EVENTS :  12/25/2019 PFT - FVC 2.82 (102%), FEV1 2.36 (106%), ratio 84, 2% BD, TLC 89%, DLCOcor 24.54 (123%)/ Normal spirometry. No bronchodilator response. Increased diffusion capacity.    Imaging: 08/26/2019 CXR- No acute cardiopulmonary disease, lungs clear   09/01/2021 Acute OV : Asthma  Patient presents for an acute office visit.  Patient is followed for mild intermittent asthma and allergic rhinitis.  Patient says she has been doing well up until yesterday.  Patient says she woke up with a mild headache feeling that she had some itchiness in her throat with a thickness.  As the day went on she started to feel a little bit tight in her chest with some intermittent wheezing.  This morning she woke up with feeling that her eyes lips and face was puffy.  She also had a worsening scratchy throat.  Patient denies any difficulty swallowing.  She has had no tongue swelling.  Patient's had no new medications.  She says the only thing that is changed that she got a new organic Chapstick and ate some canned fruit and drink the canned fruit juice other than that she is had no new medications no recent antibiotics.  She does have a previous history of anaphylaxis after taking cefdinir.  She does have an EpiPen but says that she needs to have a refill as it is expired.  She has previously been seen by allergist in the past and was on allergy vaccines for a while but has not been on this for a few years.  Patient denies any chest pain orthopnea PND leg swelling rash joint swelling recent travel vomiting or diarrhea.  Did have some mild nausea yesterday.  Patient is working today. Patient took Benadryl twice yesterday and  this morning.  25 mg   Allergies  Allergen Reactions   Cefdinir Anaphylaxis and Shortness Of Breath   Benzonatate Hives and Itching    Pt developed  itching and  welt-like bumps on her  face, chest, back and arms.  She also developed eye burning . No worsening shortness of breath or lip/ tongue swelling. Symptoms resolved within 36 hours with immediate discontinuation of the medication and  Benadryl and Zyrtec po    Coconut Oil Swelling and Rash   Hydrocodone Rash    Immunization History  Administered Date(s) Administered   Influenza Split 09/27/2011   Influenza,inj,Quad PF,6+ Mos 07/30/2012, 07/15/2019, 07/21/2021   PFIZER(Purple Top)SARS-COV-2 Vaccination 10/14/2019    Past Medical History:  Diagnosis Date   Allergy    Angioedema 02/11/2018   to cefdinir   CIN II (cervical intraepithelial neoplasia II)    Fibroid    GERD (gastroesophageal reflux disease)    Intermittent asthma 08/07/2021   Perimenopause    Urticaria    Vitamin D deficiency disease     Tobacco History: Social History   Tobacco Use  Smoking Status Never  Smokeless Tobacco Never   Counseling given: Not Answered   Outpatient Medications Prior to Visit  Medication Sig Dispense Refill   Ascorbic Acid (VITAMIN C WITH ROSE HIPS) 500 MG tablet Take 500 mg by mouth daily.     carboxymethylcellulose (REFRESH PLUS) 0.5 % SOLN Place 1 drop into  both eyes daily as needed (dry eyes).     Cholecalciferol (VITAMIN D-3) 125 MCG (5000 UT) TABS Take 10,000 Units by mouth daily.      docusate sodium (COLACE) 100 MG capsule Take 1 capsule (100 mg total) by mouth 2 (two) times daily as needed for mild constipation. 10 capsule 0   ibuprofen (ADVIL) 200 MG tablet Take 200 mg by mouth every 6 (six) hours as needed for mild pain.     loratadine (CLARITIN) 10 MG tablet Take 10 mg by mouth daily as needed for allergies.     ondansetron (ZOFRAN) 8 MG tablet Take 1 tablet (8 mg total) by mouth every 8 (eight) hours as needed  for nausea or vomiting. 20 tablet 0   progesterone (PROMETRIUM) 200 MG capsule Take 1 capsule (200 mg total) by mouth at bedtime. 90 capsule 1   Tafluprost, PF, (ZIOPTAN) 0.0015 % SOLN Place 1 drop into both eyes daily.     valACYclovir (VALTREX) 500 MG tablet Take 500 mg by mouth daily as needed (breakouts).     vitamin E 400 UNIT capsule Take 400 Units by mouth daily.     albuterol (VENTOLIN HFA) 108 (90 Base) MCG/ACT inhaler Inhale 2 puffs into the lungs every 4 (four) hours as needed for wheezing or shortness of breath. 18 g 5   EPINEPHRINE 0.3 mg/0.3 mL IJ SOAJ injection INJECT 0.3 MLS INTO THE MUSCLE ONCE AS NEEDED FOR 1 DOSE (SEVERE ALLERGIC REACTION) 2 each 1   fluticasone furoate-vilanterol (BREO ELLIPTA) 100-25 MCG/ACT AEPB Inhale 1 puff into the lungs daily. 60 each 3   fluticasone (FLONASE) 50 MCG/ACT nasal spray Place 1 spray into both nostrils daily as needed for allergies or rhinitis. (Patient not taking: Reported on 08/07/2021)     oxyCODONE-acetaminophen (PERCOCET/ROXICET) 5-325 MG tablet Take 1-2 tablets by mouth every 6 (six) hours as needed for moderate pain. (Patient not taking: Reported on 08/07/2021) 20 tablet 0   No facility-administered medications prior to visit.     Review of Systems:   Constitutional:   No  weight loss, night sweats,  Fevers, chills, fatigue, or  lassitude.  HEENT:   No headaches,  Difficulty swallowing,  Tooth/dental problems, or  Sore throat,                No sneezing, itching, ear ache, nasal congestion, post nasal drip, + lip /facial puffiness   CV:  No chest pain,  Orthopnea, PND, swelling in lower extremities, anasarca, dizziness, palpitations, syncope.   GI  No heartburn, indigestion, abdominal pain, nausea, vomiting, diarrhea, change in bowel habits, loss of appetite, bloody stools.   Resp:  No chest wall deformity  Skin: no rash or lesions.  GU: no dysuria, change in color of urine, no urgency or frequency.  No flank pain, no  hematuria   MS:  No joint pain or swelling.  No decreased range of motion.  No back pain.    Physical Exam  BP 124/80 (BP Location: Left Arm, Patient Position: Sitting, Cuff Size: Normal)   Pulse 66   Temp 98.2 F (36.8 C) (Oral)   Ht 5\' 2"  (1.575 m)   Wt 173 lb 6.4 oz (78.7 kg)   SpO2 96%   BMI 31.72 kg/m   GEN: A/Ox3; pleasant , NAD, well nourished    HEENT:  [place]/AT,  NOSE-clear, THROAT-clear, no lesions, no postnasal drip or exudate noted.  Tongue is midline.  No swelling noted.  Posterior pharynx is clear with no swelling  or redness or lesions. Lips appear to be slightly puffy without redness or significant edema, upper eyelids mildly puffy with no periorbital edema noted  NECK:  Supple w/ fair ROM; no JVD; normal carotid impulses w/o bruits; no thyromegaly or nodules palpated; no lymphadenopathy.  No stridor noted.  RESP  Clear  P & A; w/o, wheezes/ rales/ or rhonchi. no accessory muscle use, no dullness to percussion, speaks in full sentences with no audible distress.  CARD:  RRR, no m/r/g, no peripheral edema, pulses intact, no cyanosis or clubbing.  GI:   Soft & nt; nml bowel sounds; no organomegaly or masses detected.   Musco: Warm bil, no deformities or joint swelling noted.   Neuro: alert, no focal deficits noted.    Skin: Warm, no lesions or rashes, skin is clear without hives or rash noted.    Lab Results:  CBC    Component Value Date/Time   WBC 4.0 05/02/2021 1233   RBC 4.56 05/02/2021 1233   HGB 14.1 05/02/2021 1233   HGB 14.2 12/31/2017 1601   HCT 43.0 05/02/2021 1233   HCT 42.3 12/31/2017 1601   PLT 133 (L) 05/02/2021 1233   PLT 202 12/31/2017 1601   MCV 94.3 05/02/2021 1233   MCV 93 12/31/2017 1601   MCH 30.9 05/02/2021 1233   MCHC 32.8 05/02/2021 1233   RDW 12.8 05/02/2021 1233   RDW 12.7 12/31/2017 1601   LYMPHSABS 2.0 05/02/2021 1233   LYMPHSABS 2.1 12/31/2017 1601   MONOABS 0.3 05/02/2021 1233   EOSABS 0.1 05/02/2021 1233   EOSABS  0.0 12/31/2017 1601   BASOSABS 0.0 05/02/2021 1233   BASOSABS 0.0 12/31/2017 1601    BMET    Component Value Date/Time   NA 137 05/02/2021 1233   NA 141 12/31/2017 1601   K 3.9 05/02/2021 1233   CL 104 05/02/2021 1233   CO2 25 05/02/2021 1233   GLUCOSE 96 05/02/2021 1233   BUN 16 05/02/2021 1233   BUN 21 12/31/2017 1601   CREATININE 0.73 05/02/2021 1233   CREATININE 0.64 11/15/2020 1121   CALCIUM 9.6 05/02/2021 1233   GFRNONAA >60 05/02/2021 1233   GFRNONAA 106 11/15/2020 1121   GFRAA 122 11/15/2020 1121    BNP No results found for: BNP  ProBNP No results found for: PROBNP  Imaging: DG Chest 2 View  Result Date: 09/01/2021 CLINICAL DATA:  Asthma EXAM: CHEST - 2 VIEW COMPARISON:  08/26/2019 FINDINGS: The heart size and mediastinal contours are within normal limits. Both lungs are clear. The visualized skeletal structures are unremarkable. IMPRESSION: No active cardiopulmonary disease. Electronically Signed   By: Jerilynn Mages.  Shick M.D.   On: 09/01/2021 11:57    albuterol (PROVENTIL) (2.5 MG/3ML) 0.083% nebulizer solution 2.5 mg     Date Action Dose Route User   09/01/2021 1135 Given 2.5 mg Nebulization Mabe, Beatris Ship, CMA      methylPREDNISolone acetate (DEPO-MEDROL) injection 80 mg     Date Action Dose Route User   09/01/2021 1126 Given 80 mg Intramuscular (Right Upper Outer Quadrant) Mabe, Beatris Ship, CMA       PFT Results Latest Ref Rng & Units 12/25/2019  FVC-Pre L 2.69  FVC-Predicted Pre % 98  FVC-Post L 2.82  FVC-Predicted Post % 102  Pre FEV1/FVC % % 86  Post FEV1/FCV % % 84  FEV1-Pre L 2.31  FEV1-Predicted Pre % 104  FEV1-Post L 2.36  DLCO uncorrected ml/min/mmHg 24.54  DLCO UNC% % 123  DLCO corrected ml/min/mmHg 24.54  DLCO  COR %Predicted % 123  DLVA Predicted % 152  TLC L 4.25  TLC % Predicted % 89  RV % Predicted % 83    No results found for: NITRICOXIDE      Assessment & Plan:   Allergic reaction Allergic reaction questionable etiology.   Unable to identify any new medications or foods.  Patient's been asked to write down a food and chronic diary. Will begin short course of prednisone.  Depo-Medrol 80 mg injection given in office today albuterol nebulizer treatment given in the office.  Patient is to change Claritin to Zyrtec for 5 days.  Begin Pepcid for 5 days.  May use Benadryl as needed. EpiPen will be refilled.  Patient is advised that if she does not improve or symptoms worsen she is to contact us immediately and go to the emergency room  Plan  PLAN:  Patient Instructions  Change to Claritin to Zyrtec 10mg  daily for 5 days, then resume Claritin 10mg   Begin Pepcid 20mg  At bedtime  for 5 days  Depo Medrol shot today in office  Begin Prednisone taper tomorrow.  May take Benadryl 25mg  tonight .  Since BREO is not covered by [employer] , begin Dulera 100  2 puffs Twice daily ,. Rinse after use.  Albuterol inhaler As needed   Epi Pen refill , use for allergic reaction  Follow up in 1 week and As needed   Please contact office for sooner follow up if symptoms do not improve or worsen or seek emergency care          Intermittent asthma Mild exacerbation with allergic reaction. Patient is to begin [facility]  Unfortunately Breo was not covered.  Albuterol nebulizer given in the office today.  Prednisone taper over the next week Chest xray   Plan  Patient Instructions  Change to Claritin to Zyrtec 10mg  daily for 5 days, then resume Claritin 10mg   Begin Pepcid 20mg  At bedtime  for 5 days  Depo Medrol shot today in office  Begin Prednisone taper tomorrow.  May take Benadryl 25mg  tonight .  Since BREO is not covered by [employer] , begin Dulera 100  2 puffs Twice daily ,. Rinse after use.  Albuterol inhaler As needed   Epi Pen refill , use for allergic reaction  Follow up in 1 week and As needed   Please contact office for sooner follow up if symptoms do not improve or worsen or seek emergency care             Rexene Edison, NP 09/01/2021

## 2021-09-01 NOTE — Assessment & Plan Note (Addendum)
Mild exacerbation with allergic reaction. Patient is to begin [facility].  Unfortunately Breo was not covered.  Albuterol nebulizer given in the office today.  Prednisone taper over the next week Chest xray   Plan  Patient Instructions  Change to Claritin to Zyrtec 10mg  daily for 5 days, then resume Claritin 10mg   Begin Pepcid 20mg  At bedtime  for 5 days  Depo Medrol shot today in office  Begin Prednisone taper tomorrow.  May take Benadryl 25mg  tonight .  Since BREO is not covered by [employer] , begin Dulera 100  2 puffs Twice daily ,. Rinse after use.  Albuterol inhaler As needed   Epi Pen refill , use for allergic reaction  Follow up in 1 week and As needed   Please contact office for sooner follow up if symptoms do not improve or worsen or seek emergency care

## 2021-09-01 NOTE — Assessment & Plan Note (Signed)
Allergic reaction questionable etiology.  Unable to identify any new medications or foods.  Patient's been asked to write down a food and chronic diary. Will begin short course of prednisone.  Depo-Medrol 80 mg injection given in office today albuterol nebulizer treatment given in the office.  Patient is to change Claritin to Zyrtec for 5 days.  Begin Pepcid for 5 days.  May use Benadryl as needed. EpiPen will be refilled.  Patient is advised that if she does not improve or symptoms worsen she is to contact us immediately and go to the emergency room  Plan  PLAN:  Patient Instructions  Change to Claritin to Zyrtec 10mg  daily for 5 days, then resume Claritin 10mg   Begin Pepcid 20mg  At bedtime  for 5 days  Depo Medrol shot today in office  Begin Prednisone taper tomorrow.  May take Benadryl 25mg  tonight .  Since BREO is not covered by [employer] , begin Dulera 100  2 puffs Twice daily ,. Rinse after use.  Albuterol inhaler As needed   Epi Pen refill , use for allergic reaction  Follow up in 1 week and As needed   Please contact office for sooner follow up if symptoms do not improve or worsen or seek emergency care

## 2021-09-01 NOTE — Patient Instructions (Addendum)
Change to Claritin to Zyrtec 10mg  daily for 5 days, then resume Claritin 10mg   Begin Pepcid 20mg  At bedtime  for 5 days  Depo Medrol shot today in office  Begin Prednisone taper tomorrow.  May take Benadryl 25mg  tonight .  Since BREO is not covered by [employer] , begin Dulera 100  2 puffs Twice daily ,. Rinse after use.  Albuterol inhaler As needed   Epi Pen refill , use for allergic reaction  Follow up in 1 week and As needed   Please contact office for sooner follow up if symptoms do not improve or worsen or seek emergency care

## 2021-09-04 ENCOUNTER — Telehealth: Payer: Self-pay | Admitting: Pharmacy Technician

## 2021-09-04 ENCOUNTER — Other Ambulatory Visit (HOSPITAL_COMMUNITY): Payer: Self-pay

## 2021-09-04 NOTE — Telephone Encounter (Signed)
Patient Advocate Encounter   Received notification from [employer] that prior authorization for Epipen is required by his/her insurance [employer].  Per Test Claim: PA not required. $0 copay   Spoke with Pharmacy to process.

## 2021-09-13 ENCOUNTER — Encounter: Payer: Self-pay | Admitting: Internal Medicine

## 2021-09-13 ENCOUNTER — Other Ambulatory Visit: Payer: Self-pay

## 2021-09-13 ENCOUNTER — Ambulatory Visit (INDEPENDENT_AMBULATORY_CARE_PROVIDER_SITE_OTHER): Payer: Managed Care, Other (non HMO) | Admitting: Internal Medicine

## 2021-09-13 VITALS — BP 122/80 | HR 70 | Ht 62.0 in | Wt 173.6 lb

## 2021-09-13 DIAGNOSIS — J4520 Mild intermittent asthma, uncomplicated: Secondary | ICD-10-CM

## 2021-09-13 DIAGNOSIS — Z87892 Personal history of anaphylaxis: Secondary | ICD-10-CM | POA: Diagnosis not present

## 2021-09-13 NOTE — Patient Instructions (Signed)
Please schedule follow up scheduled with myself in 6 months.  If my schedule is not open yet, we will contact you with a reminder closer to that time. Please call [phone] if you haven't heard from [place] a month before.   sTart taking dulera again if you develop URI symptoms to help prevent an asthma exacerbation, or if you start feeling signs of chest tightness, wheezing, coughing.  By learning about asthma and how it can be controlled, you take an important step toward managing this disease. Work closely with your asthma care team to learn all you can about your asthma, how to avoid triggers, what your medications do, and how to take them correctly. With proper care, you can live free of asthma symptoms and maintain a normal, healthy lifestyle.   What is asthma? Asthma is a chronic disease that affects the airways of the lungs. During normal breathing, the bands of muscle that surround the airways are relaxed and air moves freely. During an asthma episode or "attack," there are three main changes that stop air from moving easily through the airways: The bands of muscle that surround the airways tighten and make the airways narrow. This tightening is called bronchospasm.  The lining of the airways becomes swollen or inflamed.  The cells that line the airways produce more mucus, which is thicker than normal and clogs the airways.  These three factors - bronchospasm, inflammation, and mucus production - cause symptoms such as difficulty breathing, wheezing, and coughing.  What are the most common symptoms of asthma? Asthma symptoms are not the same for everyone. They can even change from episode to episode in the same person. Also, you may have only one symptom of asthma, such as cough, but another person may have all the symptoms of asthma. It is important to know all the symptoms of asthma and to be aware that your asthma can present in any of these ways at any time. The most common symptoms  include: Coughing, especially at night  Shortness of breath  Wheezing  Chest tightness, pain, or pressure   Who is affected by asthma? Asthma affects 22 million Americans; about 6 million of these are children under age 81. People who have a family history of asthma have an increased risk of developing the disease. Asthma is also more common in people who have allergies or who are exposed to tobacco smoke. However, anyone can develop asthma at any time. Some people may have asthma all of their lives, while others may develop it as adults.  What causes asthma? The airways in a person with asthma are very sensitive and react to many things, or "triggers." Contact with these triggers causes asthma symptoms. One of the most important parts of asthma control is to identify your triggers and then avoid them when possible. The only trigger you do not want to avoid is exercise. Pre-treatment with medicines before exercise can allow you to stay active yet avoid asthma symptoms. Common asthma triggers include: Infections (colds, viruses, flu, sinus infections)  Exercise  Weather (changes in temperature and/or humidity, cold air)  Tobacco smoke  Allergens (dust mites, pollens, pets, mold spores, cockroaches, and sometimes foods)  Irritants (strong odors from cleaning products, perfume, wood smoke, air pollution)  Strong emotions such as crying or laughing hard  Some medications   How is asthma diagnosed? To diagnose asthma, your doctor will first review your medical history, family history, and symptoms. Your doctor will want to know any past history  of breathing problems you may have had, as well as a family history of asthma, allergies, eczema (a bumpy, itchy skin rash caused by allergies), or other lung disease. It is important that you describe your symptoms in detail (cough, wheeze, shortness of breath, chest tightness), including when and how often they occur. The doctor will perform a physical  examination and listen to your heart and lungs. He or she may also order breathing tests, allergy tests, blood tests, and chest and sinus X-rays. The tests will find out if you do have asthma and if there are any other conditions that are contributing factors.  How is asthma treated? Asthma can be controlled, but not cured. It is not normal to have frequent symptoms, trouble sleeping, or trouble completing tasks. Appropriate asthma care will prevent symptoms and visits to the emergency room and hospital. Asthma medicines are one of the mainstays of asthma treatment. The drugs used to treat asthma are explained below.  Anti-inflammatories: These are the most important drugs for most people with asthma. Anti-inflammatory drugs reduce swelling and mucus production in the airways. As a result, airways are less sensitive and less likely to react to triggers. These medications need to be taken daily and may need to be taken for several weeks before they begin to control asthma. Anti-inflammatory medicines lead to fewer symptoms, better airflow, less sensitive airways, less airway damage, and fewer asthma attacks. If taken every day, they CONTROL or prevent asthma symptoms.   Bronchodilators: These drugs relax the muscle bands that tighten around the airways. This action opens the airways, letting more air in and out of the lungs and improving breathing. Bronchodilators also help clear mucus from the lungs. As the airways open, the mucus moves more freely and can be coughed out more easily. In short-acting forms, bronchodilators RELIEVE or stop asthma symptoms by quickly opening the airways and are very helpful during an asthma episode. In long-acting forms, bronchodilators provide CONTROL of asthma symptoms and prevent asthma episodes.  Asthma drugs can be taken in a variety of ways. Inhaling the medications by using a metered dose inhaler, dry powder inhaler, or nebulizer is one way of taking asthma medicines.  Oral medicines (pills or liquids you swallow) may also be prescribed.  Asthma severity Asthma is classified as either "intermittent" (comes and goes) or "persistent" (lasting). Persistent asthma is further described as being mild, moderate, or severe. The severity of asthma is based on how often you have symptoms both during the day and night, as well as by the results of lung function tests and by how well you can perform activities. The "severity" of asthma refers to how "intense" or "strong" your asthma is.  Asthma control Asthma control is the goal of asthma treatment. Regardless of your asthma severity, it may or may not be controlled. Asthma control means: You are able to do everything you want to do at work and home  You have no (or minimal) asthma symptoms  You do not wake up from your sleep or earlier than usual in the morning due to asthma  You rarely need to use your reliever medicine (inhaler)  Another major part of your treatment is that you are happy with your asthma care and believe your asthma is controlled.  Monitoring symptoms A key part of treatment is keeping track of how well your lungs are working. Monitoring your symptoms  what they are, how and when they happen, and how severe they are  is an important part  of being able to control your asthma.  Sometimes asthma is monitored using a peak flow meter. A peak flow (PF) meter measures how fast the air comes out of your lungs. It can help you know when your asthma is getting worse, sometimes even before you have symptoms. By taking daily peak flow readings, you can learn when to adjust medications to keep asthma under good control. It is also used to create your asthma action plan (see below). Your doctor can use your peak flow readings to adjust your treatment plan in some cases.  Asthma Action Plan Based on your history and asthma severity, you and your doctor will develop a care plan called an asthma action plan. The  asthma action plan describes when and how to use your medicines, actions to take when asthma worsens, and when to seek emergency care. Make sure you understand this plan. If you do not, ask your asthma care provider any questions you may have. Your asthma action plan is one of the keys to controlling asthma. Keep it readily available to remind you of what you need to do every day to control asthma and what you need to do when symptoms occur.  Goals of asthma therapy These are the goals of asthma treatment: Live an active, normal life  Prevent chronic and troublesome symptoms  Attend work or school every day  Perform daily activities without difficulty  Stop urgent visits to the doctor, emergency department, or hospital  Use and adjust medications to control asthma with few or no side effects

## 2021-09-13 NOTE — Progress Notes (Signed)
Ashley Archer    [id number]    November 14, 1970  Primary Care Physician:Gosrani, Doristine Johns, MD (Inactive) Date of Appointment: 09/13/2021 Established Patient Visit  Chief complaint:   Chief Complaint  Patient presents with   Follow-up    Patient is feeling good, no concerns     HPI: Ashley Archer is a 50 y.o. woman with moderate persistent asthma. Previously followed by Dr Carlis Abbott. New to me today.   Interval Updates: Feeling better today.   Last week she had an episode of acute onset of shortness of breath, wheezing, swollen lips, face, itchy throat. Saw TP in office. Started prednisone and dulera. She stopped dulera because of concerns for glaucoma, so using as needed. She has anaphylaxis to cephalosporins in 2019/.  No childhood respiratory disease or asthma. She thinks she had covid in October 2020. Since then, When she gets a URI she feels breathing symptoms more consistently. Had it once in 2021 and took the albuterol and breo and that helped her breathing. She did not need prednisone.   She has previously done SCIT to dust mites, trees, grasses  I have reviewed the patient's family social and past medical history and updated as appropriate.  Never smoker, passive smoke exposure in childhood.   Past Medical History:  Diagnosis Date   Allergy    Anaphylaxis    Angioedema 02/11/2018   to cefdinir   CIN II (cervical intraepithelial neoplasia II)    Fibroid    GERD (gastroesophageal reflux disease)    Glaucoma    Intermittent asthma 08/07/2021   Perimenopause    Urticaria    Vitamin D deficiency disease     Past Surgical History:  Procedure Laterality Date   CERVICAL BIOPSY  W/ LOOP ELECTRODE EXCISION  2001   CIN ll   DILATION AND CURETTAGE OF UTERUS     IR ANGIOGRAM PELVIS SELECTIVE OR SUPRASELECTIVE  05/02/2021   IR ANGIOGRAM SELECTIVE EACH ADDITIONAL VESSEL  05/02/2021   IR ANGIOGRAM SELECTIVE EACH ADDITIONAL VESSEL  05/02/2021   IR EMBO TUMOR ORGAN  ISCHEMIA INFARCT INC GUIDE ROADMAPPING  05/02/2021   IR RADIOLOGIST EVAL & MGMT  01/18/2021   IR RADIOLOGIST EVAL & MGMT  05/17/2021   IR US GUIDE VASC ACCESS RIGHT  05/02/2021   [facility] Surg      Family History  Problem Relation Age of Onset   Hypertension Mother    Cancer Mother 73       cervical   Pancreatic cancer Father    Cancer Father 19       pancreatic   Diabetes Maternal Aunt    Diabetes Maternal Grandmother    Breast cancer Paternal Grandmother        under 19   Asthma Son     Social History   Occupational History   Not on file  Tobacco Use   Smoking status: Never   Smokeless tobacco: Never  Vaping Use   Vaping Use: Never used  Substance and Sexual Activity   Alcohol use: No   Drug use: No   Sexual activity: Yes    Birth control/protection: Condom     Physical Exam: Blood pressure 122/80, pulse 70, height 5\' 2"  (1.575 m), weight 173 lb 9.6 oz (78.7 kg), SpO2 98 %.  Gen:      No acute distress ENT:  no nasal polyps, mucus membranes moist Lungs:    No increased respiratory effort, symmetric chest wall excursion, clear to  auscultation bilaterally, no wheezes or crackles CV:         Regular rate and rhythm; no murmurs, rubs, or gallops.  No pedal edema   Data Reviewed: Imaging: I have personally reviewed the chest xray 12/2 no acute process  PFTs:  PFT Results Latest Ref Rng & Units 12/25/2019  FVC-Pre L 2.69  FVC-Predicted Pre % 98  FVC-Post L 2.82  FVC-Predicted Post % 102  Pre FEV1/FVC % % 86  Post FEV1/FCV % % 84  FEV1-Pre L 2.31  FEV1-Predicted Pre % 104  FEV1-Post L 2.36  DLCO uncorrected ml/min/mmHg 24.54  DLCO UNC% % 123  DLCO corrected ml/min/mmHg 24.54  DLCO COR %Predicted % 123  DLVA Predicted % 152  TLC L 4.25  TLC % Predicted % 89  RV % Predicted % 83   I have personally reviewed the patient's PFTs and normal pulmonary function.  Labs: IgE 50, elevated to birch, hickory, ragweed, elm Eosinophils not elevated  Immunization  status: Immunization History  Administered Date(s) Administered   Influenza Split 09/27/2011   Influenza,inj,Quad PF,6+ Mos 07/30/2012, 07/15/2019, 07/21/2021   PFIZER(Purple Top)SARS-COV-2 Vaccination 10/14/2019, 11/04/2019, 03/08/2021   Tdap 04/18/2020    Assessment:  Mild intermittent asthma History of anaphylaxis to cephalosporins Environmental allergies  Plan/Recommendations:  Ok to continue with prn ICS-LABA.  Cautioned her to start taking daily and scheduled for URI symptoms or worsening allergy symptoms.  She would like to minimize ICS-LABA due to personal history of glaucoma   Return to Care: Return in about 6 months (around 03/14/2022).   Lenice Llamas, MD Pulmonary and [place]

## 2021-10-31 ENCOUNTER — Other Ambulatory Visit: Payer: Self-pay | Admitting: Diagnostic Radiology

## 2021-10-31 ENCOUNTER — Other Ambulatory Visit: Payer: Self-pay

## 2021-10-31 DIAGNOSIS — D259 Leiomyoma of uterus, unspecified: Secondary | ICD-10-CM

## 2021-10-31 DIAGNOSIS — D250 Submucous leiomyoma of uterus: Secondary | ICD-10-CM

## 2021-10-31 DIAGNOSIS — Z0000 Encounter for general adult medical examination without abnormal findings: Secondary | ICD-10-CM

## 2021-11-01 ENCOUNTER — Other Ambulatory Visit: Payer: Self-pay | Admitting: Obstetrics and Gynecology

## 2021-11-01 DIAGNOSIS — N83202 Unspecified ovarian cyst, left side: Secondary | ICD-10-CM

## 2021-11-13 ENCOUNTER — Other Ambulatory Visit (HOSPITAL_COMMUNITY): Payer: Managed Care, Other (non HMO)

## 2021-11-14 ENCOUNTER — Ambulatory Visit
Admission: RE | Admit: 2021-11-14 | Discharge: 2021-11-14 | Disposition: A | Discharge status: Home / Self Care | Payer: Managed Care, Other (non HMO) | Source: Ambulatory Visit | Attending: Obstetrics and Gynecology | Admitting: Obstetrics and Gynecology

## 2021-11-14 ENCOUNTER — Other Ambulatory Visit: Payer: Self-pay

## 2021-11-14 DIAGNOSIS — N83202 Unspecified ovarian cyst, left side: Secondary | ICD-10-CM

## 2021-11-14 MED ORDER — GADOBENATE DIMEGLUMINE 529 MG/ML IV SOLN
15.0000 mL | Freq: Once | INTRAVENOUS | Status: AC | PRN
  Administered 2021-11-14: 15 mL via INTRAVENOUS

## 2021-11-16 ENCOUNTER — Encounter: Payer: Self-pay | Admitting: *Deleted

## 2021-11-16 ENCOUNTER — Other Ambulatory Visit: Payer: Self-pay

## 2021-11-16 ENCOUNTER — Telehealth: Payer: Managed Care, Other (non HMO)

## 2021-11-16 ENCOUNTER — Ambulatory Visit
Admission: RE | Admit: 2021-11-16 | Discharge: 2021-11-16 | Disposition: A | Discharge status: Home / Self Care | Payer: Managed Care, Other (non HMO) | Source: Ambulatory Visit | Attending: Diagnostic Radiology | Admitting: Diagnostic Radiology

## 2021-11-16 DIAGNOSIS — D250 Submucous leiomyoma of uterus: Secondary | ICD-10-CM

## 2021-11-16 HISTORY — PX: IR RADIOLOGIST EVAL & MGMT: IMG5224

## 2021-11-16 NOTE — Progress Notes (Signed)
Chief Complaint: Patient was consulted remotely today (TeleHealth) for follow-up after uterine artery embolization.    Referring Physician(s): McComb, John  History of Present Illness: Ashley Archer is a 51 y.o. female with numerous uterine fibroids and heavy menstrual bleeding.  History of hysteroscopic fibroid resection in 2019.  Patient underwent uterine artery embolization procedure on 05/02/2021.  Patient recently had a follow-up pelvic MRI.  Patient's menstrual bleeding has been irregular since the uterine artery embolization procedure.  She had 1 month without bleeding then the next month was very heavy bleeding.  She had significant gas bloating immediately following the procedure.  The bleeding has decreased over the last 3 menstrual periods.  She is still having heavy bleeding on days 3, 4 and 5 which require frequent pad changes but no large clotting like she was having previously.  The cramping is much better than it was prior to the procedure.  Her menstrual periods are lasting anywhere between 7 and 14 days.  She has urinary frequency but says that she drinks a lot of water during the day.  She has been struggling with constipation as well.  Past Medical History:  Diagnosis Date   Allergy    Anaphylaxis    Angioedema 02/11/2018   to cefdinir   CIN II (cervical intraepithelial neoplasia II)    Fibroid    GERD (gastroesophageal reflux disease)    Glaucoma    Intermittent asthma 08/07/2021   Perimenopause    Urticaria    Vitamin D deficiency disease     Past Surgical History:  Procedure Laterality Date   CERVICAL BIOPSY  W/ LOOP ELECTRODE EXCISION  2001   CIN ll   DILATION AND CURETTAGE OF UTERUS     IR ANGIOGRAM PELVIS SELECTIVE OR SUPRASELECTIVE  05/02/2021   IR ANGIOGRAM SELECTIVE EACH ADDITIONAL VESSEL  05/02/2021   IR ANGIOGRAM SELECTIVE EACH ADDITIONAL VESSEL  05/02/2021   IR EMBO TUMOR ORGAN ISCHEMIA INFARCT INC GUIDE ROADMAPPING  05/02/2021   IR RADIOLOGIST EVAL  & MGMT  01/18/2021   IR RADIOLOGIST EVAL & MGMT  05/17/2021   IR US GUIDE VASC ACCESS RIGHT  05/02/2021   [facility]      Allergies: Cefdinir, Benzonatate, Coconut oil, and Hydrocodone  Medications: Prior to Admission medications   Medication Sig Start Date End Date Taking? Authorizing Provider  albuterol (VENTOLIN HFA) 108 (90 Base) MCG/ACT inhaler Inhale 2 puffs into the lungs every 4 (four) hours as needed for wheezing or shortness of breath. 09/01/21   Parrett, Fonnie Mu, NP  Ascorbic Acid (VITAMIN C WITH ROSE HIPS) 500 MG tablet Take 500 mg by mouth daily.    [provider]  carboxymethylcellulose (REFRESH PLUS) 0.5 % SOLN Place 1 drop into both eyes daily as needed (dry eyes).    [provider]  Cholecalciferol (VITAMIN D-3) 125 MCG (5000 UT) TABS Take 10,000 Units by mouth daily.     [provider]  docusate sodium (COLACE) 100 MG capsule Take 1 capsule (100 mg total) by mouth 2 (two) times daily as needed for mild constipation. 05/03/21   Allred, Darrell K, PA-C  EPINEPHrine 0.3 mg/0.3 mL IJ SOAJ injection INJECT 0.3 MLS INTO THE MUSCLE ONCE AS NEEDED FOR 1 DOSE (SEVERE ALLERGIC REACTION) 09/01/21   Parrett, Fonnie Mu, NP  ibuprofen (ADVIL) 200 MG tablet Take 200 mg by mouth every 6 (six) hours as needed for mild pain.    [provider]  loratadine (CLARITIN) 10 MG tablet Take 10  mg by mouth daily as needed for allergies.    [provider]  mometasone-formoterol (DULERA) 100-5 MCG/ACT AERO Inhale 2 puffs into the lungs 2 (two) times daily. 09/01/21   Parrett, Fonnie Mu, NP  ondansetron (ZOFRAN) 8 MG tablet Take 1 tablet (8 mg total) by mouth every 8 (eight) hours as needed for nausea or vomiting. 05/03/21   Allred, Darrell K, PA-C  predniSONE (DELTASONE) 10 MG tablet 4 tabs for 2 days, then 3 tabs for 2 days, 2 tabs for 2 days, then 1 tab for 2 days, then stop 09/01/21   Parrett, Tammy S, NP  progesterone (PROMETRIUM) 200 MG capsule Take 1 capsule (200  mg total) by mouth at bedtime. 11/03/20   Gosrani, Nimish C, MD  Tafluprost, PF, (ZIOPTAN) 0.0015 % SOLN Place 1 drop into both eyes daily.    [provider]  valACYclovir (VALTREX) 500 MG tablet Take 500 mg by mouth daily as needed (breakouts). 03/31/21   [provider]  vitamin E 400 UNIT capsule Take 400 Units by mouth daily.    [provider]     Family History  Problem Relation Age of Onset   Hypertension Mother    Cancer Mother 67       cervical   Pancreatic cancer Father    Cancer Father 85       pancreatic   Diabetes Maternal Aunt    Diabetes Maternal Grandmother    Breast cancer Paternal Grandmother        under 79   Asthma Son     Social History   Socioeconomic History   Marital status: Married    Spouse name: Not on file   Number of children: Not on file   Years of education: Not on file   Highest education level: Not on file  Occupational History   Not on file  Tobacco Use   Smoking status: Never   Smokeless tobacco: Never  Vaping Use   Vaping Use: Never used  Substance and Sexual Activity   Alcohol use: No   Drug use: No   Sexual activity: Yes    Birth control/protection: Condom  Other Topics Concern   Not on file  Social History Narrative   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   Social Determinants of Health   Financial Resource Strain: Not on file  Food Insecurity: Not on file  Transportation Needs: Not on file  Physical Activity: Not on file  Stress: Not on file  Social Connections: Not on file     Review of Systems  Gastrointestinal:  Positive for constipation.  Genitourinary:  Positive for frequency, menstrual problem and pelvic pain.    Physical Exam No direct physical exam was performed   Vital Signs: There were no vitals taken for this visit.  Imaging: MR PELVIS W WO CONTRAST  Result Date: 11/15/2021 CLINICAL DATA:  Fibroid uterus, status post  embolization EXAM: MRI PELVIS WITHOUT AND WITH CONTRAST TECHNIQUE: Multiplanar multisequence MR imaging of the pelvis was performed both before and after administration of intravenous contrast. CONTRAST:  7mL MULTIHANCE GADOBENATE DIMEGLUMINE 529 MG/ML IV SOLN COMPARISON:  None. FINDINGS: Urinary Tract:  Urinary bladder and urethra appear normal. Bowel:  Visualized bowel is unremarkable. Vascular/Lymphatic: No pathologically enlarged lymph nodes. No significant vascular abnormality seen. Reproductive: Uterus is enlarged and lobulated measuring 9 x 8.6 x 17.6 cm in AP, transverse and fundal cervical dimensions. Myometrium is heterogeneous with numerous (greater than 20) uterine fibroids which are  predominantly hypointense on T2 with varying degrees of T1 hyperintensity likely representing hemorrhagic components. Several of the uterine fibroids do not demonstrate postcontrast enhancement, the largest of which measure 4.5 x 4.7 x 4.4 cm subserosal at the fundus, 3.7 x 4.3 x 3.9 cm subserosal/intramural anteriorly, 3.3 x 3.6 x 3.5 cm right posterior intramural. A few submucosal fibroids which also do not demonstrate postcontrast enhancement, largest measuring 1.8 cm and 1.5 cm. A few fibroids demonstrate persistent diffuse or nearly diffuse postcontrast enhancement, largest of which are at the anterior lower uterine segment subserosal measuring 3.7 x 3.2 x 3.4 cm near the midline and 3.5 x 3.1 x 3.2 cm on the left. Endometrium is distorted from fibroids and appears to be otherwise normal thickness with no suspicious mass identified. Junctional zone is ill-defined. Cervical stroma demonstrates normal hypointense T2 signal with no suspicious mass identified. Vaginal canal is unremarkable. 3.2 cm simple appearing left ovarian cyst. Right ovary is not definitely visualized. Other: Trace amount of free fluid in the dependent pelvis, most likely physiologic. Musculoskeletal: Degenerative changes at L5-S1. No suspicious bony  lesions identified. IMPRESSION: Enlarged lobulated fibroid uterus with numerous fibroids as described. Most of the fibroids do not demonstrate any significant enhancement. There are a few enhancing fibroids remaining as described. Electronically Signed   By: Ofilia Neas M.D.   On: 11/15/2021 14:56    Labs:  CBC: Recent Labs    05/02/21 1233  WBC 4.0  HGB 14.1  HCT 43.0  PLT 133*    COAGS: Recent Labs    05/02/21 1233  INR 1.0    BMP: Recent Labs    05/02/21 1233  NA 137  K 3.9  CL 104  CO2 25  GLUCOSE 96  BUN 16  CALCIUM 9.6  CREATININE 0.73  GFRNONAA >60    LIVER FUNCTION TESTS: No results for input(s): BILITOT, AST, ALT, ALKPHOS, PROT, ALBUMIN in the last 8760 hours.  TUMOR MARKERS: No results for input(s): AFPTM, CEA, CA199, CHROMGRNA in the last 8760 hours.  Assessment and Plan:  51 year old with uterine fibroids and menorrhagia.  Patient underwent uterine artery embolization procedure on 05/02/2021.  The first few months after the procedure were difficult for the patient due to irregular heavy bleeding and abdominal bloating.  She is a little disappointed with the procedure outcome because she was expecting less bleeding at this point.  However, the patient has noticed decreased cramping and the menstrual bleeding is less than it was prior to the procedure.  Patient recently had a pelvic MRI.  I compared this MRI with the MRI performed at [facility] on 03/25/2021.  The post embolization MRI demonstrates that majority of the uterine fibroids are infarcted.  However, there are 2 or 3 fibroids in the lower uterine segment which demonstrate residual contrast enhancement.  By my measurements, the largest enhancing fibroid along the lower uterine segment on the left side measures 3.8 x 3.3 cm and previously measured 5.2 x 4.4 cm.  There is another residual enhancing fibroid in the midline of the lower uterine segment that measures 3.8 x 3.5 cm and previous measured  4.0 x 4.1 cm.  The dominant fundal fibroid measures 4.9 x 3.9 cm and previously measured 5.9 x 6.3 cm.  Difficult to compare overall size of the uterus based on these 2 exams.  Explained to the patient that it is disappointing that there is residual blood flow to fibroids in the lower uterine segment but it is encouraging that these fibroids have  decreased in size.  Again, majority of the fibroids do not have significant enhancement.  I am also encouraged that the patient's bleeding has decreased since her procedure although not as much as the patient would desire.  In addition, the patient's abdominal cramping has decreased since the procedure.  Explained to the patient that the uterine fibroids will likely continue to shrink over time and hopefully her symptoms will continue to improve.  One of the fibroids in the lower uterine segment is compressing on the urinary bladder and this is likely contributing to her urinary frequency.  We also discussed trying different medications for constipation such as MiraLAX rather than Colace.  We will plan for another follow-up visit in 6 months to reassess the patient's symptoms.  Thank you for this interesting consult.  I greatly enjoyed meeting Ashley Archer and look forward to participating in their care.  A copy of this report was sent to the requesting provider on this date.  Electronically Signed: Burman Riis 11/16/2021, 3:41 PM   I spent a total of    10 Minutes in remote  clinical consultation, greater than 50% of which was counseling/coordinating care for uterine fibroids and menorrhagia.    Visit type: Audio only (telephone). Audio (no video) only due to patient preference. Alternative for in-person consultation at [facility], [address] [place], [place], [place]. This visit type was conducted due to national recommendations for restrictions regarding the COVID-19 Pandemic (e.g. social distancing).  This format is felt to be most appropriate for  this patient at this time.  All issues noted in this document were discussed and addressed.   Patient ID: Ashley Archer, female   DOB: 04-Nov-1970, 51 y.o.   MRN: [id number]

## 2022-01-04 DIAGNOSIS — H401124 Primary open-angle glaucoma, left eye, indeterminate stage: Secondary | ICD-10-CM | POA: Diagnosis not present

## 2022-01-15 DIAGNOSIS — H401131 Primary open-angle glaucoma, bilateral, mild stage: Secondary | ICD-10-CM | POA: Diagnosis not present

## 2022-01-15 DIAGNOSIS — H04123 Dry eye syndrome of bilateral lacrimal glands: Secondary | ICD-10-CM | POA: Diagnosis not present

## 2022-03-14 DIAGNOSIS — H401124 Primary open-angle glaucoma, left eye, indeterminate stage: Secondary | ICD-10-CM | POA: Diagnosis not present

## 2022-03-14 DIAGNOSIS — H401132 Primary open-angle glaucoma, bilateral, moderate stage: Secondary | ICD-10-CM | POA: Diagnosis not present

## 2022-03-15 DIAGNOSIS — Z13228 Encounter for screening for other metabolic disorders: Secondary | ICD-10-CM | POA: Diagnosis not present

## 2022-03-15 DIAGNOSIS — Z01419 Encounter for gynecological examination (general) (routine) without abnormal findings: Secondary | ICD-10-CM | POA: Diagnosis not present

## 2022-03-15 DIAGNOSIS — Z124 Encounter for screening for malignant neoplasm of cervix: Secondary | ICD-10-CM | POA: Diagnosis not present

## 2022-03-15 DIAGNOSIS — Z1329 Encounter for screening for other suspected endocrine disorder: Secondary | ICD-10-CM | POA: Diagnosis not present

## 2022-03-15 DIAGNOSIS — Z6832 Body mass index (BMI) 32.0-32.9, adult: Secondary | ICD-10-CM | POA: Diagnosis not present

## 2022-03-15 DIAGNOSIS — Z1151 Encounter for screening for human papillomavirus (HPV): Secondary | ICD-10-CM | POA: Diagnosis not present

## 2022-03-15 DIAGNOSIS — Z131 Encounter for screening for diabetes mellitus: Secondary | ICD-10-CM | POA: Diagnosis not present

## 2022-03-16 ENCOUNTER — Other Ambulatory Visit: Payer: Self-pay | Admitting: Obstetrics and Gynecology

## 2022-03-16 DIAGNOSIS — Z800 Family history of malignant neoplasm of digestive organs: Secondary | ICD-10-CM

## 2022-03-21 NOTE — Progress Notes (Unsigned)
Cardiology Office Note:    Date:  03/21/2022   ID:  Ashley Archer, DOB [birth year], MRN [id number]  PCP:  Doree Albee, MD (Inactive)  Cardiologist:  Donato Heinz, MD  Electrophysiologist:  None   Referring MD: No ref. provider found   Chief Complaint: follow-up of atypical chest pain  History of Present Illness:    Ashley Archer is a 51 y.o. female with a history of atypical chest pain, GERD, asthma, and glaucoma who is followed by Dr. Gardiner Rhyme and presents today for routine follow-up.   Patient was referred to Dr. Gardiner Rhyme in 12/2019 for further evaluation for chest pain that she described as a cramping behind her left breast that would come and and last for about 10 minutes. She also reported persistent shortness of breath on exertion since COVID infection in 12/2018. ETT and Echo were ordered for further evaluation. Echo showed LVEF of 60-65% with mild LVH, normal diastolic parameters, and no significant valvular disease. Patient never had ETT done because she was unable to quarantine for 3 days prior to study. However, at follow-up visit in 01/2020 she denied any further chest pain so decision was made to hold off on ETT.  Patient was last seen by Almyra Deforest, PA-C, in 03/2021 at which time she was doing well. She denied any recent chest discomfort. She reported some occasional dyspnea on exertion but attributed this to bronchitis.   Patient presents today for follow-up. ***  History of Atypical Chest Pain Echo in 2021 showed normal LV function with no regional wall motion abnormalities.  - ***  Past Medical History:  Diagnosis Date   Allergy    Anaphylaxis    Angioedema 02/11/2018   to cefdinir   CIN II (cervical intraepithelial neoplasia II)    Fibroid    GERD (gastroesophageal reflux disease)    Glaucoma    Intermittent asthma 08/07/2021   Perimenopause    Urticaria    Vitamin D deficiency disease     Past Surgical History:  Procedure Laterality Date    CERVICAL BIOPSY  W/ LOOP ELECTRODE EXCISION  2001   CIN ll   DILATION AND CURETTAGE OF UTERUS     IR ANGIOGRAM PELVIS SELECTIVE OR SUPRASELECTIVE  05/02/2021   IR ANGIOGRAM SELECTIVE EACH ADDITIONAL VESSEL  05/02/2021   IR ANGIOGRAM SELECTIVE EACH ADDITIONAL VESSEL  05/02/2021   IR EMBO TUMOR ORGAN ISCHEMIA INFARCT INC GUIDE ROADMAPPING  05/02/2021   IR RADIOLOGIST EVAL & MGMT  01/18/2021   IR RADIOLOGIST EVAL & MGMT  05/17/2021   IR RADIOLOGIST EVAL & MGMT  11/16/2021   IR US GUIDE VASC ACCESS RIGHT  05/02/2021   [facility] Surg      Current Medications: No outpatient medications have been marked as taking for the 03/27/22 encounter (Appointment) with Darreld Mclean, PA-C.     Allergies:   Cefdinir, Benzonatate, Coconut (cocos nucifera), and Hydrocodone   Social History   Socioeconomic History   Marital status: Married    Spouse name: Not on file   Number of children: Not on file   Years of education: Not on file   Highest education level: Not on file  Occupational History   Not on file  Tobacco Use   Smoking status: Never   Smokeless tobacco: Never  Vaping Use   Vaping Use: Never used  Substance and Sexual Activity   Alcohol use: No   Drug use: No   Sexual activity: Yes    Birth control/protection: Condom  Other Topics Concern   Not on file  Social History Narrative   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   Social Determinants of Health   Financial Resource Strain: Not on file  Food Insecurity: Not on file  Transportation Needs: Not on file  Physical Activity: Not on file  Stress: Not on file  Social Connections: Not on file     Family History: The patient's family history includes Asthma in her son; Breast cancer in her paternal grandmother; Cancer (age of onset: 65) in her father; Cancer (age of onset: 68) in her mother; Diabetes in her maternal aunt and maternal grandmother; Hypertension in her mother; Pancreatic  cancer in her father.  ROS:   Please see the history of present illness.     EKGs/Labs/Other Studies Reviewed:    The following studies were reviewed:  Echocardiogram 01/26/2020: Impressions: 1. Left ventricular ejection fraction, by estimation, is 60 to 65%. The  left ventricle has normal function. The left ventricle has no regional  wall motion abnormalities. There is mild left ventricular hypertrophy.  Left ventricular diastolic parameters  were normal.   2. Right ventricular systolic function is normal. The right ventricular  size is normal. There is normal pulmonary artery systolic pressure.   3. The mitral valve is grossly normal. Trivial mitral valve  regurgitation.   4. The aortic valve is tricuspid. Aortic valve regurgitation is not  visualized.   5. The inferior vena cava is normal in size with greater than 50%  respiratory variability, suggesting right atrial pressure of 3 mmHg.  EKG:  EKG ordered today. EKG personally reviewed and demonstrates ***.  Recent Labs: 05/02/2021: BUN 16; Creatinine, Ser 0.73; Hemoglobin 14.1; Platelets 133; Potassium 3.9; Sodium 137  Recent Lipid Panel    Component Value Date/Time   CHOL 179 11/15/2020 1121   TRIG 63 11/15/2020 1121   HDL 82 11/15/2020 1121   CHOLHDL 2.2 11/15/2020 1121   LDLCALC 83 11/15/2020 1121    Physical Exam:    Vital Signs: There were no vitals taken for this visit.    Wt Readings from Last 3 Encounters:  09/13/21 173 lb 9.6 oz (78.7 kg)  09/01/21 173 lb 6.4 oz (78.7 kg)  08/07/21 169 lb 6.4 oz (76.8 kg)     General: 51 y.o. female in no acute distress. HEENT: Normocephalic and atraumatic. Sclera clear. EOMs intact. Neck: Supple. No carotid bruits. No JVD. Heart: *** RRR. Distinct S1 and S2. No murmurs, gallops, or rubs. Radial and distal pedal pulses 2+ and equal bilaterally. Lungs: No increased work of breathing. Clear to ausculation bilaterally. No wheezes, rhonchi, or rales.  Abdomen: Soft,  non-distended, and non-tender to palpation. Bowel sounds present in all 4 quadrants.  MSK: Normal strength and tone for age. *** Extremities: No lower extremity edema.    Skin: Warm and dry. Neuro: Alert and oriented x3. No focal deficits. Psych: Normal affect. Responds appropriately.   Assessment:    No diagnosis found.  Plan:     Disposition: Follow up in ***   Medication Adjustments/Labs and Tests Ordered: Current medicines are reviewed at length with the patient today.  Concerns regarding medicines are outlined above.  No orders of the defined types were placed in this encounter.  No orders of the defined types were placed in this encounter.   There are no Patient Instructions on file for this visit.   Signed, Darreld Mclean, PA-C  03/21/2022 2:02 PM    [facility] 

## 2022-03-27 ENCOUNTER — Encounter: Payer: Self-pay | Admitting: Student

## 2022-03-27 ENCOUNTER — Ambulatory Visit (INDEPENDENT_AMBULATORY_CARE_PROVIDER_SITE_OTHER): Payer: BC Managed Care – PPO | Admitting: Student

## 2022-03-27 VITALS — BP 124/70 | HR 54 | Ht 62.0 in | Wt 175.4 lb

## 2022-03-27 DIAGNOSIS — R0609 Other forms of dyspnea: Secondary | ICD-10-CM

## 2022-03-27 DIAGNOSIS — R0789 Other chest pain: Secondary | ICD-10-CM

## 2022-03-27 DIAGNOSIS — R002 Palpitations: Secondary | ICD-10-CM | POA: Diagnosis not present

## 2022-03-31 ENCOUNTER — Other Ambulatory Visit: Payer: Self-pay

## 2022-04-04 ENCOUNTER — Other Ambulatory Visit: Payer: Self-pay

## 2022-04-10 ENCOUNTER — Other Ambulatory Visit: Payer: Self-pay | Admitting: Diagnostic Radiology

## 2022-04-10 DIAGNOSIS — D250 Submucous leiomyoma of uterus: Secondary | ICD-10-CM

## 2022-04-17 DIAGNOSIS — M25562 Pain in left knee: Secondary | ICD-10-CM | POA: Diagnosis not present

## 2022-05-03 ENCOUNTER — Ambulatory Visit
Admission: RE | Admit: 2022-05-03 | Discharge: 2022-05-03 | Disposition: A | Discharge status: Home / Self Care | Payer: BC Managed Care – PPO | Source: Ambulatory Visit | Attending: Diagnostic Radiology | Admitting: Diagnostic Radiology

## 2022-05-03 ENCOUNTER — Encounter: Payer: Self-pay | Admitting: *Deleted

## 2022-05-03 DIAGNOSIS — D259 Leiomyoma of uterus, unspecified: Secondary | ICD-10-CM | POA: Diagnosis not present

## 2022-05-03 DIAGNOSIS — D250 Submucous leiomyoma of uterus: Secondary | ICD-10-CM

## 2022-05-03 DIAGNOSIS — S83222D Peripheral tear of medial meniscus, current injury, left knee, subsequent encounter: Secondary | ICD-10-CM | POA: Diagnosis not present

## 2022-05-03 DIAGNOSIS — N921 Excessive and frequent menstruation with irregular cycle: Secondary | ICD-10-CM | POA: Diagnosis not present

## 2022-05-03 DIAGNOSIS — Z9889 Other specified postprocedural states: Secondary | ICD-10-CM | POA: Diagnosis not present

## 2022-05-03 HISTORY — PX: IR RADIOLOGIST EVAL & MGMT: IMG5224

## 2022-05-03 NOTE — Progress Notes (Signed)
Chief Complaint: Patient was consulted remotely today (TeleHealth) for follow-up uterine artery embolization.  Referring Physician(s): McComb, John  History of Present Illness: Ashley Archer is a 51 y.o. female with uterine fibroids and heavy menstrual bleeding.  History of hysteroscopic fibroid resection in 2019 and bilateral uterine artery embolization on 05/02/2021.  I last spoke with the patient on 11/16/2021.  Patient was still having irregular heavy bleeding and abdominal bloating at the time of her last visit.  Postprocedure MRI demonstrated that majority of the uterine fibroids were infarcted but there were still 2-3 fibroids in the lower uterine segment that had residual contrast-enhancement but decreased in size.  Currently, the patient's menstrual periods are lasting 7 days with 3 days of heavy bleeding. The heavy bleeding has decreased by 2 days since the procedure.  Overall, her menstrual periods are regular although this current period is 2 days delayed.  Patient is still experiencing premenstrual symptoms including cramping.  Patient was recently seen by Dr. Radene Knee and they discussed additional treatments for her residual heavy menstrual bleeding.  She does not remember the details but it sounds like they were discussing different hormonal therapies.  Patient is not currently taking any hormonal therapy.  No significant change in her overall health.  Past Medical History:  Diagnosis Date   Allergy    Anaphylaxis    Angioedema 02/11/2018   to cefdinir   CIN II (cervical intraepithelial neoplasia II)    Fibroid    GERD (gastroesophageal reflux disease)    Glaucoma    Intermittent asthma 08/07/2021   Perimenopause    Urticaria    Vitamin D deficiency disease     Past Surgical History:  Procedure Laterality Date   CERVICAL BIOPSY  W/ LOOP ELECTRODE EXCISION  2001   CIN ll   DILATION AND CURETTAGE OF UTERUS     IR ANGIOGRAM PELVIS SELECTIVE OR SUPRASELECTIVE  05/02/2021    IR ANGIOGRAM SELECTIVE EACH ADDITIONAL VESSEL  05/02/2021   IR ANGIOGRAM SELECTIVE EACH ADDITIONAL VESSEL  05/02/2021   IR EMBO TUMOR ORGAN ISCHEMIA INFARCT INC GUIDE ROADMAPPING  05/02/2021   IR RADIOLOGIST EVAL & MGMT  01/18/2021   IR RADIOLOGIST EVAL & MGMT  05/17/2021   IR RADIOLOGIST EVAL & MGMT  11/16/2021   IR US GUIDE VASC ACCESS RIGHT  05/02/2021   [facility] S[facility]      Allergies: Cefdinir, Benzonatate, Coconut (cocos nucifera), and Hydrocodone  Medications: Prior to Admission medications   Medication Sig Start Date End Date Taking? Authorizing Provider  albuterol (VENTOLIN HFA) 108 (90 Base) MCG/ACT inhaler Inhale 2 puffs into the lungs every 4 (four) hours as needed for wheezing or shortness of breath. 09/01/21   Parrett, Fonnie Mu, NP  Ascorbic Acid (VITAMIN C WITH ROSE HIPS) 500 MG tablet Take 500 mg by mouth daily.    [provider]  brimonidine (ALPHAGAN) 0.2 % ophthalmic solution Apply to eye.    [provider]  carboxymethylcellulose (REFRESH PLUS) 0.5 % SOLN Place 1 drop into both eyes daily as needed (dry eyes).    [provider]  Cholecalciferol (VITAMIN D-3) 125 MCG (5000 UT) TABS Take 10,000 Units by mouth daily.     [provider]  docusate sodium (COLACE) 100 MG capsule Take 1 capsule (100 mg total) by mouth 2 (two) times daily as needed for mild constipation. 05/03/21   Allred, Darrell K, PA-C  EPINEPHrine 0.3 mg/0.3 mL IJ SOAJ injection INJECT 0.3 MLS INTO THE MUSCLE ONCE AS NEEDED  FOR 1 DOSE (SEVERE ALLERGIC REACTION) 09/01/21   Parrett, Fonnie Mu, NP  ibuprofen (ADVIL) 200 MG tablet Take 200 mg by mouth every 6 (six) hours as needed for mild pain.    [provider]  loratadine (CLARITIN) 10 MG tablet Take 10 mg by mouth daily as needed for allergies.    [provider]  mometasone-formoterol (DULERA) 100-5 MCG/ACT AERO Inhale 2 puffs into the lungs 2 (two) times daily. 09/01/21   Parrett, Fonnie Mu, NP  ondansetron (ZOFRAN) 8 MG  tablet Take 1 tablet (8 mg total) by mouth every 8 (eight) hours as needed for nausea or vomiting. 05/03/21   Allred, Darrell K, PA-C  progesterone (PROMETRIUM) 200 MG capsule Take 1 capsule (200 mg total) by mouth at bedtime. 11/03/20   Gosrani, Nimish C, MD  Tafluprost, PF, (ZIOPTAN) 0.0015 % SOLN Place 1 drop into both eyes daily.    [provider]  valACYclovir (VALTREX) 500 MG tablet Take 500 mg by mouth daily as needed (breakouts). 03/31/21   [provider]  vitamin E 400 UNIT capsule Take 400 Units by mouth daily.    [provider]     Family History  Problem Relation Age of Onset   Hypertension Mother    Cancer Mother 32       cervical   Pancreatic cancer Father    Cancer Father 81       pancreatic   Diabetes Maternal Aunt    Diabetes Maternal Grandmother    Breast cancer Paternal Grandmother        under 28   Asthma Son     Social History   Socioeconomic History   Marital status: Married    Spouse name: Not on file   Number of children: Not on file   Years of education: Not on file   Highest education level: Not on file  Occupational History   Not on file  Tobacco Use   Smoking status: Never   Smokeless tobacco: Never  Vaping Use   Vaping Use: Never used  Substance and Sexual Activity   Alcohol use: No   Drug use: No   Sexual activity: Yes    Birth control/protection: Condom  Other Topics Concern   Not on file  Social History Narrative   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   Social Determinants of Health   Financial Resource Strain: Not on file  Food Insecurity: Not on file  Transportation Needs: Not on file  Physical Activity: Not on file  Stress: Not on file  Social Connections: Not on file    Review of Systems  Constitutional: Negative.   Genitourinary:  Positive for menstrual problem and pelvic pain.      Physical Exam No direct physical exam was performed    Vital Signs: There were no vitals taken for this visit.  Imaging: No results found.  Labs:  CBC: No results for input(s): "WBC", "HGB", "HCT", "PLT" in the last 8760 hours.  COAGS: No results for input(s): "INR", "APTT" in the last 8760 hours.  BMP: No results for input(s): "NA", "K", "CL", "CO2", "GLUCOSE", "BUN", "CALCIUM", "CREATININE", "GFRNONAA", "GFRAA" in the last 8760 hours.  Invalid input(s): "CMP"  LIVER FUNCTION TESTS: No results for input(s): "BILITOT", "AST", "ALT", "ALKPHOS", "PROT", "ALBUMIN" in the last 8760 hours.  TUMOR MARKERS: No results for input(s): "AFPTM", "CEA", "CA199", "CHROMGRNA" in the last 8760 hours.  Assessment and Plan:  51 year old with history of uterine fibroids and menorrhagia.  Patient underwent uterine artery embolization on 05/02/2021.  The patient's heavy menstrual bleeding has decreased since the procedure but she is clearly a little disappointed that she is still having 3 days of heavy flow.  At this point, she does not want to undergo any additional procedures or surgeries for the uterine fibroids and heavy menstrual bleeding.  She is considering hormonal or oral therapy that she has discussed with her gynecologist Dr. Radene Knee.  I reviewed the patient's MRI from 11/14/2021 and there is residual fibroid enhancement near the lower uterine segment but majority of the fibroids were infarcted.  The junctional zone was poorly characterized on the MRI and hard to know if the patient has underlying adenomyosis.  We could consider a repeat angiogram if she was motivated to do additional therapies but she is not interested at this time.  Patient will continue to follow-up with her gynecologist and she can follow-up with Interventional Radiology as needed.   Electronically Signed: Burman Riis 05/03/2022, 9:34 AM   I spent a total of    5 Minutes in remote  clinical consultation, greater than 50% of which was counseling/coordinating care for uterine  fibroids.    Visit type: Audio only (telephone). Audio (no video) only due to technical limitations. Alternative for in-person consultation at [facility], [address], [place], [place]. This visit type was conducted due to national recommendations for restrictions regarding the COVID-19 Pandemic (e.g. social distancing).  This format is felt to be most appropriate for this patient at this time.  All issues noted in this document were discussed and addressed.   Patient ID: Ashley Archer, female   DOB: Mar 19, 1971, 51 y.o.   MRN: [id number]

## 2022-05-18 DIAGNOSIS — N393 Stress incontinence (female) (male): Secondary | ICD-10-CM | POA: Diagnosis not present

## 2022-05-18 DIAGNOSIS — R351 Nocturia: Secondary | ICD-10-CM | POA: Diagnosis not present

## 2022-06-13 DIAGNOSIS — M62838 Other muscle spasm: Secondary | ICD-10-CM | POA: Diagnosis not present

## 2022-06-13 DIAGNOSIS — M6289 Other specified disorders of muscle: Secondary | ICD-10-CM | POA: Diagnosis not present

## 2022-06-13 DIAGNOSIS — R350 Frequency of micturition: Secondary | ICD-10-CM | POA: Diagnosis not present

## 2022-06-13 DIAGNOSIS — M6281 Muscle weakness (generalized): Secondary | ICD-10-CM | POA: Diagnosis not present

## 2022-06-14 DIAGNOSIS — H401124 Primary open-angle glaucoma, left eye, indeterminate stage: Secondary | ICD-10-CM | POA: Diagnosis not present

## 2022-08-08 ENCOUNTER — Encounter: Payer: Self-pay | Admitting: Obstetrics and Gynecology

## 2022-08-08 ENCOUNTER — Other Ambulatory Visit: Payer: Self-pay | Admitting: Obstetrics and Gynecology

## 2022-08-08 DIAGNOSIS — Z800 Family history of malignant neoplasm of digestive organs: Secondary | ICD-10-CM

## 2022-08-08 DIAGNOSIS — Z1231 Encounter for screening mammogram for malignant neoplasm of breast: Secondary | ICD-10-CM

## 2022-08-14 ENCOUNTER — Encounter: Payer: Self-pay | Admitting: Adult Health

## 2022-08-14 ENCOUNTER — Ambulatory Visit: Payer: BC Managed Care – PPO | Admitting: Adult Health

## 2022-08-14 VITALS — BP 120/80 | HR 72 | Temp 98.3°F | Ht 62.0 in | Wt 176.6 lb

## 2022-08-14 DIAGNOSIS — J4520 Mild intermittent asthma, uncomplicated: Secondary | ICD-10-CM | POA: Diagnosis not present

## 2022-08-14 DIAGNOSIS — J3089 Other allergic rhinitis: Secondary | ICD-10-CM | POA: Diagnosis not present

## 2022-08-14 NOTE — Assessment & Plan Note (Signed)
Claritin As needed

## 2022-08-14 NOTE — Patient Instructions (Signed)
Dulera 100  2 puffs Twice daily ,. Rinse after use. - with asthma flare  Albuterol inhaler As needed   Claritin '10mg'$  daily As needed   Saline nasal spray and gel As needed   Follow up in 1 year with Dr. Shearon Stalls or Tkai Large NP and  As needed   Please contact office for sooner follow up if symptoms do not improve or worsen or seek emergency care

## 2022-08-14 NOTE — Progress Notes (Signed)
$'@Patient'D$  ID: Ashley Archer, female    DOB: Jun 08, 1971, 51 y.o.   MRN: [id number]  Chief Complaint  Patient presents with   Follow-up    Referring provider: No ref. provider found  HPI: 51 year old female never smoker followed for asthma and allergic rhinitis  TEST/EVENTS :  12/25/2019 PFT - FVC 2.82 (102%), FEV1 2.36 (106%), ratio 84, 2% BD, TLC 89%, DLCOcor 24.54 (123%)/ Normal spirometry. No bronchodilator response. Increased diffusion capacity.   IgE 50, elevated to birch, hickory, ragweed, elm Eosinophils not elevated  2D echo Takeria 27, 2021 EF at 60 to 65%, normal pulmonary artery systolic pressure    Imaging: 08/26/2019 CXR- No acute cardiopulmonary disease, lungs clear   08/14/2022 Follow up: Asthma and Allergic Rhinitis Patient presents for 1 year follow-up.  Patient has underlying asthma and allergic rhinitis.  Patient says she has been doing well up until 2 weeks ago.  Got cold-like symptoms with mild asthma flare.  Patient says she started her [facility] and used it for a few days which seemed to take care of her symptoms well.  She also used Claritin for a few days as well.  Symptoms are back to normal .  Cough and tightness have resolved.  She denies any fever, chest pain, orthopnea, edema. She says she did have one episode of some hives on her back and she took Benadryl which resolved.     Allergies  Allergen Reactions   Cefdinir Anaphylaxis and Shortness Of Breath   Benzonatate Hives and Itching    Pt developed  itching and  welt-like bumps on her  face, chest, back and arms.  She also developed eye burning . No worsening shortness of breath or lip/ tongue swelling. Symptoms resolved within 36 hours with immediate discontinuation of the medication and  Benadryl and Zyrtec po    Coconut (Cocos Nucifera) Swelling and Rash   Hydrocodone Rash    Immunization History  Administered Date(s) Administered   Influenza Split 09/27/2011   Influenza,inj,Quad PF,6+ Mos  07/30/2012, 07/15/2019, 07/21/2021, 06/29/2022   PFIZER(Purple Top)SARS-COV-2 Vaccination 10/14/2019, 11/04/2019, 03/08/2021   Tdap 04/18/2020    Past Medical History:  Diagnosis Date   Allergy    Anaphylaxis    Angioedema 02/11/2018   to cefdinir   CIN II (cervical intraepithelial neoplasia II)    Fibroid    GERD (gastroesophageal reflux disease)    Glaucoma    Intermittent asthma 08/07/2021   Perimenopause    Urticaria    Vitamin D deficiency disease     Tobacco History: Social History   Tobacco Use  Smoking Status Never  Smokeless Tobacco Never   Counseling given: Not Answered   Outpatient Medications Prior to Visit  Medication Sig Dispense Refill   albuterol (VENTOLIN HFA) 108 (90 Base) MCG/ACT inhaler Inhale 2 puffs into the lungs every 4 (four) hours as needed for wheezing or shortness of breath. 18 g 5   Ascorbic Acid (VITAMIN C WITH ROSE HIPS) 500 MG tablet Take 500 mg by mouth daily.     brimonidine (ALPHAGAN) 0.2 % ophthalmic solution Apply to eye.     carboxymethylcellulose (REFRESH PLUS) 0.5 % SOLN Place 1 drop into both eyes daily as needed (dry eyes).     Cholecalciferol (VITAMIN D-3) 125 MCG (5000 UT) TABS Take 10,000 Units by mouth daily.      docusate sodium (COLACE) 100 MG capsule Take 1 capsule (100 mg total) by mouth 2 (two) times daily as needed for mild constipation. 10 capsule 0  EPINEPHrine 0.3 mg/0.3 mL IJ SOAJ injection INJECT 0.3 MLS INTO THE MUSCLE ONCE AS NEEDED FOR 1 DOSE (SEVERE ALLERGIC REACTION) 2 each 1   ibuprofen (ADVIL) 200 MG tablet Take 200 mg by mouth every 6 (six) hours as needed for mild pain.     loratadine (CLARITIN) 10 MG tablet Take 10 mg by mouth daily as needed for allergies.     mometasone-formoterol (DULERA) 100-5 MCG/ACT AERO Inhale 2 puffs into the lungs 2 (two) times daily. 1 each 5   ondansetron (ZOFRAN) 8 MG tablet Take 1 tablet (8 mg total) by mouth every 8 (eight) hours as needed for nausea or vomiting. 20 tablet 0    progesterone (PROMETRIUM) 200 MG capsule Take 1 capsule (200 mg total) by mouth at bedtime. 90 capsule 1   Tafluprost, PF, (ZIOPTAN) 0.0015 % SOLN Place 1 drop into both eyes daily.     valACYclovir (VALTREX) 500 MG tablet Take 500 mg by mouth daily as needed (breakouts).     vitamin E 400 UNIT capsule Take 400 Units by mouth daily.     No facility-administered medications prior to visit.     Review of Systems:   Constitutional:   No  weight loss, night sweats,  Fevers, chills, fatigue, or  lassitude.  HEENT:   No headaches,  Difficulty swallowing,  Tooth/dental problems, or  Sore throat,                No sneezing, itching, ear ache,  +nasal congestion, post nasal drip,   CV:  No chest pain,  Orthopnea, PND, swelling in lower extremities, anasarca, dizziness, palpitations, syncope.   GI  No heartburn, indigestion, abdominal pain, nausea, vomiting, diarrhea, change in bowel habits, loss of appetite, bloody stools.   Resp:  No chest wall deformity  Skin: no rash or lesions.  GU: no dysuria, change in color of urine, no urgency or frequency.  No flank pain, no hematuria   MS:  No joint pain or swelling.  No decreased range of motion.  No back pain.    Physical Exam  BP 120/80 (BP Location: Left Arm, Patient Position: Sitting, Cuff Size: Normal)   Pulse 72   Temp 98.3 F (36.8 C) (Oral)   Ht '5\' 2"'$  (1.575 m)   Wt 176 lb 9.6 oz (80.1 kg)   SpO2 96%   BMI 32.30 kg/m   GEN: A/Ox3; pleasant , NAD, well nourished    HEENT:  [place]/AT,   NOSE-clear drainage , THROAT-clear, no lesions, no postnasal drip or exudate noted.   NECK:  Supple w/ fair ROM; no JVD; normal carotid impulses w/o bruits; no thyromegaly or nodules palpated; no lymphadenopathy.    RESP  Clear  P & A; w/o, wheezes/ rales/ or rhonchi. no accessory muscle use, no dullness to percussion  CARD:  RRR, no m/r/g, no peripheral edema, pulses intact, no cyanosis or clubbing.  GI:   Soft & nt; nml bowel sounds; no  organomegaly or masses detected.   Musco: Warm bil, no deformities or joint swelling noted.   Neuro: alert, no focal deficits noted.    Skin: Warm, no lesions or rashes    Lab Results:   BMET   BNP No results found for: "BNP"  ProBNP No results found for: "PROBNP"  Imaging: No results found.       Latest Ref Rng & Units 12/25/2019    1:02 PM  PFT Results  FVC-Pre L 2.69   FVC-Predicted Pre % 98  FVC-Post L 2.82   FVC-Predicted Post % 102   Pre FEV1/FVC % % 86   Post FEV1/FCV % % 84   FEV1-Pre L 2.31   FEV1-Predicted Pre % 104   FEV1-Post L 2.36   DLCO uncorrected ml/min/mmHg 24.54   DLCO UNC% % 123   DLCO corrected ml/min/mmHg 24.54   DLCO COR %Predicted % 123   DLVA Predicted % 152   TLC L 4.25   TLC % Predicted % 89   RV % Predicted % 83     No results found for: "NITRICOXIDE"      Assessment & Plan:   Asthma Mild intermittent asthma-we discussed asthma action plan.  Patient has done well without maintenance inhaler she did start her Dulera during recent asthma flare.  This seemed to work well for her.  In the going forward could consider Airsupra .  Patient has a history of glaucoma and does not want to be maintained on an ICS long-term  Plan  Patient Instructions  Dulera 100  2 puffs Twice daily ,. Rinse after use. - with asthma flare  Albuterol inhaler As needed   Claritin '10mg'$  daily As needed   Saline nasal spray and gel As needed   Follow up in 1 year with Dr. Shearon Stalls or Kaya Pottenger NP and  As needed   Please contact office for sooner follow up if symptoms do not improve or worsen or seek emergency care        Seasonal allergic rhinitis Claritin As needed       Rexene Edison, NP 08/14/2022

## 2022-08-14 NOTE — Assessment & Plan Note (Signed)
Mild intermittent asthma-we discussed asthma action plan.  Patient has done well without maintenance inhaler she did start her Dulera during recent asthma flare.  This seemed to work well for her.  In the going forward could consider Airsupra .  Patient has a history of glaucoma and does not want to be maintained on an ICS long-term  Plan  Patient Instructions  Dulera 100  2 puffs Twice daily ,. Rinse after use. - with asthma flare  Albuterol inhaler As needed   Claritin '10mg'$  daily As needed   Saline nasal spray and gel As needed   Follow up in 1 year with Dr. Shearon Stalls or Alan Riles NP and  As needed   Please contact office for sooner follow up if symptoms do not improve or worsen or seek emergency care

## 2022-08-16 ENCOUNTER — Ambulatory Visit
Admission: RE | Admit: 2022-08-16 | Discharge: 2022-08-16 | Disposition: A | Discharge status: Home / Self Care | Payer: BC Managed Care – PPO | Source: Ambulatory Visit | Attending: Obstetrics and Gynecology | Admitting: Obstetrics and Gynecology

## 2022-08-16 DIAGNOSIS — Z1231 Encounter for screening mammogram for malignant neoplasm of breast: Secondary | ICD-10-CM

## 2022-09-22 ENCOUNTER — Other Ambulatory Visit: Payer: BC Managed Care – PPO

## 2022-10-04 ENCOUNTER — Ambulatory Visit
Admission: RE | Admit: 2022-10-04 | Discharge: 2022-10-04 | Disposition: A | Discharge status: Home / Self Care | Payer: 59 | Source: Ambulatory Visit | Attending: Obstetrics and Gynecology | Admitting: Obstetrics and Gynecology

## 2022-10-04 DIAGNOSIS — Z800 Family history of malignant neoplasm of digestive organs: Secondary | ICD-10-CM

## 2022-10-04 MED ORDER — GADOBENATE DIMEGLUMINE 529 MG/ML IV SOLN
16.0000 mL | Freq: Once | INTRAVENOUS | Status: AC | PRN
  Administered 2022-10-04: 16 mL via INTRAVENOUS

## 2022-10-09 ENCOUNTER — Ambulatory Visit: Payer: BC Managed Care – PPO

## 2023-02-12 ENCOUNTER — Other Ambulatory Visit: Payer: Self-pay | Admitting: Adult Health

## 2023-02-13 ENCOUNTER — Telehealth: Payer: Self-pay | Admitting: Internal Medicine

## 2023-02-13 NOTE — Telephone Encounter (Signed)
DULERA 100-5 MCG/ACT AERO   [employer] cover for the dulera, and Pt wants to know if there Is something else we can send ?

## 2023-02-13 NOTE — Telephone Encounter (Signed)
Please run a ticket on patients insurance to see what's covered?  Please route back to triage

## 2023-02-14 ENCOUNTER — Other Ambulatory Visit (HOSPITAL_COMMUNITY): Payer: Self-pay

## 2023-02-14 NOTE — Telephone Encounter (Signed)
Per test claims covered alternatives are:  W[employer]/generic Advair Diskus-$50.00 Brand Symbicort-$50.00 Breo-$50.00 Advair HFA-$50.00

## 2023-02-15 MED ORDER — FLUTICASONE-SALMETEROL 115-21 MCG/ACT IN AERO: 2.0000 | INHALATION_SPRAY | Freq: Two times a day (BID) | RESPIRATORY_TRACT | 5 refills | Status: DC

## 2023-02-15 NOTE — Telephone Encounter (Signed)
All steroid inhalers for asthma have similar risk for worsening intra-ocular pressure.  This risk increase with higher doses and long term daily use.  My impression was that she is only using on an as needed basis when she has a flare of her asthma symptoms. As long as that's the case then use, advair should be safe. Of course if she notices worsening glaucoma symptoms she should discontinue and let us know.

## 2023-02-15 NOTE — Telephone Encounter (Signed)
I spoke with patient. She had forgot to mention this before. She is wanting to know if Advair is safe for her to use since she has Glaucoma?

## 2023-02-18 NOTE — Telephone Encounter (Signed)
Spoke with patient went over recommendations from Dr. Celine Mans. Patient verbalized understanding. NFN

## 2023-03-12 NOTE — Telephone Encounter (Signed)
Dr. Celine Mans, this message was received for you this afternoon.  I have checked GoodRx and prices on Advair diskus are a little cheaper at $45 at [facility].    Hello, thank you all for continuing to work with me to find a covered and affordable replacement medication for [facility]. The Advair prescription you recently called into my Pharmacy is not affordable at this time. In speaking with my insurance company they suggested I reach back out to you to inquire about two other meds: Fluticasone and Symbicort.  Are these available in an inhaler? Please advise as I continue to have persistent symptoms but do not have any of this medication on hand. Thank you kindly, Ashley Archer    Per test claims covered alternatives are:   Wixela/generic Advair Diskus-$50.00 Brand Symbicort-$50.00 Breo-$50.00 Advair HFA-$50.00

## 2023-03-15 MED ORDER — BUDESONIDE-FORMOTEROL FUMARATE 80-4.5 MCG/ACT IN AERO: 2.0000 | INHALATION_SPRAY | Freq: Two times a day (BID) | RESPIRATORY_TRACT | 12 refills | Status: DC

## 2023-03-21 ENCOUNTER — Other Ambulatory Visit: Payer: Self-pay | Admitting: Obstetrics and Gynecology

## 2023-03-21 DIAGNOSIS — E042 Nontoxic multinodular goiter: Secondary | ICD-10-CM

## 2023-04-05 ENCOUNTER — Other Ambulatory Visit: Payer: Self-pay

## 2023-04-16 ENCOUNTER — Other Ambulatory Visit: Payer: Self-pay

## 2023-04-23 ENCOUNTER — Ambulatory Visit
Admission: RE | Admit: 2023-04-23 | Discharge: 2023-04-23 | Discharge status: Home / Self Care | Payer: 59 | Source: Ambulatory Visit | Attending: Obstetrics and Gynecology | Admitting: Obstetrics and Gynecology

## 2023-04-23 DIAGNOSIS — E042 Nontoxic multinodular goiter: Secondary | ICD-10-CM

## 2023-10-14 ENCOUNTER — Other Ambulatory Visit: Payer: Self-pay | Admitting: Obstetrics and Gynecology

## 2023-10-14 DIAGNOSIS — Z1231 Encounter for screening mammogram for malignant neoplasm of breast: Secondary | ICD-10-CM

## 2023-10-15 ENCOUNTER — Ambulatory Visit
Admission: RE | Admit: 2023-10-15 | Discharge: 2023-10-15 | Discharge status: Home / Self Care | Payer: 59 | Source: Ambulatory Visit | Attending: Obstetrics and Gynecology | Admitting: Obstetrics and Gynecology

## 2023-10-15 DIAGNOSIS — Z1231 Encounter for screening mammogram for malignant neoplasm of breast: Secondary | ICD-10-CM

## 2024-01-01 ENCOUNTER — Encounter (HOSPITAL_BASED_OUTPATIENT_CLINIC_OR_DEPARTMENT_OTHER): Payer: Self-pay

## 2024-01-01 ENCOUNTER — Emergency Department (HOSPITAL_BASED_OUTPATIENT_CLINIC_OR_DEPARTMENT_OTHER)
Admission: EM | Admit: 2024-01-01 | Discharge: 2024-01-01 | Disposition: A | Discharge status: Home / Self Care | Attending: Emergency Medicine | Admitting: Emergency Medicine

## 2024-01-01 ENCOUNTER — Other Ambulatory Visit: Payer: Self-pay

## 2024-01-01 DIAGNOSIS — T7840XA Allergy, unspecified, initial encounter: Secondary | ICD-10-CM | POA: Diagnosis present

## 2024-01-01 MED ORDER — ACETAMINOPHEN 325 MG PO TABS
650.0000 mg | ORAL_TABLET | Freq: Once | ORAL | Status: AC
  Administered 2024-01-01: 650 mg via ORAL
  Filled 2024-01-01: qty 2

## 2024-01-01 MED ORDER — METHYLPREDNISOLONE SODIUM SUCC 125 MG IJ SOLR
125.0000 mg | Freq: Once | INTRAMUSCULAR | Status: AC
  Administered 2024-01-01: 125 mg via INTRAVENOUS
  Filled 2024-01-01: qty 2

## 2024-01-01 MED ORDER — ONDANSETRON HCL 4 MG/2ML IJ SOLN
4.0000 mg | Freq: Once | INTRAMUSCULAR | Status: AC
  Administered 2024-01-01: 4 mg via INTRAVENOUS
  Filled 2024-01-01: qty 2

## 2024-01-01 MED ORDER — FAMOTIDINE 20 MG PO TABS
20.0000 mg | ORAL_TABLET | Freq: Once | ORAL | Status: AC
  Administered 2024-01-01: 20 mg via ORAL
  Filled 2024-01-01: qty 1

## 2024-01-01 MED ORDER — PREDNISONE 10 MG PO TABS: 40.0000 mg | ORAL_TABLET | Freq: Every day | ORAL | 0 refills | Status: AC

## 2024-01-01 MED ORDER — EPINEPHRINE 0.3 MG/0.3ML IJ SOAJ: 0.3000 mg | INTRAMUSCULAR | 1 refills | Status: AC | PRN

## 2024-01-01 MED ORDER — LORATADINE 10 MG PO TABS
10.0000 mg | ORAL_TABLET | Freq: Once | ORAL | Status: AC
  Administered 2024-01-01: 10 mg via ORAL
  Filled 2024-01-01: qty 1

## 2024-01-01 NOTE — ED Provider Notes (Signed)
 [place] [facility]ERGENC[facility] DEPARTMENT AT [facility] Provider Note   CSN: [id number] Arrival date & time: 01/01/24  4098     History Chief Complaint  Patient presents with   Allergic Reaction    Ashley Archer is a 53 y.o. female.  Patient with past history significant for allergic reactions, angioedema, anaphylaxis presents emergency department with concerns of possible allergic reaction.  She reports that she ate seafood last night and had some notable shortness of breath with sensation of throat closing as well as hives.  She states that this all improved last night after administration of Benadryl and famotidine but woke up this morning with worsening shortness of breath.  She is also endorsing some pain from her left low back into the left leg.  She denies any leg swelling.  No history of chronic back pain.  She self administered a dose of EpiPen prior to arriving due to the feelings of shortness of breath and states that she now feels better after this medication.   Allergic Reaction      Home Medications Prior to Admission medications   Medication Sig Start Date End Date Taking? Authorizing Provider  EPINEPHrine 0.3 mg/0.3 mL IJ SOAJ injection Inject 0.3 mg into the muscle as needed for anaphylaxis. 01/01/24  Yes Smitty Knudsen, PA-C  predniSONE (DELTASONE) 10 MG tablet Take 4 tablets (40 mg total) by mouth daily for 5 days. 01/01/24 01/06/24 Yes Smitty Knudsen, PA-C  albuterol (VENTOLIN HFA) 108 (90 Base) MCG/ACT inhaler Inhale 2 puffs into the lungs every 4 (four) hours as needed for wheezing or shortness of breath. 09/01/21   Parrett, Virgel Bouquet, NP  Ascorbic Acid (VITAMIN C WITH ROSE HIPS) 500 MG tablet Take 500 mg by mouth daily.    [provider]  brimonidine (ALPHAGAN) 0.2 % ophthalmic solution Apply to eye.    [provider]  budesonide-formoterol (SYMBICORT) 80-4.5 MCG/ACT inhaler Inhale 2 puffs into the lungs in the morning and at bedtime. 03/15/23    Charlott Holler, MD  carboxymethylcellulose (REFRESH PLUS) 0.5 % SOLN Place 1 drop into both eyes daily as needed (dry eyes).    [provider]  Cholecalciferol (VITAMIN D-3) 125 MCG (5000 UT) TABS Take 10,000 Units by mouth daily.     [provider]  docusate sodium (COLACE) 100 MG capsule Take 1 capsule (100 mg total) by mouth 2 (two) times daily as needed for mild constipation. 05/03/21   Allred, Darrell K, PA-C  EPINEPHrine 0.3 mg/0.3 mL IJ SOAJ injection INJECT 0.3 MLS INTO THE MUSCLE ONCE AS NEEDED FOR 1 DOSE (SEVERE ALLERGIC REACTION) 09/01/21   Parrett, Virgel Bouquet, NP  fluticasone-salmeterol (ADVAIR HFA) 115-21 MCG/ACT inhaler Inhale 2 puffs into the lungs 2 (two) times daily. 02/15/23   Charlott Holler, MD  ibuprofen (ADVIL) 200 MG tablet Take 200 mg by mouth every 6 (six) hours as needed for mild pain.    [provider]  loratadine (CLARITIN) 10 MG tablet Take 10 mg by mouth daily as needed for allergies.    [provider]  ondansetron (ZOFRAN) 8 MG tablet Take 1 tablet (8 mg total) by mouth every 8 (eight) hours as needed for nausea or vomiting. 05/03/21   Allred, Darrell K, PA-C  progesterone (PROMETRIUM) 200 MG capsule Take 1 capsule (200 mg total) by mouth at bedtime. 11/03/20   Gosrani, Nimish C, MD  Tafluprost, PF, (ZIOPTAN) 0.0015 % SOLN Place 1 drop into both eyes daily.    [provider]  valACYclovir (VALTREX) 500 MG tablet Take 500 mg by mouth daily as needed (breakouts). 03/31/21   [provider]  vitamin E 400 UNIT capsule Take 400 Units by mouth daily.    [provider]      Allergies    Cefdinir, Clindamycin/lincomycin, Benzonatate, Cosopt [dorzolamide hcl-timolol mal], Dilaudid [hydromorphone hcl], Coconut (cocos nucifera), and Hydrocodone    Review of Systems   Review of Systems  Respiratory:  Positive for shortness of breath.   All other systems reviewed and are negative.   Physical Exam Updated Vital  Signs BP 127/84   Pulse 60   Temp 98.6 F (37 C) (Oral)   Resp 16   Ht 5\' 2"  (1.575 m)   Wt 77.1 kg   SpO2 100%   BMI 31.09 kg/m  Physical Exam Vitals and nursing note reviewed.  Constitutional:      General: She is not in acute distress.    Appearance: She is well-developed.  HENT:     Head: Normocephalic and atraumatic.  Eyes:     Conjunctiva/sclera: Conjunctivae normal.  Cardiovascular:     Rate and Rhythm: Normal rate and regular rhythm.     Heart sounds: No murmur heard. Pulmonary:     Effort: Pulmonary effort is normal. No respiratory distress.     Breath sounds: Normal breath sounds. No wheezing or rales.  Abdominal:     Palpations: Abdomen is soft.     Tenderness: There is no abdominal tenderness.  Musculoskeletal:        General: No swelling.     Cervical back: Neck supple.  Skin:    General: Skin is warm and dry.     Capillary Refill: Capillary refill takes less than 2 seconds.     Findings: No erythema or rash.     Comments: No notable hives  Neurological:     Mental Status: She is alert.  Psychiatric:        Mood and Affect: Mood normal.     ED Results / Procedures / Treatments   Labs (all labs ordered are listed, but only abnormal results are displayed) Labs Reviewed - No data to display  EKG EKG Interpretation Date/Time:  Wednesday Jaleya 02 2025 09:05:27 EDT Ventricular Rate:  77 PR Interval:  183 QRS Duration:  94 QT Interval:  350 QTC Calculation: 396 R Axis:   85  Text Interpretation: Sinus rhythm Baseline wander in lead(s) II aVF V4 V5 V6 No significant change since last tracing Confirmed by Elayne Snare ([id number]) on 01/01/2024 9:41:13 AM  Radiology No results found.  Procedures Procedures    Medications Ordered in ED Medications  loratadine (CLARITIN) tablet 10 mg (10 mg Oral Given 01/01/24 0925)  famotidine (PEPCID) tablet 20 mg (20 mg Oral Given 01/01/24 0925)  ondansetron (ZOFRAN) injection 4 mg (4 mg Intravenous Given 01/01/24  0924)  methylPREDNISolone sodium succinate (SOLU-MEDROL) 125 mg/2 mL injection 125 mg (125 mg Intravenous Given 01/01/24 0921)  acetaminophen (TYLENOL) tablet 650 mg (650 mg Oral Given 01/01/24 4098)    ED Course/ Medical Decision Making/ A&P                                 Medical Decision Making Risk OTC drugs. Prescription drug management.   This patient presents to the ED for concern of allergic reaction.  Differential diagnosis includes angioedema, anaphylaxis, food sensitivity, gastroenteritis   Medicines ordered and prescription drug  management:  I ordered medication including Claritin, Pepcid, Zofran, Solu-Medrol for allergic reaction, nausea Reevaluation of the patient after these medicines showed that the patient improved I have reviewed the patients home medicines and have made adjustments as needed   Problem List / ED Course:  Patient with past history significant for notable allergic reactions presents emergency department today with concerns for possible allergic reaction.  She states that she ate oysters last night and sometime after began to experience feelings of throat tightness, closing, shortness of breath.  She also did note some hives.  States that she took Pepcid and Benadryl with improvement overnight.  States she woke up this morning with worsening shortness of breath.  For this reason, she administered dose of EpiPen prior to arriving.  At this time she feels stable with no increased work of breathing. Physical exam with patient is reassuring.  There is no audible wheezing or increased respiratory effort.  Patient is alert and oriented at baseline.  Of note, patient's vitals shows some hypertension which is reassuring in the setting as this may be secondary to dependent ministration and more reassuring than hypotension.  There is no oropharyngeal swelling, erythema, or exudates present.  Will administer combination medications for allergic reaction and observe  patient for several hours with routine reassessments to ensure no relapse into allergic reaction. After a total observation period of approximately 4 hours, patient has had no recurrence in her symptoms.  No evidence of anaphylaxis at this time.  I unclear the exact source of patient's allergic reaction at this time but could potentially be due to the oysters that patient ate.  Advised patient to continue use of steroids at home for the next few days to ensure that symptoms do not recur.  A refill patient's EpiPen has been sent to her pharmacy for use if needed.  Return precautions discussed.  Patient otherwise stable for outpatient follow-up and discharged home.   Final Clinical Impression(s) / ED Diagnoses Final diagnoses:  Allergic reaction, initial encounter    Rx / DC Orders ED Discharge Orders          Ordered    EPINEPHrine 0.3 mg/0.3 mL IJ SOAJ injection  As needed        01/01/24 1231    predniSONE (DELTASONE) 10 MG tablet  Daily        01/01/24 1231              Smitty Knudsen, PA-C 01/01/24 1448    Rexford Maus, DO 01/02/24 [phone]

## 2024-01-01 NOTE — Discharge Instructions (Addendum)
 You are seen in the emerged department today for concerns of an allergic reaction.  Since he gave yourself a dose of your EpiPen prior to arriving, you are given antihistamines and steroids here in the ER.  I have sent a refill of your EpiPen to your pharmacy as well as a short course of steroids to continue taking at home.  Please follow-up with your primary care provider for repeat evaluation return the emergency department for any concerns of new or worsening symptoms.

## 2024-01-01 NOTE — ED Notes (Signed)
Reviewed discharge instructions, medications, and home care with pt. Pt verbalized understanding and had no further questions. Pt exited ED without complications.

## 2024-01-01 NOTE — ED Triage Notes (Signed)
 States has allergic reaction.  Took epi pen just before arrival here.States started last night then this am worse with short of breath and throat closing.  Hives.  To room via Wheel Chair Resp WNL  Lungs clear

## 2024-01-24 ENCOUNTER — Other Ambulatory Visit (HOSPITAL_COMMUNITY): Payer: Self-pay

## 2024-04-02 ENCOUNTER — Other Ambulatory Visit: Payer: Self-pay | Admitting: Obstetrics and Gynecology

## 2024-04-02 DIAGNOSIS — Z800 Family history of malignant neoplasm of digestive organs: Secondary | ICD-10-CM

## 2024-04-12 NOTE — Progress Notes (Unsigned)
 New Patient Note  RE: Ashley Archer MRN: [id number] DOB: Ashley Archer 17, 1972 Date of Office Visit: 04/13/2024  Consult requested by: No ref. provider found Primary care provider: Patient, No Pcp Per  Chief Complaint: Establish Care (Needs consultation about allergy because she had allergy injection many years ago. )  History of Present Illness: I had the pleasure of seeing Ashley Archer for initial evaluation at the [facility] on 04/13/2024. She is a 53 y.o. female, who is self-referred here for the evaluation of establish care.  Patient was last seen in our clinic in 2019 for idiopathic urticaria, angioedema, allergic rhinoconjunctivitis.  Discussed the use of AI scribe software for clinical note transcription with the patient, who gave verbal consent to proceed.    This year, she developed new allergies to seafood and nuts. Initially, she experienced mild reactions such as nausea and throat itching after eating hush puppies at a seafood restaurant, despite not consuming any seafood. A more severe reaction occurred after consuming oysters and shrimp, leading to throat closure almost immediately. She used an EpiPen  2 days later as she had rebound symptoms and went to the ER.   Two months later, she had another reaction after eating whiting fish and hush puppies at a seafood restaurant, despite the food being prepared separately. Symptoms included nausea and throat thickening, which resolved by the end of the night after taking Benadryl . In early June, she developed a reaction to peanuts, experiencing chest tightness, shortness of breath, nausea, and headache, treated with Benadryl  and Pepcid . Similar reactions to tree nuts like almonds and cashews were noted, primarily causing shortness of breath.  She has a history of idiopathic hives, often triggered by fragrances or outdoor activities, managed with daily Allegra or Zyrtec. She also has asthma diagnosed after a second bout  with COVID, using albuterol  as needed, particularly during allergic reactions, and Symbicort  sporadically. Multiple medication allergies. Milder reactions to coconut and cocoa butter. She avoids bread due to digestive issues but does not have an allergic reaction to it.  No family history of asthma. She is not currently working, has no pets, and is non-smokers. No recent fevers, chills, or significant breathing issues outside of allergic reactions. No issues with dairy, eggs, soy, or most fruits and vegetables. Occasionally experiences heartburn or reflux.     She does have access to epinephrine  autoinjector and needed to use it.   Dietary History: patient has been eating other foods including milk, eggs, limited sesame, soy, wheat, meats, fruits and vegetables.  She reports reading labels and avoiding seafood in diet completely.   01/01/2024 ER visit: Ashley Archer is a 53 y.o. female.  Patient with past history significant for allergic reactions, angioedema, anaphylaxis presents emergency department with concerns of possible allergic reaction.  She reports that she ate seafood last night and had some notable shortness of breath with sensation of throat closing as well as hives.  She states that this all improved last night after administration of Benadryl  and famotidine  but woke up this morning with worsening shortness of breath.  She is also endorsing some pain from her left low back into the left leg.  She denies any leg swelling.  No history of chronic back pain.  She self administered a dose of EpiPen  prior to arriving due to the feelings of shortness of breath and states that she now feels better after this medication.  I ordered medication including Claritin , Pepcid , Zofran , Solu-Medrol  for allergic reaction, nausea Reevaluation  of the patient after these medicines showed that the patient improved  Assessment and Plan: Ashley Archer is a 53 y.o. female with: Anaphylactic reaction due to food,  subsequent encounter Recent severe allergic reactions to seafood and nuts, including anaphylaxis. Possible cross-contamination and cross-reactivity considered. History of EpiPen  use and ER visit noted. Start strict avoidance of seafood, peanuts, tree nuts, coconut.  For mild symptoms you can take over the counter antihistamines (zyrtec 10mg  to 20mg ) and monitor symptoms closely.  If symptoms worsen or if you have severe symptoms including breathing issues, throat closure, significant swelling, whole body hives, severe diarrhea and vomiting, lightheadedness then use epinephrine  and seek immediate medical care afterwards. Emergency action plan given. Get bloodwork If negative will need skin testing next.  Other allergic rhinitis 2019 skin testing positive to grass, weed, trees, ragweed. Start environmental control measures as below. Use over the counter antihistamines such as Zyrtec (cetirizine), Claritin  (loratadine ), Allegra (fexofenadine), or Xyzal  (levocetirizine) daily as needed. May take twice a day during allergy flares. May switch antihistamines every few months. Consider allergy injections for long term control if above medications do not help the symptoms.  Nasal saline spray (i.e., Simply Saline) or nasal saline lavage (i.e., NeilMed) is recommended as needed and prior to medicated nasal sprays.  Urticaria Intermittent hives triggered by fragrances and outdoor exposure, managed with daily antihistamines. Avoid the following potential triggers: alcohol, tight clothing, NSAIDs, hot showers and getting overheated. See below for proper skin care.  Ast first onset start taking:  Start zyrtec (cetirizine) 10mg  OR allegra (fexofenadine) 180mg  twice a day. If symptoms are not controlled or causes drowsiness let us  know. Start Pepcid  (famotidine ) 20mg  twice a day.  Get bloodwork.  Mild intermittent asthma without complication Developed asthma like symptoms post-Covid infection. Only uses  albuterol /Symbicort  as needed. Airsupra  suggested to simplify inhaler regimen.  Normal spirometry today.  May use Airsupra  rescue inhaler 2 puffs every 4 to 6 hours as needed for shortness of breath, chest tightness, coughing, and wheezing. Do not use more than 12 puffs in 24 hours. May use Airsupra  rescue inhaler 2 puffs 5 to 15 minutes prior to strenuous physical activities. Rinse mouth after each use.  Monitor frequency of use - if you need to use it more than twice per week on a consistent basis let us  know.   Heartburn See handout for lifestyle and dietary modifications.  Multiple drug allergies Continue to avoid medications on your allergy list.   Return in about 3 months (around 07/14/2024).  Meds ordered this encounter  Medications   Albuterol -Budesonide  (AIRSUPRA ) 90-80 MCG/ACT AERO    Sig: Inhale 2 puffs into the lungs every 4 (four) hours as needed (coughing, wheezing, chest tightness). Do not exceed 12 puffs in 24 hours.    Dispense:  10.7 g    Refill:  2    BIN: [id number], PCN: [id number], GRP: [id number], ID [id number]   Lab Orders         Allergen Profile, Shellfish         Allergen Profile, Food-Fish         Tryptase         IgE Nut Prof. w/Component Rflx         Coconut IgE         Chocolate IgE         Wheat IgE         Allergen Gluten f79         Alpha-Gal Panel  ANA, IFA (with reflex)         Comprehensive metabolic panel with GFR         Chronic Urticaria (CU) Eval      Other allergy screening: Asthma:  Developed asthma like symptoms after second covid-19 infection. Currently taking albuterol  prn and symbicort  prn. Usually worse with reactions.   Rhino conjunctivitis: yes Medication allergy: yes Hymenoptera allergy: no Eczema:yes History of recurrent infections suggestive of immunodeficency: no  Diagnostics: Spirometry:  Tracings reviewed. Her effort: Good reproducible efforts. FVC: 2.35L FEV1: 1.99L, 93% predicted FEV1/FVC ratio:  85% Interpretation: Spirometry consistent with normal pattern.  Please see scanned spirometry results for details.  Results discussed with patient/family.   Past Medical History: Patient Active Problem List   Diagnosis Date Noted   Asthma 08/07/2021   Menorrhagia 05/02/2021   Post-viral cough syndrome 12/27/2019   Vitamin D  deficiency disease    Perimenopause    Angioedema 02/11/2018   Allergic reaction 12/31/2017   Seasonal allergic rhinitis 12/31/2017   Allergic conjunctivitis 12/31/2017   Idiopathic urticaria 12/31/2017   CIN II (cervical intraepithelial neoplasia II)    Fibroid    Past Medical History:  Diagnosis Date   Allergy    Anaphylaxis    Angioedema 02/11/2018   to cefdinir   CIN II (cervical intraepithelial neoplasia II)    Fibroid    GERD (gastroesophageal reflux disease)    Glaucoma    Intermittent asthma 08/07/2021   Perimenopause    Urticaria    Vitamin D  deficiency disease    Past Surgical History: Past Surgical History:  Procedure Laterality Date   CERVICAL BIOPSY  W/ LOOP ELECTRODE EXCISION  2001   CIN ll   DILATION AND CURETTAGE OF UTERUS     IR ANGIOGRAM PELVIS SELECTIVE OR SUPRASELECTIVE  05/02/2021   IR ANGIOGRAM SELECTIVE EACH ADDITIONAL VESSEL  05/02/2021   IR ANGIOGRAM SELECTIVE EACH ADDITIONAL VESSEL  05/02/2021   IR EMBO TUMOR ORGAN ISCHEMIA INFARCT INC GUIDE ROADMAPPING  05/02/2021   IR RADIOLOGIST EVAL & MGMT  01/18/2021   IR RADIOLOGIST EVAL & MGMT  05/17/2021   IR RADIOLOGIST EVAL & MGMT  11/16/2021   IR RADIOLOGIST EVAL & MGMT  05/03/2022   IR US  GUIDE VASC ACCESS RIGHT  05/02/2021   Thumb Surg     Medication List:  Current Outpatient Medications  Medication Sig Dispense Refill   albuterol  (VENTOLIN  HFA) 108 (90 Base) MCG/ACT inhaler Inhale 2 puffs into the lungs every 4 (four) hours as needed for wheezing or shortness of breath. 18 g 5   Albuterol -Budesonide  (AIRSUPRA ) 90-80 MCG/ACT AERO Inhale 2 puffs into the lungs every 4 (four) hours  as needed (coughing, wheezing, chest tightness). Do not exceed 12 puffs in 24 hours. 10.7 g 2   Ascorbic Acid  (VITAMIN C WITH ROSE HIPS) 500 MG tablet Take 500 mg by mouth daily.     brimonidine (ALPHAGAN) 0.2 % ophthalmic solution Apply to eye.     budesonide -formoterol  (SYMBICORT ) 80-4.5 MCG/ACT inhaler Inhale 2 puffs into the lungs in the morning and at bedtime. 1 each 12   carboxymethylcellulose (REFRESH PLUS) 0.5 % SOLN Place 1 drop into both eyes daily as needed (dry eyes).     Cholecalciferol  (VITAMIN D -3) 125 MCG (5000 UT) TABS Take 10,000 Units by mouth daily.      EPINEPHrine  0.3 mg/0.3 mL IJ SOAJ injection Inject 0.3 mg into the muscle as needed for anaphylaxis. 1 each 1   ibuprofen  (ADVIL ) 200 MG tablet Take 200 mg  by mouth every 6 (six) hours as needed for mild pain.     loratadine  (CLARITIN ) 10 MG tablet Take 10 mg by mouth daily as needed for allergies.     ondansetron  (ZOFRAN ) 8 MG tablet Take 1 tablet (8 mg total) by mouth every 8 (eight) hours as needed for nausea or vomiting. 20 tablet 0   progesterone  (PROMETRIUM ) 200 MG capsule Take 1 capsule (200 mg total) by mouth at bedtime. 90 capsule 1   Tafluprost , PF, (ZIOPTAN ) 0.0015 % SOLN Place 1 drop into both eyes daily.     valACYclovir (VALTREX) 500 MG tablet Take 500 mg by mouth daily as needed (breakouts).     vitamin E  400 UNIT capsule Take 400 Units by mouth daily.     No current facility-administered medications for this visit.   Allergies: Allergies  Allergen Reactions   Cefdinir Anaphylaxis and Shortness Of Breath   Clindamycin/Lincomycin Hives and Shortness Of Breath   Benzonatate  Hives and Itching    Pt developed  itching and  welt-like bumps on her  face, chest, back and arms.  She also developed eye burning . No worsening shortness of breath or lip/ tongue swelling. Symptoms resolved within 36 hours with immediate discontinuation of the medication and  Benadryl  and Zyrtec po    Cosopt [Dorzolamide Hcl-Timolol  Mal]     Lower extremity numbness, tingling   Dilaudid  [Hydromorphone  Hcl] Swelling and Other (See Comments)    Throat closing and tongue swelling   Fish Allergy    Peanut-Containing Drug Products    Shellfish Allergy    Coconut (Cocos Nucifera) Swelling and Rash   Hydrocodone Rash   Social History: Social History   Socioeconomic History   Marital status: Married    Spouse name: Not on file   Number of children: Not on file   Years of education: Not on file   Highest education level: Not on file  Occupational History   Not on file  Tobacco Use   Smoking status: Never   Smokeless tobacco: Never  Vaping Use   Vaping status: Never Used  Substance and Sexual Activity   Alcohol use: No   Drug use: No   Sexual activity: Yes    Birth control/protection: Condom  Other Topics Concern   Not on file  Social History Narrative   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   Social Drivers of Corporate investment banker Strain: Not on file  Food Insecurity: Not on file  Transportation Needs: Not on file  Physical Activity: Not on file  Stress: Not on file  Social Connections: Unknown (11/06/2022)   [facility]   Social Network    Social Network: Not on file   Lives in a house. Smoking: denies Occupation: unemployed  Environmental HistorySurveyor, minerals in the house: no Engineer, civil (consulting) in the family room: yes Carpet in the bedroom: yes Heating: electric Cooling: central Pet: no  Family History: Family History  Problem Relation Age of Onset   Hypertension Mother    Cancer Mother 78       cervical   Pancreatic cancer Father    Cancer Father 56       pancreatic   Diabetes Maternal Aunt    Diabetes Maternal Grandmother    Breast cancer Paternal Grandmother 79 - 79   Asthma Son    Review of Systems  Constitutional:  Negative for appetite change, chills, fever and unexpected weight change.  HENT:   Negative  for congestion and rhinorrhea.   Eyes:  Negative for itching.  Respiratory:  Negative for cough, chest tightness, shortness of breath and wheezing.   Cardiovascular:  Negative for chest pain.  Gastrointestinal:  Negative for abdominal pain.  Genitourinary:  Negative for difficulty urinating.  Skin:  Negative for rash.  Allergic/Immunologic: Positive for environmental allergies and food allergies.  Neurological:  Negative for headaches.    Objective: BP 110/80 (BP Location: Left Arm, Patient Position: Sitting, Cuff Size: Normal)   Pulse (!) 49   Temp 98.4 F (36.9 C) (Temporal)   Resp 15   Ht 5' 1.61 (1.565 m)   Wt 173 lb 9.6 oz (78.7 kg)   SpO2 97%   BMI 32.15 kg/m  Body mass index is 32.15 kg/m. Physical Exam Vitals and nursing note reviewed.  Constitutional:      Appearance: Normal appearance. She is well-developed.  HENT:     Head: Normocephalic and atraumatic.     Right Ear: Tympanic membrane and external ear normal.     Left Ear: Tympanic membrane and external ear normal.     Nose: Nose normal.     Mouth/Throat:     Mouth: Mucous membranes are moist.     Pharynx: Oropharynx is clear.  Eyes:     Conjunctiva/sclera: Conjunctivae normal.  Cardiovascular:     Rate and Rhythm: Normal rate and regular rhythm.     Heart sounds: Normal heart sounds. No murmur heard.    No friction rub. No gallop.  Pulmonary:     Effort: Pulmonary effort is normal.     Breath sounds: Normal breath sounds. No wheezing, rhonchi or rales.  Musculoskeletal:     Cervical back: Neck supple.  Skin:    General: Skin is warm.     Findings: No rash.  Neurological:     Mental Status: She is alert and oriented to person, place, and time.  Psychiatric:        Behavior: Behavior normal.    The plan was reviewed with the patient/family, and all questions/concerned were addressed.  It was my pleasure to see Ashley Archer today and participate in her care. Please feel free to contact me with  any questions or concerns.  Sincerely,  Orlan Cramp, DO Allergy & Immunology  [facility]  [facility] office: [phone] [facility] office: [phone]

## 2024-04-13 ENCOUNTER — Other Ambulatory Visit: Payer: Self-pay

## 2024-04-13 ENCOUNTER — Ambulatory Visit: Payer: Self-pay | Admitting: Allergy

## 2024-04-13 ENCOUNTER — Encounter: Payer: Self-pay | Admitting: Allergy

## 2024-04-13 VITALS — BP 110/80 | HR 49 | Temp 98.4°F | Resp 15 | Ht 61.61 in | Wt 173.6 lb

## 2024-04-13 DIAGNOSIS — J4520 Mild intermittent asthma, uncomplicated: Secondary | ICD-10-CM

## 2024-04-13 DIAGNOSIS — Z889 Allergy status to unspecified drugs, medicaments and biological substances status: Secondary | ICD-10-CM

## 2024-04-13 DIAGNOSIS — J3089 Other allergic rhinitis: Secondary | ICD-10-CM | POA: Diagnosis not present

## 2024-04-13 DIAGNOSIS — R12 Heartburn: Secondary | ICD-10-CM

## 2024-04-13 DIAGNOSIS — L509 Urticaria, unspecified: Secondary | ICD-10-CM

## 2024-04-13 DIAGNOSIS — T7800XD Anaphylactic reaction due to unspecified food, subsequent encounter: Secondary | ICD-10-CM

## 2024-04-13 MED ORDER — AIRSUPRA 90-80 MCG/ACT IN AERO: 2.0000 | INHALATION_SPRAY | RESPIRATORY_TRACT | 2 refills | Status: AC | PRN

## 2024-04-13 NOTE — Patient Instructions (Addendum)
 Food Start strict avoidance of seafood, peanuts, tree nuts, coconut.  For mild symptoms you can take over the counter antihistamines (zyrtec 10mg  to 20mg ) and monitor symptoms closely.  If symptoms worsen or if you have severe symptoms including breathing issues, throat closure, significant swelling, whole body hives, severe diarrhea and vomiting, lightheadedness then use epinephrine  and seek immediate medical care afterwards. Emergency action plan given. Get bloodwork We are ordering labs, so please allow 1-2 weeks for the results to come back. With the newly implemented Cures Act, the labs might be visible to you at the same time that they become visible to me. However, I will not address the results until all of the results are back, so please be patient.  In the meantime, continue recommendations in your patient instructions, including avoidance measures (if applicable), until you hear from me.  Hives Avoid the following potential triggers: alcohol, tight clothing, NSAIDs, hot showers and getting overheated. See below for proper skin care.   Ast first onset start taking:  Start zyrtec (cetirizine) 10mg  OR allegra (fexofenadine) 180mg  twice a day. If symptoms are not controlled or causes drowsiness let us  know. Start Pepcid  (famotidine ) 20mg  twice a day.   Get bloodwork. We are ordering labs, so please allow 1-2 weeks for the results to come back. With the newly implemented Cures Act, the labs might be visible to you at the same time that they become visible to me. However, I will not address the results until all of the results are back, so please be patient.   Environmental allergies 2019 skin testing positive to grass, weed, trees, ragweed. Start environmental control measures as below. Use over the counter antihistamines such as Zyrtec (cetirizine), Claritin  (loratadine ), Allegra (fexofenadine), or Xyzal  (levocetirizine) daily as needed. May take twice a day during allergy flares.  May switch antihistamines every few months. Consider allergy injections for long term control if above medications do not help the symptoms.  Nasal saline spray (i.e., Simply Saline) or nasal saline lavage (i.e., NeilMed) is recommended as needed and prior to medicated nasal sprays.  Breathing Normal breathing test today. May use Airsupra  rescue inhaler 2 puffs every 4 to 6 hours as needed for shortness of breath, chest tightness, coughing, and wheezing. Do not use more than 12 puffs in 24 hours. May use Airsupra  rescue inhaler 2 puffs 5 to 15 minutes prior to strenuous physical activities. Rinse mouth after each use.  Coupon given.  Monitor frequency of use - if you need to use it more than twice per week on a consistent basis let us  know.   Heartburn See handout for lifestyle and dietary modifications.  Drug allergies Continue to avoid medications on your allergy list.   Return in about 3 months (around 07/14/2024). Or sooner if needed.    Reducing Pollen Exposure Pollen seasons: trees (spring), grass (summer) and ragweed/weeds (fall). Keep windows closed in your home and car to lower pollen exposure.  Install air conditioning in the bedroom and throughout the house if possible.  Avoid going out in dry windy days - especially early morning. Pollen counts are highest between 5 - 10 AM and on dry, hot and windy days.  Save outside activities for late afternoon or after a heavy rain, when pollen levels are lower.  Avoid mowing of grass if you have grass pollen allergy. Be aware that pollen can also be transported indoors on people and pets.  Dry your clothes in an automatic dryer rather than hanging them outside where they  might collect pollen.  Rinse hair and eyes before bedtime.  Skin care recommendations  Bath time: Always use lukewarm water. AVOID very hot or cold water. Keep bathing time to 5-10 minutes. Do NOT use bubble bath. Use a mild soap and use just enough to wash the  dirty areas. Do NOT scrub skin vigorously.  After bathing, pat dry your skin with a towel. Do NOT rub or scrub the skin.  Moisturizers and prescriptions:  ALWAYS apply moisturizers immediately after bathing (within 3 minutes). This helps to lock-in moisture. Use the moisturizer several times a day over the whole body. Good summer moisturizers include: Aveeno, CeraVe, Cetaphil. Good winter moisturizers include: Aquaphor, Vaseline, Cerave, Cetaphil, Eucerin, Vanicream. When using moisturizers along with medications, the moisturizer should be applied about one hour after applying the medication to prevent diluting effect of the medication or moisturize around where you applied the medications. When not using medications, the moisturizer can be continued twice daily as maintenance.  Laundry and clothing: Avoid laundry products with added color or perfumes. Use unscented hypo-allergenic laundry products such as Tide free, Cheer free & gentle, and All free and clear.  If the skin still seems dry or sensitive, you can try double-rinsing the clothes. Avoid tight or scratchy clothing such as wool. Do not use fabric softeners or dyer sheets.

## 2024-04-16 LAB — IGE NUT PROF. W/COMPONENT RFLX

## 2024-04-21 ENCOUNTER — Telehealth: Payer: Self-pay | Admitting: Allergy

## 2024-04-21 NOTE — Telephone Encounter (Signed)
 PT called to review lab results over the phone, as she was a little confused and was hoping to get some guidance from provider. I reviewed chart and advised PT that Dr Luke still had one final lab to review (Chronic Urticaria) before making eval, but would still let her know that PT called to discuss, she thanked and confirmed [phone] best call back #

## 2024-04-22 ENCOUNTER — Ambulatory Visit: Payer: Self-pay | Admitting: Allergy

## 2024-04-22 NOTE — Telephone Encounter (Signed)
 Mychart message sent. Cu pending.

## 2024-04-24 LAB — CHRONIC URTICARIA (CU) EVAL
ALT: 19 IU/L (ref 0–32)
Basophils Absolute: 0 x10E3/uL (ref 0.0–0.2)
Basos: 0 %
CRP: <1 mg/L (ref 0–10)
EOS (ABSOLUTE): 0.1 x10E3/uL (ref 0.0–0.4)
Eos: 2 %
Hematocrit: 44.4 % (ref 34.0–46.6)
Hemoglobin: 14.9 g/dL (ref 11.1–15.9)
Immature Grans (Abs): 0 x10E3/uL (ref 0.0–0.1)
Immature Granulocytes: 0 %
Lymphocytes Absolute: 2.7 x10E3/uL (ref 0.7–3.1)
Lymphs: 55 %
MCH: 31.2 pg (ref 26.6–33.0)
MCHC: 33.6 g/dL (ref 31.5–35.7)
MCV: 93 fL (ref 79–97)
Monocytes Absolute: 0.2 x10E3/uL (ref 0.1–0.9)
Monocytes: 5 %
Neutrophils Absolute: 1.9 x10E3/uL (ref 1.4–7.0)
Neutrophils: 38 %
Platelets: 172 x10E3/uL (ref 150–450)
Pooled Donor- BAT CU: 3.72 (ref 0.00–10.60)
RBC: 4.77 x10E6/uL (ref 3.77–5.28)
RDW: 11.6 % — ABNORMAL LOW (ref 11.7–15.4)
Sed Rate: 15 mm/h (ref 0–40)
TSH: 0.469 u[IU]/mL (ref 0.450–4.500)
Thyroperoxidase Ab SerPl-aCnc: 12 [IU]/mL (ref 0–34)
WBC: 4.9 x10E3/uL (ref 3.4–10.8)

## 2024-04-24 LAB — PEANUT COMPONENTS
F352-IgE Ara h 8: <0.1 kU/L
F422-IgE Ara h 1: <0.1 kU/L
F423-IgE Ara h 2: <0.1 kU/L
F424-IgE Ara h 3: <0.1 kU/L
F427-IgE Ara h 9: <0.1 kU/L
F447-IgE Ara h 6: <0.1 kU/L

## 2024-04-24 LAB — ALLERGEN COMPONENT COMMENTS

## 2024-04-24 LAB — IGE NUT PROF. W/COMPONENT RFLX
F017-IgE Hazelnut (Filbert): <0.1 kU/L
F018-IgE Brazil Nut: <0.1 kU/L
F020-IgE Almond: <0.1 kU/L
F202-IgE Cashew Nut: <0.1 kU/L
F203-IgE Pistachio Nut: <0.1 kU/L
F256-IgE Walnut: <0.1 kU/L
Macadamia Nut, IgE: <0.1 kU/L
Peanut, IgE: 0.11 kU/L — AB
Pecan Nut IgE: <0.1 kU/L

## 2024-04-24 LAB — ALPHA-GAL PANEL
Allergen Lamb IgE: <0.1 kU/L
Beef IgE: <0.1 kU/L
IgE (Immunoglobulin E), Serum: 51 [IU]/mL (ref 6–495)
O215-IgE Alpha-Gal: <0.1 kU/L
Pork IgE: <0.1 kU/L

## 2024-04-24 LAB — TRYPTASE: Tryptase: 6.2 ug/L (ref 2.2–13.2)

## 2024-04-24 LAB — COMPREHENSIVE METABOLIC PANEL WITH GFR
AST: 17 IU/L (ref 0–40)
Albumin: 4.6 g/dL (ref 3.8–4.9)
Alkaline Phosphatase: 65 IU/L (ref 44–121)
BUN/Creatinine Ratio: 20 (ref 9–23)
BUN: 14 mg/dL (ref 6–24)
Bilirubin Total: 0.6 mg/dL (ref 0.0–1.2)
CO2: 22 mmol/L (ref 20–29)
Calcium: 10.3 mg/dL — ABNORMAL HIGH (ref 8.7–10.2)
Chloride: 102 mmol/L (ref 96–106)
Creatinine, Ser: 0.7 mg/dL (ref 0.57–1.00)
Globulin, Total: 2.5 g/dL (ref 1.5–4.5)
Glucose: 96 mg/dL (ref 70–99)
Potassium: 4.6 mmol/L (ref 3.5–5.2)
Sodium: 139 mmol/L (ref 134–144)
Total Protein: 7.1 g/dL (ref 6.0–8.5)
eGFR: 105 mL/min/1.73 (ref >59)

## 2024-04-24 LAB — ALLERGEN PROFILE, FOOD-FISH
Allergen Mackerel IgE: <0.1 kU/L
Allergen Salmon IgE: <0.1 kU/L
Allergen Trout IgE: <0.1 kU/L
Allergen Walley Pike IgE: <0.1 kU/L
Codfish IgE: <0.1 kU/L
Halibut IgE: <0.1 kU/L
Tuna: <0.1 kU/L

## 2024-04-24 LAB — ALLERGEN COCONUT IGE: Allergen Coconut IgE: <0.1 kU/L

## 2024-04-24 LAB — ALLERGEN PROFILE, SHELLFISH
Clam IgE: <0.1 kU/L
F023-IgE Crab: <0.1 kU/L
F080-IgE Lobster: <0.1 kU/L
F290-IgE Oyster: <0.1 kU/L
Scallop IgE: <0.1 kU/L
Shrimp IgE: <0.1 kU/L

## 2024-04-24 LAB — FANA STAINING PATTERNS: Speckled Pattern: 1:80 {titer}

## 2024-04-24 LAB — ALLERGEN CHOCOLATE: Chocolate/Cacao IgE: <0.1 kU/L

## 2024-04-24 LAB — ALLERGEN GLUTEN F79: Allergen Gluten IgE: <0.1 kU/L

## 2024-04-24 LAB — ANTINUCLEAR ANTIBODIES, IFA: ANA Titer 1: POSITIVE — AB

## 2024-04-24 LAB — ALLERGEN, WHEAT, F4: Wheat IgE: <0.1 kU/L

## 2024-05-01 ENCOUNTER — Ambulatory Visit
Admission: RE | Admit: 2024-05-01 | Discharge: 2024-05-01 | Disposition: A | Discharge status: Home / Self Care | Source: Ambulatory Visit | Attending: Obstetrics and Gynecology | Admitting: Obstetrics and Gynecology

## 2024-05-01 DIAGNOSIS — Z800 Family history of malignant neoplasm of digestive organs: Secondary | ICD-10-CM

## 2024-05-01 MED ORDER — GADOPICLENOL 0.5 MMOL/ML IV SOLN
7.5000 mL | Freq: Once | INTRAVENOUS | Status: AC | PRN
  Administered 2024-05-01: 7.5 mL via INTRAVENOUS

## 2024-05-11 ENCOUNTER — Ambulatory Visit: Admitting: Allergy

## 2024-05-11 ENCOUNTER — Encounter: Payer: Self-pay | Admitting: Allergy

## 2024-05-11 DIAGNOSIS — J3089 Other allergic rhinitis: Secondary | ICD-10-CM

## 2024-05-11 DIAGNOSIS — T7800XD Anaphylactic reaction due to unspecified food, subsequent encounter: Secondary | ICD-10-CM

## 2024-05-11 DIAGNOSIS — Z889 Allergy status to unspecified drugs, medicaments and biological substances status: Secondary | ICD-10-CM

## 2024-05-11 DIAGNOSIS — L509 Urticaria, unspecified: Secondary | ICD-10-CM

## 2024-05-11 DIAGNOSIS — J4520 Mild intermittent asthma, uncomplicated: Secondary | ICD-10-CM

## 2024-05-11 DIAGNOSIS — R12 Heartburn: Secondary | ICD-10-CM

## 2024-05-11 NOTE — Progress Notes (Signed)
 Skin testing note  RE: Ashley Archer MRN: [id number] DOB: 11-Oct-1970 Date of Office Visit: 05/11/2024  Referring provider: No ref. provider found Primary care provider: Patient, No Pcp Per  Chief Complaint: skin testing  History of Present Illness: I had the pleasure of seeing Ashley Archer for a skin testing visit at the [facility] on 05/11/2024. She is a 53 y.o. female, who is being followed for food allergies, allergic rhinitis, urticaria, asthma, heartburn, multiple drug allergies. Her previous allergy  office visit was on 04/13/2024 with Dr. Luke. Today is a skin testing visit.   Discussed the use of AI scribe software for clinical note transcription with the patient, who gave verbal consent to proceed.    She is seeking allergy  testing for seafood, peanuts, tree nuts, and coconut. She has not taken her allergy  medication for three days in preparation for the testing. Previous blood work for these allergens was negative, but she has a history of severe allergic reactions, including an episode requiring an EpiPen  and emergency room visit due to seafood.  Her environmental allergies were last tested six years ago, showing allergies to pollen, grass, trees, weeds, and ragweed.  She recalls a severe reaction to seafood that necessitated an ER visit. Reactions to peanuts, tree nuts, and coconut have occurred but did not require an EpiPen . She wants to potentially reintroduce peanut  butter and almonds into her diet, as these were favorite snacks before she stopped consuming them due to allergic reactions.  She experienced a recent allergic reaction to a ginger snap cookie, but the specific allergen is unknown as she did not have the packaging to check the ingredients.  She typically takes an allergy  pill daily but has abstained since Thursday in preparation for the allergy  testing.     2025 labs: Blood count, kidney function, liver function, electrolytes, thyroid ,  inflammation markers,  tryptase (checks for mast cell issues) and alpha gal (checks for red meat allergy ) were all normal which is great.    Shellfish panel negative. Fish panel negative. Coconut, chocolate, wheat, gluten, negative.  Nut panel - negative to tree nuts and essentially negative to peanuts.  Recommend that you schedule for skin testing for the above foods.  Continue to avoid seafood, peanuts, tree nuts and coconut as before until then.    Your autoimmune screener was positive but the titers were only borderline positive at 1:80.  I don't think this is cause for the hives. Sometimes if people complain of other symptoms such as excessive joint pains, muscle pains or have weird rashes then I refer to rheumatology next for further evaluation.   Assessment and Plan: Ashley Archer is a 53 y.o. female with: Anaphylactic reaction due to food, subsequent encounter Past history - Recent severe allergic reactions to seafood and nuts, including anaphylaxis. Possible cross-contamination and cross-reactivity considered. History of EpiPen  use and ER visit noted. Interim history - 2025 labs negative to shellfish panel, fish panel, coconut, chocolate, wheat, gluten. Nut panel - negative to tree nuts and essentially negative to peanuts.  Today's skin testing negative to peanuts, tree nuts, coconut and seafood.  Continue to avoid seafood, peanuts, tree nuts and coconut. Don't eat these foods until food challenge is done.  For mild symptoms you can take over the counter antihistamines (zyrtec 10mg  to 20mg ) and monitor symptoms closely.  If symptoms worsen or if you have severe symptoms including breathing issues, throat closure, significant swelling, whole body hives, severe diarrhea and vomiting, lightheadedness then use  epinephrine  and seek immediate medical care afterwards. Emergency action plan in place. If interested we can schedule food challenge to tree nut butter first, then peanut  butter then  coconut milk.    Other allergic rhinitis Past history - 2019 skin testing positive to grass, weed, trees, ragweed. Today's skin testing positive to grass, ragweed, and trees. Borderline to weed mix.  Start environmental control measures as below. Use over the counter antihistamines such as Zyrtec (cetirizine), Claritin  (loratadine ), Allegra (fexofenadine), or Xyzal  (levocetirizine) daily as needed. May take twice a day during allergy  flares. May switch antihistamines every few months. Consider allergy  injections for long term control if above medications do not help the symptoms - handout given.    Urticaria Past history - Intermittent hives triggered by fragrances and outdoor exposure, managed with daily antihistamines. Interim history - 2025 labs unremarkable except for ANA titer of 1:80.  Avoid the following potential triggers: alcohol, tight clothing, NSAIDs, hot showers and getting overheated. Continue proper skin care.  At first onset start taking:  Start zyrtec (cetirizine) 10mg  OR allegra (fexofenadine) 180mg  twice a day. If symptoms are not controlled or causes drowsiness let us  know. Start Pepcid  (famotidine ) 20mg  twice a day.   Mild intermittent asthma without complication Past history - developed asthma like symptoms post-Covid infection. Only uses albuterol /Symbicort  as needed. Airsupra  suggested to simplify inhaler regimen. 2025 spirometry normal.  May use Airsupra  rescue inhaler 2 puffs every 4 to 6 hours as needed for shortness of breath, chest tightness, coughing, and wheezing. Do not use more than 12 puffs in 24 hours. May use Airsupra  rescue inhaler 2 puffs 5 to 15 minutes prior to strenuous physical activities. Rinse mouth after each use.  Monitor frequency of use - if you need to use it more than twice per week on a consistent basis let us  know.    Heartburn Continue lifestyle and dietary modifications.   Multiple drug allergies Continue to avoid medications on your  allergy  list.    Return for Food challenge.  No orders of the defined types were placed in this encounter.  Lab Orders  No laboratory test(s) ordered today    Diagnostics: Skin Testing: Select foods. Today's skin testing positive to grass, ragweed, and trees. Borderline to weed mix.  Negative to peanuts, tree nuts, coconut and seafood.  Results discussed with patient/family.  Airborne Adult Perc - 05/11/24 1436     Time Antigen Placed 1436    Allergen Manufacturer Jestine    Location Back    Number of Test 55    1. Control-Buffer 50% Glycerol Negative    2. Control-Histamine 2+    3. Bahia Negative    4. French Southern Territories Negative    5. Johnson Negative    6. [place]  Blue Negative    7. Meadow Fescue Negative    8. Perennial Rye Negative    9. Timothy Negative    10. Ragweed Mix Negative    11. Cocklebur Negative    12. Plantain,  English Negative    13. Baccharis Negative    14. Dog Fennel Negative    15. Russian Thistle Negative    16. Lamb's Quarters Negative    17. Sheep Sorrell Negative    18. Rough Pigweed Negative    19. Marsh Elder, Rough Negative    20. Mugwort, Common Negative    21. Box, Elder Negative    22. Cedar, red Negative    23. Sweet Gum Negative    24. Pecan Pollen Negative  25. Pine Mix Negative    26. Walnut, Black Pollen 2+    27. Red Mulberry Negative    28. Ash Mix Negative    29. Birch Mix Negative    30. Beech American Negative    31. Cottonwood, Guinea[place]issau Negative    32. Hickory, White 2+    33. Maple Mix Negative    34. Oak, [place] Mix Negative    35. Sycamore Eastern Negative    36. Alternaria Alternata Negative    37. Cladosporium Herbarum Negative    38. Aspergillus Mix Negative    39. Penicillium Mix Negative    40. Bipolaris Sorokiniana (Helminthosporium) Negative    41. Drechslera Spicifera (Curvularia) Negative    42. Mucor Plumbeus Negative    43. Fusarium Moniliforme Negative    44. Aureobasidium Pullulans (pullulara)  Negative    45. Rhizopus Oryzae Negative    46. Botrytis Cinera Negative    47. Epicoccum Nigrum Negative    48. Phoma Betae Negative    49. Dust Mite Mix Negative    50. Cat Hair 10,000 BAU/ml Negative    51.  Dog Epithelia Negative    52. Mixed Feathers Negative    53. Horse Epithelia Negative    54. Cockroach, German Negative    55. Tobacco Leaf Negative          Intradermal - 05/11/24 1517     Time Antigen Placed 1517    Allergen Manufacturer Jestine    Location Arm    Number of Test 15    Control Negative    Bahia 2+    [place] 2+    Johnson 2+    7 Grass 2+    Ragweed Mix 2+    Weed Mix --   +/-   Mold 1 Negative    Mold 2 Negative    Mold 3 Negative    Mold 4 Negative    Mite Mix Negative    Cat Negative    Dog Negative    Cockroach Negative          Food Adult Perc - 05/11/24 1400     Time Antigen Placed 1437    Allergen Manufacturer Jestine    Location Back    Number of allergen test 21    1. Peanut  Negative    8. Shellfish Mix Negative    9. Fish Mix Negative    10. Cashew Negative    11. Walnut Food Negative    12. Almond Negative    13. Hazelnut Negative    14. Pecan Food Negative    15. Pistachio Negative    16. [place] Nut Negative    17. Coconut Negative    18. Trout Negative    19. Tuna Negative    20. Salmon Negative    21. Flounder Negative    22. Codfish Negative    23. Shrimp Negative    24. Crab Negative    25. Lobster Negative    26. Oyster Negative    27. Scallops Negative          Previous notes and tests were reviewed. The plan was reviewed with the patient/family, and all questions/concerned were addressed.  It was my pleasure to see Ashley Archer today and participate in her care. Please feel free to contact me with any questions or concerns.  Sincerely,  Orlan Cramp, DO Allergy  & Immunology  [facility]  [place] office: [phone] [facility] office: [phone]

## 2024-05-11 NOTE — Patient Instructions (Addendum)
 Today's skin testing positive to grass, ragweed, and trees. Borderline to weed mix.  Negative to peanuts, tree nuts, coconut and seafood.   Results given.  Food allergies Continue to avoid seafood, peanuts, tree nuts and coconut. Don't eat these foods until food challenge is done.  For mild symptoms you can take over the counter antihistamines (zyrtec 10mg  to 20mg ) and monitor symptoms closely.  If symptoms worsen or if you have severe symptoms including breathing issues, throat closure, significant swelling, whole body hives, severe diarrhea and vomiting, lightheadedness then use epinephrine  and seek immediate medical care afterwards. Emergency action plan in place.  If interested we can schedule food challenge to tree nut butter first, then peanut  butter then coconut milk.   Food challenge instructions: You must be off antihistamines for 3-5 days before. Must be in good health and not ill. No vaccines/injections/antibiotics within the past 7 days.  Plan on being in the office for 2-3 hours and must bring in the food you want to do the oral challenge for.  You must call to schedule an appointment and specify it's for a food challenge.   Environmental allergies Start environmental control measures as below. Use over the counter antihistamines such as Zyrtec (cetirizine), Claritin  (loratadine ), Allegra (fexofenadine), or Xyzal  (levocetirizine) daily as needed. May take twice a day during allergy  flares. May switch antihistamines every few months. Consider allergy  injections for long term control if above medications do not help the symptoms - handout given.   Hives Avoid the following potential triggers: alcohol, tight clothing, NSAIDs, hot showers and getting overheated. Continue proper skin care.  At first onset start taking:  Start zyrtec (cetirizine) 10mg  OR allegra (fexofenadine) 180mg  twice a day. If symptoms are not controlled or causes drowsiness let us  know. Start Pepcid   (famotidine ) 20mg  twice a day.   Breathing May use Airsupra  rescue inhaler 2 puffs every 4 to 6 hours as needed for shortness of breath, chest tightness, coughing, and wheezing. Do not use more than 12 puffs in 24 hours. May use Airsupra  rescue inhaler 2 puffs 5 to 15 minutes prior to strenuous physical activities. Rinse mouth after each use.  Monitor frequency of use - if you need to use it more than twice per week on a consistent basis let us  know.   Heartburn Continue lifestyle and dietary modifications.  Drug allergies Continue to avoid medications on your allergy  list.   Return for Food challenge. Or sooner if needed.    Reducing Pollen Exposure Pollen seasons: trees (spring), grass (summer) and ragweed/weeds (fall). Keep windows closed in your home and car to lower pollen exposure.  Install air conditioning in the bedroom and throughout the house if possible.  Avoid going out in dry windy days - especially early morning. Pollen counts are highest between 5 - 10 AM and on dry, hot and windy days.  Save outside activities for late afternoon or after a heavy rain, when pollen levels are lower.  Avoid mowing of grass if you have grass pollen allergy . Be aware that pollen can also be transported indoors on people and pets.  Dry your clothes in an automatic dryer rather than hanging them outside where they might collect pollen.  Rinse hair and eyes before bedtime.  Skin care recommendations  Bath time: Always use lukewarm water. AVOID very hot or cold water. Keep bathing time to 5-10 minutes. Do NOT use bubble bath. Use a mild soap and use just enough to wash the dirty areas. Do NOT scrub skin  vigorously.  After bathing, pat dry your skin with a towel. Do NOT rub or scrub the skin.  Moisturizers and prescriptions:  ALWAYS apply moisturizers immediately after bathing (within 3 minutes). This helps to lock-in moisture. Use the moisturizer several times a day over the whole  body. Good summer moisturizers include: Aveeno, CeraVe, Cetaphil. Good winter moisturizers include: Aquaphor, Vaseline, Cerave, Cetaphil, Eucerin, Vanicream. When using moisturizers along with medications, the moisturizer should be applied about one hour after applying the medication to prevent diluting effect of the medication or moisturize around where you applied the medications. When not using medications, the moisturizer can be continued twice daily as maintenance.  Laundry and clothing: Avoid laundry products with added color or perfumes. Use unscented hypo-allergenic laundry products such as Tide free, Cheer free & gentle, and All free and clear.  If the skin still seems dry or sensitive, you can try double-rinsing the clothes. Avoid tight or scratchy clothing such as wool. Do not use fabric softeners or dyer sheets.   Return for Food challenge. Or sooner if needed.

## 2024-06-02 ENCOUNTER — Telehealth: Payer: Self-pay | Admitting: Allergy

## 2024-06-02 NOTE — Telephone Encounter (Signed)
 Ashley Archer has been scheduled for a peanut  challenge on 9/24 at 1:30 pm with Dr. Luke.  Ashley Archer has been made aware of us  having peanut  butter.  Ashley Archer will need a drug/food challenge protocol paper mailed to her. Ashley Archer has been made aware to stop all anti-histamines 3 days prior.

## 2024-06-03 ENCOUNTER — Encounter: Payer: Self-pay | Admitting: Allergy

## 2024-06-03 NOTE — Telephone Encounter (Signed)
 Challenge protocol has been placed in the mail.

## 2024-06-24 ENCOUNTER — Ambulatory Visit: Admitting: Allergy

## 2024-06-24 DIAGNOSIS — T7800XD Anaphylactic reaction due to unspecified food, subsequent encounter: Secondary | ICD-10-CM

## 2024-06-24 NOTE — Progress Notes (Signed)
 Patient was scheduled for a peanut  butter oral food challenge today but feeling nauseous and queasy.  Patient will reschedule food challenge appointment.

## 2024-06-24 NOTE — Patient Instructions (Addendum)
 Food allergies Continue to avoid seafood, peanuts, tree nuts and coconut. Don't eat these foods until food challenge is done.  For mild symptoms you can take over the counter antihistamines (zyrtec 10mg  to 20mg ) and monitor symptoms closely.  If symptoms worsen or if you have severe symptoms including breathing issues, throat closure, significant swelling, whole body hives, severe diarrhea and vomiting, lightheadedness then use epinephrine  and seek immediate medical care afterwards. Emergency action plan in place.  If interested we can schedule food challenge to tree nut butter first, then peanut  butter then coconut milk.   Food challenge instructions: You must be off antihistamines for 3-5 days before. Must be in good health and not ill. No vaccines/injections/antibiotics within the past 7 days.  Plan on being in the office for 2-3 hours and must bring in the food you want to do the oral challenge for.  You must call to schedule an appointment and specify it's for a food challenge.   Environmental allergies Start environmental control measures as below. Use over the counter antihistamines such as Zyrtec (cetirizine), Claritin  (loratadine ), Allegra (fexofenadine), or Xyzal  (levocetirizine) daily as needed. May take twice a day during allergy  flares. May switch antihistamines every few months. Consider allergy  injections for long term control if above medications do not help the symptoms - handout given.   Hives Avoid the following potential triggers: alcohol, tight clothing, NSAIDs, hot showers and getting overheated. Continue proper skin care.  At first onset start taking:  Start zyrtec (cetirizine) 10mg  OR allegra (fexofenadine) 180mg  twice a day. If symptoms are not controlled or causes drowsiness let us  know. Start Pepcid  (famotidine ) 20mg  twice a day.   Breathing May use Airsupra  rescue inhaler 2 puffs every 4 to 6 hours as needed for shortness of breath, chest tightness, coughing,  and wheezing. Do not use more than 12 puffs in 24 hours. May use Airsupra  rescue inhaler 2 puffs 5 to 15 minutes prior to strenuous physical activities. Rinse mouth after each use.  Monitor frequency of use - if you need to use it more than twice per week on a consistent basis let us  know.   Heartburn Continue lifestyle and dietary modifications.  Drug allergies Continue to avoid medications on your allergy  list.   Return for Food challenge. Or sooner if needed.    Reducing Pollen Exposure Pollen seasons: trees (spring), grass (summer) and ragweed/weeds (fall). Keep windows closed in your home and car to lower pollen exposure.  Install air conditioning in the bedroom and throughout the house if possible.  Avoid going out in dry windy days - especially early morning. Pollen counts are highest between 5 - 10 AM and on dry, hot and windy days.  Save outside activities for late afternoon or after a heavy rain, when pollen levels are lower.  Avoid mowing of grass if you have grass pollen allergy . Be aware that pollen can also be transported indoors on people and pets.  Dry your clothes in an automatic dryer rather than hanging them outside where they might collect pollen.  Rinse hair and eyes before bedtime.  Skin care recommendations  Bath time: Always use lukewarm water. AVOID very hot or cold water. Keep bathing time to 5-10 minutes. Do NOT use bubble bath. Use a mild soap and use just enough to wash the dirty areas. Do NOT scrub skin vigorously.  After bathing, pat dry your skin with a towel. Do NOT rub or scrub the skin.  Moisturizers and prescriptions:  ALWAYS apply moisturizers immediately  after bathing (within 3 minutes). This helps to lock-in moisture. Use the moisturizer several times a day over the whole body. Good summer moisturizers include: Aveeno, CeraVe, Cetaphil. Good winter moisturizers include: Aquaphor, Vaseline, Cerave, Cetaphil, Eucerin, Vanicream. When using  moisturizers along with medications, the moisturizer should be applied about one hour after applying the medication to prevent diluting effect of the medication or moisturize around where you applied the medications. When not using medications, the moisturizer can be continued twice daily as maintenance.  Laundry and clothing: Avoid laundry products with added color or perfumes. Use unscented hypo-allergenic laundry products such as Tide free, Cheer free & gentle, and All free and clear.  If the skin still seems dry or sensitive, you can try double-rinsing the clothes. Avoid tight or scratchy clothing such as wool. Do not use fabric softeners or dyer sheets.

## 2024-07-05 NOTE — Progress Notes (Unsigned)
 Follow Up Note  RE: Ashley Archer MRN: [id number] DOB: 03/02/1971 Date of Office Visit: 07/06/2024  Referring provider: No ref. provider found Primary care provider: Patient, No Pcp Per  Chief Complaint:No chief complaint on file.   Assessment and Plan: Ashley Archer is a 52 y.o. female with: No problem-specific Assessment & Plan notes found for this encounter.  No follow-ups on file.  Challenge food: *** Challenge as per protocol: {Blank single:19197::Passed,Failed} Total time: ***  Do not eat challenge food for next 24 hours and monitor for hives, swelling, shortness of breath and dizziness. If you see these symptoms, use Benadryl  for mild symptoms and epinephrine  for more severe symptoms and call 911.  If no adverse symptoms in the next 24 hours, repeat the challenge food the next day and observe for 1 hour. If no adverse symptoms, can eat the food on regular basis.   History of Present Illness: I had the pleasure of seeing Ashley Archer for a follow up visit at the [facility] on 07/05/2024. She is a 53 y.o. female, who is being followed for food allergies, allergic rhinitis, urticaria, asthma, heartburn and multiple drug allergies. Her previous allergy  office visit was on 05/11/2024 with Dr. Luke. Today she is here for peanut  butter food challenge.   Discussed the use of AI scribe software for clinical note transcription with the patient, who gave verbal consent to proceed.  History of Present Illness             History of Reaction: Anaphylactic reaction due to food, subsequent encounter Past history - Recent severe allergic reactions to seafood and nuts, including anaphylaxis. Possible cross-contamination and cross-reactivity considered. History of EpiPen  use and ER visit noted. Interim history - 2025 labs negative to shellfish panel, fish panel, coconut, chocolate, wheat, gluten. Nut panel - negative to tree nuts and essentially negative to peanuts.   Today's skin testing negative to peanuts, tree nuts, coconut and seafood.  Continue to avoid seafood, peanuts, tree nuts and coconut. Don't eat these foods until food challenge is done.  For mild symptoms you can take over the counter antihistamines (zyrtec 10mg  to 20mg ) and monitor symptoms closely.  If symptoms worsen or if you have severe symptoms including breathing issues, throat closure, significant swelling, whole body hives, severe diarrhea and vomiting, lightheadedness then use epinephrine  and seek immediate medical care afterwards. Emergency action plan in place. If interested we can schedule food challenge to tree nut butter first, then peanut  butter then coconut milk.     Other allergic rhinitis Past history - 2019 skin testing positive to grass, weed, trees, ragweed. Today's skin testing positive to grass, ragweed, and trees. Borderline to weed mix.  Start environmental control measures as below. Use over the counter antihistamines such as Zyrtec (cetirizine), Claritin  (loratadine ), Allegra (fexofenadine), or Xyzal  (levocetirizine) daily as needed. May take twice a day during allergy  flares. May switch antihistamines every few months. Consider allergy  injections for long term control if above medications do not help the symptoms - handout given.    Urticaria Past history - Intermittent hives triggered by fragrances and outdoor exposure, managed with daily antihistamines. Interim history - 2025 labs unremarkable except for ANA titer of 1:80.  Avoid the following potential triggers: alcohol, tight clothing, NSAIDs, hot showers and getting overheated. Continue proper skin care.  At first onset start taking:  Start zyrtec (cetirizine) 10mg  OR allegra (fexofenadine) 180mg  twice a day. If symptoms are not controlled or causes drowsiness let us   know. Start Pepcid  (famotidine ) 20mg  twice a day.    Mild intermittent asthma without complication Past history - developed asthma like  symptoms post-Covid infection. Only uses albuterol /Symbicort  as needed. Airsupra  suggested to simplify inhaler regimen. 2025 spirometry normal.  May use Airsupra  rescue inhaler 2 puffs every 4 to 6 hours as needed for shortness of breath, chest tightness, coughing, and wheezing. Do not use more than 12 puffs in 24 hours. May use Airsupra  rescue inhaler 2 puffs 5 to 15 minutes prior to strenuous physical activities. Rinse mouth after each use.  Monitor frequency of use - if you need to use it more than twice per week on a consistent basis let us  know.    Heartburn Continue lifestyle and dietary modifications.   Multiple drug allergies Continue to avoid medications on your allergy  list.     Labs/skin testing: *** Interval History: Patient has not been ill, she has not had any accidental exposures to the culprit food.   Recent/Current History: Pulmonary disease: {Blank single:19197::yes,no} Cardiac disease: {Blank single:19197::yes,no} Respiratory infection: {Blank single:19197::yes,no} Rash: {Blank single:19197::yes,no} Itch: {Blank single:19197::yes,no} Swelling: {Blank single:19197::yes,no} Cough: {Blank single:19197::yes,no} Shortness of breath: {Blank single:19197::yes,no} Runny/stuffy nose: {Blank single:19197::yes,no} Itchy eyes: {Blank single:19197::yes,no} Beta-blocker use: {Blank single:19197::yes,no}  Patient/guardian was informed of the test procedure with verbalized understanding of the risk of anaphylaxis. Consent was signed.   Last antihistamine use: *** Last beta-blocker use: ***  Medication List:  Current Outpatient Medications  Medication Sig Dispense Refill   albuterol  (VENTOLIN  HFA) 108 (90 Base) MCG/ACT inhaler Inhale 2 puffs into the lungs every 4 (four) hours as needed for wheezing or shortness of breath. 18 g 5   Albuterol -Budesonide  (AIRSUPRA ) 90-80 MCG/ACT AERO Inhale 2 puffs into the lungs every 4 (four) hours  as needed (coughing, wheezing, chest tightness). Do not exceed 12 puffs in 24 hours. 10.7 g 2   Ascorbic Acid  (VITAMIN C WITH ROSE HIPS) 500 MG tablet Take 500 mg by mouth daily.     brimonidine (ALPHAGAN) 0.2 % ophthalmic solution Apply to eye.     budesonide -formoterol  (SYMBICORT ) 80-4.5 MCG/ACT inhaler Inhale 2 puffs into the lungs in the morning and at bedtime. 1 each 12   carboxymethylcellulose (REFRESH PLUS) 0.5 % SOLN Place 1 drop into both eyes daily as needed (dry eyes).     Cholecalciferol  (VITAMIN D -3) 125 MCG (5000 UT) TABS Take 10,000 Units by mouth daily.      EPINEPHrine  0.3 mg/0.3 mL IJ SOAJ injection Inject 0.3 mg into the muscle as needed for anaphylaxis. 1 each 1   ibuprofen  (ADVIL ) 200 MG tablet Take 200 mg by mouth every 6 (six) hours as needed for mild pain.     loratadine  (CLARITIN ) 10 MG tablet Take 10 mg by mouth daily as needed for allergies.     ondansetron  (ZOFRAN ) 8 MG tablet Take 1 tablet (8 mg total) by mouth every 8 (eight) hours as needed for nausea or vomiting. 20 tablet 0   progesterone  (PROMETRIUM ) 200 MG capsule Take 1 capsule (200 mg total) by mouth at bedtime. 90 capsule 1   Tafluprost , PF, (ZIOPTAN ) 0.0015 % SOLN Place 1 drop into both eyes daily.     valACYclovir (VALTREX) 500 MG tablet Take 500 mg by mouth daily as needed (breakouts).     vitamin E  400 UNIT capsule Take 400 Units by mouth daily.     No current facility-administered medications for this visit.    Allergies: Allergies  Allergen Reactions   Cefdinir Anaphylaxis and Shortness Of Breath  Clindamycin/Lincomycin Hives and Shortness Of Breath   Benzonatate  Hives and Itching    Pt developed  itching and  welt-like bumps on her  face, chest, back and arms.  She also developed eye burning . No worsening shortness of breath or lip/ tongue swelling. Symptoms resolved within 36 hours with immediate discontinuation of the medication and  Benadryl  and Zyrtec po    Cosopt [Dorzolamide Hcl-Timolol  Mal]     Lower extremity numbness, tingling   Dilaudid  [Hydromorphone  Hcl] Swelling and Other (See Comments)    Throat closing and tongue swelling   Fish Allergy     Peanut -Containing Drug Products    Shellfish Allergy     Coconut (Cocos Nucifera) Swelling and Rash   Hydrocodone Rash    I reviewed her past medical history, social history, family history, and environmental history and no significant changes have been reported from her previous visit.   Review of Systems  Constitutional:  Negative for appetite change, chills, fever and unexpected weight change.  HENT:  Negative for congestion and rhinorrhea.   Eyes:  Negative for itching.  Respiratory:  Negative for cough, chest tightness, shortness of breath and wheezing.   Cardiovascular:  Negative for chest pain.  Gastrointestinal:  Negative for abdominal pain.  Genitourinary:  Negative for difficulty urinating.  Skin:  Negative for rash.  Allergic/Immunologic: Positive for environmental allergies and food allergies.  Neurological:  Negative for headaches.    Objective: There were no vitals taken for this visit. There is no height or weight on file to calculate BMI. Physical Exam Vitals and nursing note reviewed.  Constitutional:      Appearance: Normal appearance. She is well-developed.  HENT:     Head: Normocephalic and atraumatic.     Right Ear: Tympanic membrane and external ear normal.     Left Ear: Tympanic membrane and external ear normal.     Nose: Nose normal.     Mouth/Throat:     Mouth: Mucous membranes are moist.     Pharynx: Oropharynx is clear.  Eyes:     Conjunctiva/sclera: Conjunctivae normal.  Cardiovascular:     Rate and Rhythm: Normal rate and regular rhythm.     Heart sounds: Normal heart sounds. No murmur heard.    No friction rub. No gallop.  Pulmonary:     Effort: Pulmonary effort is normal.     Breath sounds: Normal breath sounds. No wheezing, rhonchi or rales.  Musculoskeletal:     Cervical  back: Neck supple.  Skin:    General: Skin is warm.     Findings: No rash.  Neurological:     Mental Status: She is alert and oriented to person, place, and time.  Psychiatric:        Behavior: Behavior normal.     Diagnostics: Spirometry:  Tracings reviewed. Her effort: {Blank single:19197::Good reproducible efforts.,It was hard to get consistent efforts and there is a question as to whether this reflects a maximal maneuver.,Poor effort, data can not be interpreted.} FVC: ***L FEV1: ***L, ***% predicted FEV1/FVC ratio: ***% Interpretation: {Blank single:19197::Spirometry consistent with mild obstructive disease,Spirometry consistent with moderate obstructive disease,Spirometry consistent with severe obstructive disease,Spirometry consistent with possible restrictive disease,Spirometry consistent with mixed obstructive and restrictive disease,Spirometry uninterpretable due to technique,Spirometry consistent with normal pattern,No overt abnormalities noted given today's efforts}.  Please see scanned spirometry results for details.  Skin Testing: {Blank single:19197::None,Deferred due to recent antihistamines use}. Positive test to: ***. Negative test to: ***.  Results discussed with patient/family.   Previous  notes and tests were reviewed. The plan was reviewed with the patient/family, and all questions/concerned were addressed.  It was my pleasure to see Ashley Archer today and participate in her care. Please feel free to contact me with any questions or concerns.  Sincerely,  Orlan Cramp, DO Allergy  & Immunology  [facility]  [place] office: [phone] [facility] office: [phone]

## 2024-07-06 ENCOUNTER — Ambulatory Visit (INDEPENDENT_AMBULATORY_CARE_PROVIDER_SITE_OTHER): Admitting: Allergy

## 2024-07-06 ENCOUNTER — Encounter: Payer: Self-pay | Admitting: Allergy

## 2024-07-06 DIAGNOSIS — T7800XD Anaphylactic reaction due to unspecified food, subsequent encounter: Secondary | ICD-10-CM

## 2024-07-06 NOTE — Patient Instructions (Addendum)
 Food allergies Failed peanut  butter challenge. You were given zyrtec 10mg  and famotidine  40mg . Continue to avoid seafood, peanuts, tree nuts and coconut. For mild symptoms you can take over the counter antihistamines (zyrtec 10mg  to 20mg ) and monitor symptoms closely.  If symptoms worsen or if you have severe symptoms including breathing issues, throat closure, significant swelling, whole body hives, severe diarrhea and vomiting, lightheadedness then use epinephrine  and seek immediate medical care afterwards. Emergency action plan in place.  Discussed that her food triggered oral and throat symptoms are likely caused by oral food allergy  syndrome (OFAS). This is caused by cross reactivity of pollen with fresh fruits and vegetables, and nuts. Symptoms are usually localized in the form of itching and burning in mouth and throat. Very rarely it can progress to more severe symptoms. Eating foods in cooked or processed forms usually minimizes symptoms. I recommended avoidance of eating the problem foods, especially during the peak season(s). Sometimes, OFAS can induce severe throat swelling or even a systemic reaction; with such instance, I advised them to report to a local ER. A list of common pollens and food cross-reactivities was provided to the patient.    Environmental allergies 2025 skin testing positive to grass, ragweed, and trees. Borderline to weed mix.  Continue environmental control measures as below. Use over the counter antihistamines such as Zyrtec (cetirizine), Claritin  (loratadine ), Allegra (fexofenadine), or Xyzal  (levocetirizine) daily as needed. May take twice a day during allergy  flares. May switch antihistamines every few months. Recommend allergy  injections. 1 injection Let us  know when ready to start.  Had a detailed discussion with patient/family that clinical history is suggestive of allergic rhinitis, and may benefit from allergy  immunotherapy (AIT). Discussed in detail  regarding the dosing, schedule, side effects (mild to moderate local allergic reaction and rarely systemic allergic reactions including anaphylaxis), and benefits (significant improvement in nasal symptoms, seasonal flares of asthma) of immunotherapy with the patient. There is significant time commitment involved with allergy  shots, which includes weekly immunotherapy injections for first 9-12 months and then biweekly to monthly injections for 3-5 years.   Hives Avoid the following potential triggers: alcohol, tight clothing, NSAIDs, hot showers and getting overheated. Continue proper skin care.  At first onset start taking:  Start zyrtec (cetirizine) 10mg  OR allegra (fexofenadine) 180mg  twice a day. If symptoms are not controlled or causes drowsiness let us  know. Start Pepcid  (famotidine ) 20mg  twice a day.   Breathing May use Airsupra  rescue inhaler 2 puffs every 4 to 6 hours as needed for shortness of breath, chest tightness, coughing, and wheezing. Do not use more than 12 puffs in 24 hours. May use Airsupra  rescue inhaler 2 puffs 5 to 15 minutes prior to strenuous physical activities. Rinse mouth after each use.  Monitor frequency of use - if you need to use it more than twice per week on a consistent basis let us  know.   Heartburn Continue lifestyle and dietary modifications.  Drug allergies Continue to avoid medications on your allergy  list.   Return in about 4 months (around 11/06/2024). Or sooner if needed.    Reducing Pollen Exposure Pollen seasons: trees (spring), grass (summer) and ragweed/weeds (fall). Keep windows closed in your home and car to lower pollen exposure.  Install air conditioning in the bedroom and throughout the house if possible.  Avoid going out in dry windy days - especially early morning. Pollen counts are highest between 5 - 10 AM and on dry, hot and windy days.  Save outside activities for late  afternoon or after a heavy rain, when pollen levels are lower.   Avoid mowing of grass if you have grass pollen allergy . Be aware that pollen can also be transported indoors on people and pets.  Dry your clothes in an automatic dryer rather than hanging them outside where they might collect pollen.  Rinse hair and eyes before bedtime.  Skin care recommendations  Bath time: Always use lukewarm water. AVOID very hot or cold water. Keep bathing time to 5-10 minutes. Do NOT use bubble bath. Use a mild soap and use just enough to wash the dirty areas. Do NOT scrub skin vigorously.  After bathing, pat dry your skin with a towel. Do NOT rub or scrub the skin.  Moisturizers and prescriptions:  ALWAYS apply moisturizers immediately after bathing (within 3 minutes). This helps to lock-in moisture. Use the moisturizer several times a day over the whole body. Good summer moisturizers include: Aveeno, CeraVe, Cetaphil. Good winter moisturizers include: Aquaphor, Vaseline, Cerave, Cetaphil, Eucerin, Vanicream. When using moisturizers along with medications, the moisturizer should be applied about one hour after applying the medication to prevent diluting effect of the medication or moisturize around where you applied the medications. When not using medications, the moisturizer can be continued twice daily as maintenance.  Laundry and clothing: Avoid laundry products with added color or perfumes. Use unscented hypo-allergenic laundry products such as Tide free, Cheer free & gentle, and All free and clear.  If the skin still seems dry or sensitive, you can try double-rinsing the clothes. Avoid tight or scratchy clothing such as wool. Do not use fabric softeners or dyer sheets.

## 2024-07-21 NOTE — Progress Notes (Unsigned)
 Follow Up Note  RE: Ashley Archer MRN: [id number] DOB: 1971/07/07 Date of Office Visit: 07/22/2024  Referring provider: No ref. provider found Primary care provider: Patient, No Pcp Per  Chief Complaint: No chief complaint on file.  History of Present Illness: I had the pleasure of seeing Ashley Archer for a follow up visit at the [facility] on 07/22/2024. She is a 53 y.o. female, who is being followed for food allergy , allergic rhinitis, urticaria, asthma, heartburn and multiple drug allergies. Her previous allergy  office visit was on 07/06/2024 with Dr. Luke. Today is a regular follow up visit.  Discussed the use of AI scribe software for clinical note transcription with the patient, who gave verbal consent to proceed.  History of Present Illness            ***  Assessment and Plan: Ashley Archer is a 53 y.o. female with: Anaphylactic reaction due to food, subsequent encounter Failed peanut  butter challenge. Patient complained of headaches and then some indigestion symptoms with 1g of peanut  butter and with the repeated 1g dose.  Given zyrtec 10mg  and famotidine  40mg . Patient felt better post 1 hour monitor and vitals stable.  Continue to avoid seafood, peanuts, tree nuts and coconut. For mild symptoms you can take over the counter antihistamines (zyrtec 10mg  to 20mg ) and monitor symptoms closely.  If symptoms worsen or if you have severe symptoms including breathing issues, throat closure, significant swelling, whole body hives, severe diarrhea and vomiting, lightheadedness then use epinephrine  and seek immediate medical care afterwards. Emergency action plan in place. Will hold off any food challenges for now.   Anaphylactic reaction due to food, subsequent encounter Past history - Recent severe allergic reactions to seafood and nuts, including anaphylaxis. Possible cross-contamination and cross-reactivity considered. History of EpiPen  use and ER visit  noted. Interim history - 2025 labs negative to shellfish panel, fish panel, coconut, chocolate, wheat, gluten. Nut panel - negative to tree nuts and essentially negative to peanuts.  Today's skin testing negative to peanuts, tree nuts, coconut and seafood.  Continue to avoid seafood, peanuts, tree nuts and coconut. Don't eat these foods until food challenge is done.  For mild symptoms you can take over the counter antihistamines (zyrtec 10mg  to 20mg ) and monitor symptoms closely.  If symptoms worsen or if you have severe symptoms including breathing issues, throat closure, significant swelling, whole body hives, severe diarrhea and vomiting, lightheadedness then use epinephrine  and seek immediate medical care afterwards. Emergency action plan in place. If interested we can schedule food challenge to tree nut butter first, then peanut  butter then coconut milk.     Other allergic rhinitis Past history - 2019 skin testing positive to grass, weed, trees, ragweed. Today's skin testing positive to grass, ragweed, and trees. Borderline to weed mix.  Start environmental control measures as below. Use over the counter antihistamines such as Zyrtec (cetirizine), Claritin  (loratadine ), Allegra (fexofenadine), or Xyzal  (levocetirizine) daily as needed. May take twice a day during allergy  flares. May switch antihistamines every few months. Consider allergy  injections for long term control if above medications do not help the symptoms - handout given.    Urticaria Past history - Intermittent hives triggered by fragrances and outdoor exposure, managed with daily antihistamines. Interim history - 2025 labs unremarkable except for ANA titer of 1:80.  Avoid the following potential triggers: alcohol, tight clothing, NSAIDs, hot showers and getting overheated. Continue proper skin care.  At first onset start taking:  Start zyrtec (cetirizine)  10mg  OR allegra (fexofenadine) 180mg  twice a day. If symptoms are not  controlled or causes drowsiness let us  know. Start Pepcid  (famotidine ) 20mg  twice a day.    Mild intermittent asthma without complication Past history - developed asthma like symptoms post-Covid infection. Only uses albuterol /Symbicort  as needed. Airsupra  suggested to simplify inhaler regimen. 2025 spirometry normal.  May use Airsupra  rescue inhaler 2 puffs every 4 to 6 hours as needed for shortness of breath, chest tightness, coughing, and wheezing. Do not use more than 12 puffs in 24 hours. May use Airsupra  rescue inhaler 2 puffs 5 to 15 minutes prior to strenuous physical activities. Rinse mouth after each use.  Monitor frequency of use - if you need to use it more than twice per week on a consistent basis let us  know.    Heartburn Continue lifestyle and dietary modifications.   Multiple drug allergies Continue to avoid medications on your allergy  list.   Assessment and Plan              No follow-ups on file.  No orders of the defined types were placed in this encounter.  Lab Orders  No laboratory test(s) ordered today    Diagnostics: Spirometry:  Tracings reviewed. Her effort: {Blank single:19197::Good reproducible efforts.,It was hard to get consistent efforts and there is a question as to whether this reflects a maximal maneuver.,Poor effort, data can not be interpreted.} FVC: ***L FEV1: ***L, ***% predicted FEV1/FVC ratio: ***% Interpretation: {Blank single:19197::Spirometry consistent with mild obstructive disease,Spirometry consistent with moderate obstructive disease,Spirometry consistent with severe obstructive disease,Spirometry consistent with possible restrictive disease,Spirometry consistent with mixed obstructive and restrictive disease,Spirometry uninterpretable due to technique,Spirometry consistent with normal pattern,No overt abnormalities noted given today's efforts}.  Please see scanned spirometry results for details.  Skin Testing:  {Blank single:19197::Select foods,Environmental allergy  panel,Environmental allergy  panel and select foods,Food allergy  panel,None,Deferred due to recent antihistamines use}. *** Results discussed with patient/family.   Medication List:  Current Outpatient Medications  Medication Sig Dispense Refill  . albuterol  (VENTOLIN  HFA) 108 (90 Base) MCG/ACT inhaler Inhale 2 puffs into the lungs every 4 (four) hours as needed for wheezing or shortness of breath. 18 g 5  . Albuterol -Budesonide  (AIRSUPRA ) 90-80 MCG/ACT AERO Inhale 2 puffs into the lungs every 4 (four) hours as needed (coughing, wheezing, chest tightness). Do not exceed 12 puffs in 24 hours. 10.7 g 2  . Ascorbic Acid  (VITAMIN C WITH ROSE HIPS) 500 MG tablet Take 500 mg by mouth daily.    . brimonidine (ALPHAGAN) 0.2 % ophthalmic solution Apply to eye.    . budesonide -formoterol  (SYMBICORT ) 80-4.5 MCG/ACT inhaler Inhale 2 puffs into the lungs in the morning and at bedtime. 1 each 12  . carboxymethylcellulose (REFRESH PLUS) 0.5 % SOLN Place 1 drop into both eyes daily as needed (dry eyes).    . Cholecalciferol  (VITAMIN D -3) 125 MCG (5000 UT) TABS Take 10,000 Units by mouth daily.     . EPINEPHrine  0.3 mg/0.3 mL IJ SOAJ injection Inject 0.3 mg into the muscle as needed for anaphylaxis. 1 each 1  . ibuprofen  (ADVIL ) 200 MG tablet Take 200 mg by mouth every 6 (six) hours as needed for mild pain.    . loratadine  (CLARITIN ) 10 MG tablet Take 10 mg by mouth daily as needed for allergies.    . ondansetron  (ZOFRAN ) 8 MG tablet Take 1 tablet (8 mg total) by mouth every 8 (eight) hours as needed for nausea or vomiting. 20 tablet 0  . progesterone  (PROMETRIUM ) 200 MG capsule  Take 1 capsule (200 mg total) by mouth at bedtime. 90 capsule 1  . Tafluprost , PF, (ZIOPTAN ) 0.0015 % SOLN Place 1 drop into both eyes daily.    . valACYclovir (VALTREX) 500 MG tablet Take 500 mg by mouth daily as needed (breakouts).    . vitamin E  400 UNIT capsule Take  400 Units by mouth daily.     No current facility-administered medications for this visit.   Allergies: Allergies  Allergen Reactions  . Cefdinir Anaphylaxis and Shortness Of Breath  . Clindamycin/Lincomycin Hives and Shortness Of Breath  . Benzonatate  Hives and Itching    Pt developed  itching and  welt-like bumps on her  face, chest, back and arms.  She also developed eye burning . No worsening shortness of breath or lip/ tongue swelling. Symptoms resolved within 36 hours with immediate discontinuation of the medication and  Benadryl  and Zyrtec po   . Cosopt [Dorzolamide Hcl-Timolol Mal]     Lower extremity numbness, tingling  . Dilaudid  [Hydromorphone  Hcl] Swelling and Other (See Comments)    Throat closing and tongue swelling  . Fish Allergy    . Peanut -Containing Drug Products   . Shellfish Allergy    . Coconut (Cocos Nucifera) Swelling and Rash  . Hydrocodone Rash   I reviewed her past medical history, social history, family history, and environmental history and no significant changes have been reported from her previous visit.  Review of Systems  Constitutional:  Negative for appetite change, chills, fever and unexpected weight change.  HENT:  Negative for congestion and rhinorrhea.   Eyes:  Negative for itching.  Respiratory:  Negative for cough, chest tightness, shortness of breath and wheezing.   Cardiovascular:  Negative for chest pain.  Gastrointestinal:  Negative for abdominal pain.  Genitourinary:  Negative for difficulty urinating.  Skin:  Negative for rash.       pruritus  Allergic/Immunologic: Positive for environmental allergies and food allergies.  Neurological:  Positive for headaches.    Objective: There were no vitals taken for this visit. There is no height or weight on file to calculate BMI. Physical Exam Vitals and nursing note reviewed.  Constitutional:      Appearance: Normal appearance. She is well-developed.  HENT:     Head: Normocephalic and  atraumatic.     Right Ear: Tympanic membrane and external ear normal.     Left Ear: Tympanic membrane and external ear normal.     Nose: Nose normal.     Mouth/Throat:     Mouth: Mucous membranes are moist.     Pharynx: Oropharynx is clear.  Eyes:     Conjunctiva/sclera: Conjunctivae normal.  Cardiovascular:     Rate and Rhythm: Normal rate and regular rhythm.     Heart sounds: Normal heart sounds. No murmur heard.    No friction rub. No gallop.  Pulmonary:     Effort: Pulmonary effort is normal.     Breath sounds: Normal breath sounds. No wheezing, rhonchi or rales.  Musculoskeletal:     Cervical back: Neck supple.  Skin:    General: Skin is warm.     Findings: No rash.  Neurological:     Mental Status: She is alert and oriented to person, place, and time.  Psychiatric:        Behavior: Behavior normal.   Previous notes and tests were reviewed. The plan was reviewed with the patient/family, and all questions/concerned were addressed.  It was my pleasure to see Ashley Archer today and participate in her  care. Please feel free to contact me with any questions or concerns.  Sincerely,  Orlan Cramp, DO Allergy  & Immunology  [facility]  [place] office: [phone] [facility] office: [phone]

## 2024-07-22 ENCOUNTER — Other Ambulatory Visit: Payer: Self-pay

## 2024-07-22 ENCOUNTER — Encounter: Payer: Self-pay | Admitting: Allergy

## 2024-07-22 ENCOUNTER — Ambulatory Visit: Admitting: Allergy

## 2024-07-22 VITALS — BP 106/78 | HR 58 | Temp 98.3°F | Resp 16 | Ht 62.0 in | Wt 171.2 lb

## 2024-07-22 DIAGNOSIS — T7800XD Anaphylactic reaction due to unspecified food, subsequent encounter: Secondary | ICD-10-CM | POA: Diagnosis not present

## 2024-07-22 DIAGNOSIS — R12 Heartburn: Secondary | ICD-10-CM | POA: Diagnosis not present

## 2024-07-22 DIAGNOSIS — Z889 Allergy status to unspecified drugs, medicaments and biological substances status: Secondary | ICD-10-CM

## 2024-07-22 DIAGNOSIS — J4520 Mild intermittent asthma, uncomplicated: Secondary | ICD-10-CM

## 2024-07-22 DIAGNOSIS — J301 Allergic rhinitis due to pollen: Secondary | ICD-10-CM

## 2024-07-22 DIAGNOSIS — L509 Urticaria, unspecified: Secondary | ICD-10-CM

## 2024-07-22 NOTE — Patient Instructions (Addendum)
 Food allergies Failed peanut  butter challenge. Continue to avoid seafood, peanuts, tree nuts and coconut. For mild symptoms you can take over the counter antihistamines (zyrtec 10mg  to 20mg ) and monitor symptoms closely.  If symptoms worsen or if you have severe symptoms including breathing issues, throat closure, significant swelling, whole body hives, severe diarrhea and vomiting, lightheadedness then use epinephrine  and seek immediate medical care afterwards. Emergency action plan in place.  If you start having more episodes then: Start zyrtec (cetirizine) 10mg  OR allegra (fexofenadine) 180mg  twice a day. If symptoms are not controlled or causes drowsiness let us  know. Start Pepcid  (famotidine ) 20mg  twice a day.   Discussed that her food triggered oral and throat symptoms are likely caused by oral food allergy  syndrome (OFAS). This is caused by cross reactivity of pollen with fresh fruits and vegetables, and nuts. Symptoms are usually localized in the form of itching and burning in mouth and throat. Very rarely it can progress to more severe symptoms. Eating foods in cooked or processed forms usually minimizes symptoms. I recommended avoidance of eating the problem foods, especially during the peak season(s). Sometimes, OFAS can induce severe throat swelling or even a systemic reaction; with such instance, I advised them to report to a local ER. A list of common pollens and food cross-reactivities was provided to the patient.   Environmental allergies 2025 skin testing positive to grass, ragweed, and trees. Borderline to weed mix.  Continue environmental control measures as below. Use over the counter antihistamines such as Zyrtec (cetirizine), Claritin  (loratadine ), Allegra (fexofenadine), or Xyzal  (levocetirizine) daily as needed. May take twice a day during allergy  flares. May switch antihistamines every few months. Recommend allergy  injections. 1 injection Let us  know when ready to start.   Had a detailed discussion with patient/family that clinical history is suggestive of allergic rhinitis, and may benefit from allergy  immunotherapy (AIT). Discussed in detail regarding the dosing, schedule, side effects (mild to moderate local allergic reaction and rarely systemic allergic reactions including anaphylaxis), and benefits (significant improvement in nasal symptoms, seasonal flares of asthma) of immunotherapy with the patient. There is significant time commitment involved with allergy  shots, which includes weekly immunotherapy injections for first 9-12 months and then biweekly to monthly injections for 3-5 years.   Hives Avoid the following potential triggers: alcohol, tight clothing, NSAIDs, hot showers and getting overheated. Continue proper skin care.   Breathing Normal breathing test today. May use Airsupra  rescue inhaler 2 puffs every 4 to 6 hours as needed for shortness of breath, chest tightness, coughing, and wheezing. Do not use more than 12 puffs in 24 hours. May use Airsupra  rescue inhaler 2 puffs 5 to 15 minutes prior to strenuous physical activities. Rinse mouth after each use.  Monitor frequency of use - if you need to use it more than twice per week on a consistent basis let us  know.   Heartburn Continue lifestyle and dietary modifications.  Drug allergies Continue to avoid medications on your allergy  list.   Return in about 4 months (around 11/22/2024). Or sooner if needed.    Reducing Pollen Exposure Pollen seasons: trees (spring), grass (summer) and ragweed/weeds (fall). Keep windows closed in your home and car to lower pollen exposure.  Install air conditioning in the bedroom and throughout the house if possible.  Avoid going out in dry windy days - especially early morning. Pollen counts are highest between 5 - 10 AM and on dry, hot and windy days.  Save outside activities for late afternoon or after  a heavy rain, when pollen levels are lower.  Avoid mowing  of grass if you have grass pollen allergy . Be aware that pollen can also be transported indoors on people and pets.  Dry your clothes in an automatic dryer rather than hanging them outside where they might collect pollen.  Rinse hair and eyes before bedtime.  Skin care recommendations  Bath time: Always use lukewarm water. AVOID very hot or cold water. Keep bathing time to 5-10 minutes. Do NOT use bubble bath. Use a mild soap and use just enough to wash the dirty areas. Do NOT scrub skin vigorously.  After bathing, pat dry your skin with a towel. Do NOT rub or scrub the skin.  Moisturizers and prescriptions:  ALWAYS apply moisturizers immediately after bathing (within 3 minutes). This helps to lock-in moisture. Use the moisturizer several times a day over the whole body. Good summer moisturizers include: Aveeno, CeraVe, Cetaphil. Good winter moisturizers include: Aquaphor, Vaseline, Cerave, Cetaphil, Eucerin, Vanicream. When using moisturizers along with medications, the moisturizer should be applied about one hour after applying the medication to prevent diluting effect of the medication or moisturize around where you applied the medications. When not using medications, the moisturizer can be continued twice daily as maintenance.  Laundry and clothing: Avoid laundry products with added color or perfumes. Use unscented hypo-allergenic laundry products such as Tide free, Cheer free & gentle, and All free and clear.  If the skin still seems dry or sensitive, you can try double-rinsing the clothes. Avoid tight or scratchy clothing such as wool. Do not use fabric softeners or dyer sheets.

## 2024-09-04 ENCOUNTER — Other Ambulatory Visit: Payer: Self-pay | Admitting: Allergy

## 2024-09-04 DIAGNOSIS — J3089 Other allergic rhinitis: Secondary | ICD-10-CM

## 2024-09-14 NOTE — Progress Notes (Signed)
 WILL MAKE CLOSER TO APPT TIME

## 2024-09-14 NOTE — Progress Notes (Signed)
 Aeroallergen Immunotherapy  Ordering Provider: Orlan Cramp, DO  Patient Details Name: Ashley Archer MRN: [id number] Date of Birth: 1970-10-19  Order 1 of 1  Vial Label: G-RW-W-T  0.3 ml (Volume)  BAU Concentration -- 7 Grass Mix* 100,000 ([place], [place], [place], [place]rennial [place], [place], Sweet [place], [place]) 0.2 ml (Volume)  1:20 Concentration -- [place] 0.3 ml (Volume)  BAU Concentration -- [place] 10,000 0.2 ml (Volume)  1:20 Concentration -- [place] 0.3 ml (Volume)  1:20 Concentration -- Ragweed Mix 0.5 ml (Volume)  1:20 Concentration -- Weed Mix* 0.5 ml (Volume)  1:20 Concentration -- Eastern 10 Tree Mix* 0.2 ml (Volume)  1:20 Concentration -- Walnut, Black Pollen   2.5  ml Extract Subtotal 2.5  ml Diluent  5.0  ml Maintenance Total  Schedule:  B Silver Vial (1:10,000): Schedule B (6 doses) Green Vial (1:1,000): Schedule B (6 doses) Blue Vial (1:100): Schedule B (6 doses) Yellow Vial (1:10): Schedule B (6 doses) Red Vial (1:1): Schedule A (14 doses)  Special Instructions: May come in 1-2 times a week during build up as tolerated. Once a week on red vial. Once on red vial #1 0.5cc go every 2 weeks, on red vial #2 0.5cc go every 4 weeks. May build up red vials faster (0.1, 0.3, 0.5).

## 2024-09-29 ENCOUNTER — Other Ambulatory Visit: Payer: Self-pay | Admitting: Obstetrics and Gynecology

## 2024-09-29 DIAGNOSIS — Z1231 Encounter for screening mammogram for malignant neoplasm of breast: Secondary | ICD-10-CM

## 2024-10-05 DIAGNOSIS — J301 Allergic rhinitis due to pollen: Secondary | ICD-10-CM | POA: Diagnosis not present

## 2024-10-05 NOTE — Progress Notes (Signed)
 VIAL MADE ON 10/05/24

## 2024-10-06 ENCOUNTER — Ambulatory Visit (INDEPENDENT_AMBULATORY_CARE_PROVIDER_SITE_OTHER): Admitting: *Deleted

## 2024-10-06 DIAGNOSIS — J302 Other seasonal allergic rhinitis: Secondary | ICD-10-CM

## 2024-10-06 DIAGNOSIS — J3089 Other allergic rhinitis: Secondary | ICD-10-CM | POA: Diagnosis not present

## 2024-10-06 NOTE — Progress Notes (Signed)
"                                                                        Immunotherapy   Patient Details  Name: Ashley Archer MRN: [id number] Date of Birth: 02/02/1971  10/06/2024  Ashley Archer started injections for  G-RW-W-T Following schedule: B  Frequency:2 times per week Epi-Pen:Epi-Pen Available  Consent signed and patient instructions given. Patient started allergy  injections and received 0.72mL of G-RW-W-T in the [facility]. Patient waited 30 minutes in office and did not experience any issues.   Latif Nazareno Fernandez-Vernon 10/06/2024, 3:38 PM   "

## 2024-10-15 ENCOUNTER — Ambulatory Visit

## 2024-10-20 ENCOUNTER — Ambulatory Visit
Admission: RE | Admit: 2024-10-20 | Discharge: 2024-10-20 | Disposition: A | Source: Ambulatory Visit | Attending: Obstetrics and Gynecology | Admitting: Obstetrics and Gynecology

## 2024-10-20 DIAGNOSIS — Z1231 Encounter for screening mammogram for malignant neoplasm of breast: Secondary | ICD-10-CM

## 2024-10-21 ENCOUNTER — Other Ambulatory Visit: Payer: Self-pay | Admitting: Obstetrics and Gynecology

## 2024-10-21 DIAGNOSIS — N644 Mastodynia: Secondary | ICD-10-CM

## 2024-10-29 ENCOUNTER — Ambulatory Visit
Admission: RE | Admit: 2024-10-29 | Discharge: 2024-10-29 | Disposition: A | Discharge status: Home / Self Care | Source: Ambulatory Visit | Attending: Obstetrics and Gynecology | Admitting: Obstetrics and Gynecology

## 2024-10-29 ENCOUNTER — Other Ambulatory Visit: Payer: Self-pay | Admitting: Obstetrics and Gynecology

## 2024-10-29 DIAGNOSIS — N644 Mastodynia: Secondary | ICD-10-CM

## 2024-11-02 ENCOUNTER — Ambulatory Visit: Admitting: Allergy

## 2024-11-10 ENCOUNTER — Ambulatory Visit: Admitting: Allergy
